# Patient Record
Sex: Male | Born: 1940 | Race: White | Hispanic: No | Marital: Married | Smoking: Current every day smoker
Health system: Southern US, Community
[De-identification: ages and names within clinical notes are randomized; demographics above are authoritative.]

## PROBLEM LIST (undated history)

## (undated) DIAGNOSIS — I739 Peripheral vascular disease, unspecified: Secondary | ICD-10-CM

## (undated) DIAGNOSIS — I779 Disorder of arteries and arterioles, unspecified: Secondary | ICD-10-CM

## (undated) DIAGNOSIS — I2510 Atherosclerotic heart disease of native coronary artery without angina pectoris: Secondary | ICD-10-CM

## (undated) DIAGNOSIS — I10 Essential (primary) hypertension: Secondary | ICD-10-CM

## (undated) DIAGNOSIS — K219 Gastro-esophageal reflux disease without esophagitis: Secondary | ICD-10-CM

## (undated) DIAGNOSIS — E785 Hyperlipidemia, unspecified: Secondary | ICD-10-CM

## (undated) DIAGNOSIS — J449 Chronic obstructive pulmonary disease, unspecified: Secondary | ICD-10-CM

## (undated) HISTORY — DX: Essential (primary) hypertension: I10

## (undated) HISTORY — DX: Peripheral vascular disease, unspecified: I73.9

## (undated) HISTORY — PX: OTHER SURGICAL HISTORY: SHX169

## (undated) HISTORY — PX: FRACTURE SURGERY: SHX138

## (undated) HISTORY — DX: Disorder of arteries and arterioles, unspecified: I77.9

## (undated) HISTORY — DX: Chronic obstructive pulmonary disease, unspecified: J44.9

## (undated) HISTORY — DX: Atherosclerotic heart disease of native coronary artery without angina pectoris: I25.10

## (undated) HISTORY — DX: Hyperlipidemia, unspecified: E78.5

---

## 1998-12-24 ENCOUNTER — Inpatient Hospital Stay (HOSPITAL_COMMUNITY): Admission: AD | Admit: 1998-12-24 | Discharge: 1998-12-26 | Payer: Self-pay | Admitting: Internal Medicine

## 1999-12-22 ENCOUNTER — Inpatient Hospital Stay (HOSPITAL_COMMUNITY): Admission: EM | Admit: 1999-12-22 | Discharge: 1999-12-24 | Payer: Self-pay | Admitting: Internal Medicine

## 2002-01-05 ENCOUNTER — Encounter: Payer: Self-pay | Admitting: Vascular Surgery

## 2002-01-08 ENCOUNTER — Encounter (INDEPENDENT_AMBULATORY_CARE_PROVIDER_SITE_OTHER): Payer: Self-pay | Admitting: Specialist

## 2002-01-08 ENCOUNTER — Inpatient Hospital Stay (HOSPITAL_COMMUNITY): Admission: RE | Admit: 2002-01-08 | Discharge: 2002-01-09 | Payer: Self-pay | Admitting: Vascular Surgery

## 2007-02-18 HISTORY — PX: CARDIAC CATHETERIZATION: SHX172

## 2007-02-23 ENCOUNTER — Ambulatory Visit: Payer: Self-pay | Admitting: Cardiology

## 2007-02-24 ENCOUNTER — Encounter: Payer: Self-pay | Admitting: Physician Assistant

## 2007-03-01 ENCOUNTER — Inpatient Hospital Stay (HOSPITAL_BASED_OUTPATIENT_CLINIC_OR_DEPARTMENT_OTHER): Admission: RE | Admit: 2007-03-01 | Discharge: 2007-03-01 | Payer: Self-pay | Admitting: Cardiology

## 2007-03-01 ENCOUNTER — Ambulatory Visit: Payer: Self-pay | Admitting: Cardiology

## 2007-03-22 ENCOUNTER — Ambulatory Visit: Payer: Self-pay | Admitting: Cardiology

## 2007-08-01 ENCOUNTER — Encounter: Payer: Self-pay | Admitting: Cardiology

## 2007-10-19 ENCOUNTER — Ambulatory Visit: Payer: Self-pay | Admitting: Cardiology

## 2008-04-19 HISTORY — PX: CAROTID ENDARTERECTOMY: SUR193

## 2008-05-08 ENCOUNTER — Ambulatory Visit: Payer: Self-pay

## 2008-06-25 ENCOUNTER — Ambulatory Visit: Payer: Self-pay | Admitting: Vascular Surgery

## 2008-06-27 ENCOUNTER — Ambulatory Visit: Payer: Self-pay | Admitting: Physician Assistant

## 2008-06-27 ENCOUNTER — Encounter: Payer: Self-pay | Admitting: Cardiology

## 2008-07-05 ENCOUNTER — Inpatient Hospital Stay (HOSPITAL_COMMUNITY): Admission: RE | Admit: 2008-07-05 | Discharge: 2008-07-06 | Payer: Self-pay | Admitting: Vascular Surgery

## 2008-07-05 ENCOUNTER — Ambulatory Visit: Payer: Self-pay | Admitting: Vascular Surgery

## 2008-07-05 ENCOUNTER — Encounter: Payer: Self-pay | Admitting: Vascular Surgery

## 2008-07-16 ENCOUNTER — Ambulatory Visit: Payer: Self-pay | Admitting: Vascular Surgery

## 2009-01-07 ENCOUNTER — Ambulatory Visit: Payer: Self-pay | Admitting: Vascular Surgery

## 2009-04-16 ENCOUNTER — Encounter: Payer: Self-pay | Admitting: Cardiology

## 2009-04-17 ENCOUNTER — Encounter: Payer: Self-pay | Admitting: Cardiology

## 2009-07-01 ENCOUNTER — Ambulatory Visit: Payer: Self-pay | Admitting: Vascular Surgery

## 2009-07-24 ENCOUNTER — Encounter: Payer: Self-pay | Admitting: Cardiology

## 2009-08-25 ENCOUNTER — Ambulatory Visit: Payer: Self-pay | Admitting: Cardiology

## 2009-08-25 DIAGNOSIS — F17200 Nicotine dependence, unspecified, uncomplicated: Secondary | ICD-10-CM

## 2009-08-25 DIAGNOSIS — Z9889 Other specified postprocedural states: Secondary | ICD-10-CM

## 2009-08-25 DIAGNOSIS — I739 Peripheral vascular disease, unspecified: Secondary | ICD-10-CM | POA: Insufficient documentation

## 2009-08-25 DIAGNOSIS — E785 Hyperlipidemia, unspecified: Secondary | ICD-10-CM | POA: Insufficient documentation

## 2009-08-25 DIAGNOSIS — I2510 Atherosclerotic heart disease of native coronary artery without angina pectoris: Secondary | ICD-10-CM

## 2010-05-19 NOTE — Assessment & Plan Note (Signed)
Summary: f/u 52yr [place]   Visit Type:  Follow-up Primary Provider:  [name]  CC:  follow-up visit.  History of Present Illness: the patient is a 70 year old male with history of coronary artery disease, peripheral vascular disease, status post bilateral carotid endarterectomies. The patient has a prolonged history of smoking and he continues to do so. He has a chronic cough in symptoms consistent with chronic emphysema/COPD. He uses his Combivent inhaler regularly. He denies however any chest pain palpitations or syncope. He reports no orthopnea PND.  Preventive Screening-Counseling & Management  Alcohol-Tobacco     Smoking Status: current     Smoking Cessation Counseling: yes     Packs/Day: 1 PPD  Current Medications (verified): 1)  Isosorbide Mononitrate Cr 30 Mg Xr24h-Tab (Isosorbide Mononitrate) .... Take 1 Tablet By Mouth Once A Day 2)  Aspir-Trin 325 Mg Tbec (Aspirin) .... Take 1 Tablet By Mouth Once A Day 3)  Simvastatin 40 Mg Tabs (Simvastatin) .... Take 1 Tablet By Mouth Once A Day 4)  Lisinopril 10 Mg Tabs (Lisinopril) .... Take 1 Tablet By Mouth Once A Day 5)  Advair Diskus 250-50 Mcg/dose Aepb (Fluticasone-Salmeterol) .... One Inhalation Two Times A Day 6)  Fish Oil 1000 Mg Caps (Omega-3 Fatty Acids) .... Take 1 Tablet By Mouth Once A Day 7)  Nitrostat 0.4 Mg Subl (Nitroglycerin) .... Use As Directed 8)  Famotidine 20 Mg Tabs (Famotidine) .... Take 1 Tablet By Mouth Once A Day As Needed 9)  Gabapentin 300 Mg Caps (Gabapentin) .... As Needed 10)  Combivent 18-103 Mcg/act Aero (Ipratropium-Albuterol) .... As Needed  Allergies (verified): 1)  ! Advil  Comments:  Nurse/Medical Assistant: The patient's medications and allergies were reviewed with the patient and were updated in the Medication and Allergy Lists. List reviewed.  Past History:  Family History: Last updated: 08/25/2009 noncontributory  Social History: Last updated: 08/25/2009 Tobacco Use - Yes.   Risk  Factors: Smoking Status: current (08/25/2009) Packs/Day: 1 PPD (08/25/2009)  Past Medical History: coronary artery disease Status post stent placement to the right coronary artery in 2000 and 2001 Obstructive coronary artery disease in November 2008 by catheterization Normal LV function Hypertension Hyperlipidemia COPD Carotid artery disease status post bilateral carotid endarterectomy. COPD  Family History: Reviewed history and no changes required. noncontributory  Social History: Tobacco Use - Yes.  Smoking Status:  current Packs/Day:  1 PPD  Review of Systems       The patient complains of shortness of breath and prolonged cough.  The patient denies fatigue, malaise, fever, weight gain/loss, vision loss, decreased hearing, hoarseness, chest pain, palpitations, wheezing, sleep apnea, coughing up blood, abdominal pain, blood in stool, nausea, vomiting, diarrhea, heartburn, incontinence, blood in urine, muscle weakness, joint pain, leg swelling, rash, skin lesions, headache, fainting, dizziness, depression, anxiety, enlarged lymph nodes, easy bruising or bleeding, and environmental allergies.    Vital Signs:  Patient profile:   70 year old male Height:      74 inches Weight:      185 pounds BMI:     23.84 Pulse rate:   71 / minute BP sitting:   120 / 69  (left arm) Cuff size:   large  Vitals Entered By: Carlye Grippe (Aug 25, 2009 9:32 AM) CC: follow-up visit   Physical Exam  Additional Exam:  General: Well-developed, well-nourished in no distress head: Normocephalic and atraumatic eyes PERRLA/EOMI intact, conjunctiva and lids normal nose: No deformity or lesions mouth normal dentition, normal posterior pharynx neck: Supple, no  JVD.  No masses, thyromegaly or abnormal cervical nodes lungs: decreased breath sounds bilaterally without wheezing.  Normal percussion heart: regular rate and rhythm with normal S1 and S2, no S3 or S4.  PMI is normal.  No pathological  murmurs abdomen: Normal bowel sounds, abdomen is soft and nontender without masses, organomegaly or hernias noted.  No hepatosplenomegaly musculoskeletal: Back normal, normal gait muscle strength and tone normal pulsus: Pulse is normal in all 4 extremities Extremities: No peripheral pitting edema neurologic: Alert and oriented x 3 skin: Intact without lesions or rashes cervical nodes: No significant adenopathy psychologic: Normal affect    EKG  Procedure date:  08/25/2009  Findings:      normal sinus rhythm. Normal EKG heart rate 72 beats per minute  Impression & Recommendations:  Problem # 1:  CAD (ICD-414.00) the patient is due for a Cardiolite imaging study next year. His last catheterization was in 2008. He reports no symptoms consistent with angina. His updated medication list for this problem includes:    Isosorbide Mononitrate Cr 30 Mg Xr24h-tab (Isosorbide mononitrate) .Marland Kitchen... Take 1 tablet by mouth once a day    Aspir-trin 325 Mg Tbec (Aspirin) .Marland Kitchen... Take 1 tablet by mouth once a day    Lisinopril 10 Mg Tabs (Lisinopril) .Marland Kitchen... Take 1 tablet by mouth once a day    Nitrostat 0.4 Mg Subl (Nitroglycerin) ..... Use as directed  Orders: EKG w/ Interpretation (93000)  Problem # 2:  PVD (ICD-443.9) the patient will be scheduled for a abdominal ultrasound to rule out aneurysm on his next clinic visit  Problem # 3:  CAROTID ENDARTERECTOMY, BILATERAL, HX OF (ICD-V15.1) the patient's followup increase for a vascular surgery  Problem # 4:  TOBACCO ABUSE (ICD-305.1) I counseled the patient about his tobacco use but he states it is not interested in quitting smoking.  Problem # 5:  DYSLIPIDEMIA (ICD-272.4) follow by Dr. [name]. His updated medication list for this problem includes:    Simvastatin 40 Mg Tabs (Simvastatin) .Marland Kitchen... Take 1 tablet by mouth once a day   Patient Instructions: 1)  Your physician wants you to follow-up in: 1 year. You will receive a reminder letter in  the mail one-two months in advance. If you don't receive a letter, please call our office to schedule the follow-up appointment. 2)  Your physician recommends that you continue on your current medications as directed. Please refer to the Current Medication list given to you today.

## 2010-05-19 NOTE — Letter (Signed)
Summary: [facility] H[facility]P/ D/C DR. PARSONS  [facility] D/C DR. PARSONS   Imported By: Zachary George 08/25/2009 09:26:13  _____________________________________________________________________  External Attachment:    Type:   Image     Comment:   External Document

## 2010-05-19 NOTE — Letter (Signed)
Summary: External Correspondence/ PROGRESS NOTE DR. TYSINGER  External Correspondence/ PROGRESS NOTE DR. TYSINGER   Imported By: Dorise Hiss 07/28/2009 13:52:04  _____________________________________________________________________  External Attachment:    Type:   Image     Comment:   External Document

## 2010-07-14 ENCOUNTER — Other Ambulatory Visit: Payer: Self-pay

## 2010-07-22 ENCOUNTER — Other Ambulatory Visit: Payer: Self-pay | Admitting: Cardiology

## 2010-07-23 ENCOUNTER — Other Ambulatory Visit (INDEPENDENT_AMBULATORY_CARE_PROVIDER_SITE_OTHER): Payer: Medicare Other

## 2010-07-23 DIAGNOSIS — I6529 Occlusion and stenosis of unspecified carotid artery: Secondary | ICD-10-CM

## 2010-07-23 DIAGNOSIS — Z48812 Encounter for surgical aftercare following surgery on the circulatory system: Secondary | ICD-10-CM

## 2010-07-28 NOTE — Procedures (Unsigned)
CAROTID DUPLEX EXAM  INDICATION:  Follow up carotid artery disease.  HISTORY: Diabetes:  No. Cardiac:  MI. Hypertension:  No. Smoking:  Yes. Previous Surgery:  Right carotid endarterectomy, 07/05/2008, left carotid endarterectomy. CV History:  Currently asymptomatic. Amaurosis Fugax No, Paresthesias No, Hemiparesis No.                                      RIGHT             LEFT Brachial systolic pressure:         110               109 Brachial Doppler waveforms:         Normal            Normal Vertebral direction of flow:        Antegrade         Antegrade DUPLEX VELOCITIES (cm/sec) CCA peak systolic                   57                78 ECA peak systolic                   71                64 ICA peak systolic                   113               92 ICA end diastolic                   40                34 PLAQUE MORPHOLOGY:                  Homogenous        Heterogenous PLAQUE AMOUNT:                      Mild              Mild PLAQUE LOCATION:                    Bifurcation       Bifurcation  IMPRESSION: 1. Patent bilateral carotid endarterectomy sites with no evidence of     restenosis of the internal carotid artery. 2. Mild intimal hyperplasia noted at the bilateral carotid     endarterectomy sites. 3. Antegrade flow in bilateral vertebral arteries.  ___________________________________________ Quita Skye [name], M.D.  EM/MEDQ  D:  07/23/2010  T:  07/23/2010  Job:  [id number]

## 2010-07-30 LAB — BLOOD GAS, ARTERIAL
Acid-Base Excess: 1.6 mmol/L (ref 0.0–2.0)
Drawn by: 206361
FIO2: 0.21 %
O2 Saturation: 96 %
Patient temperature: 98.6
pO2, Arterial: 76 mmHg — ABNORMAL LOW (ref 80.0–100.0)

## 2010-07-30 LAB — COMPREHENSIVE METABOLIC PANEL
ALT: 20 U/L (ref 0–53)
AST: 23 U/L (ref 0–37)
Calcium: 9 mg/dL (ref 8.4–10.5)
Creatinine, Ser: 0.78 mg/dL (ref 0.4–1.5)
GFR calc Af Amer: >60 mL/min (ref >60)
Sodium: 138 mEq/L (ref 135–145)
Total Protein: 6.4 g/dL (ref 6.0–8.3)

## 2010-07-30 LAB — CBC
HCT: 39.4 % (ref 39.0–52.0)
Hemoglobin: 13.7 g/dL (ref 13.0–17.0)
MCHC: 35 g/dL (ref 30.0–36.0)
MCV: 94.8 fL (ref 78.0–100.0)
MCV: 95.2 fL (ref 78.0–100.0)
Platelets: 237 10*3/uL (ref 150–400)
RBC: 4.14 MIL/uL — ABNORMAL LOW (ref 4.22–5.81)
RDW: 12.6 % (ref 11.5–15.5)
WBC: 9.1 10*3/uL (ref 4.0–10.5)

## 2010-07-30 LAB — APTT: aPTT: 26 seconds (ref 24–37)

## 2010-07-30 LAB — BASIC METABOLIC PANEL
Chloride: 103 mEq/L (ref 96–112)
GFR calc Af Amer: >60 mL/min (ref >60)
Potassium: 4 mEq/L (ref 3.5–5.1)
Sodium: 138 mEq/L (ref 135–145)

## 2010-07-30 LAB — URINALYSIS, ROUTINE W REFLEX MICROSCOPIC
Bilirubin Urine: NEGATIVE
Ketones, ur: NEGATIVE mg/dL
Nitrite: NEGATIVE
pH: 6.5 (ref 5.0–8.0)

## 2010-07-30 LAB — ABO/RH: ABO/RH(D): O POS

## 2010-07-30 LAB — CROSSMATCH

## 2010-09-01 NOTE — Assessment & Plan Note (Signed)
[facility]                          [name]   Joseph Forbes, Joseph Forbes                      MRN:          [id number]  DATE:03/22/2007                            DOB:          Jul 01, 1940    REFERRING PHYSICIAN:  Wyvonnia Lora   HISTORY OF PRESENT ILLNESS:  The patient is a pleasant 70 year old male  with a history of coronary artery disease.  Please see details regarding  his anatomy described by Rozell Searing, PA-C on February 23, 2007.  The  patient reported symptoms of worsening angina and was referred for a  cardiac catheterization.  This was performed by Dr. Antoine Poche, which  demonstrated residual nonobstructive coronary artery disease with patent  stents and preserved ejection fraction.  The patient states, currently,  that he does not have any recurrent substernal chest pain.  The patient  does have known peripheral vascular disease, including carotid disease  and is status post carotid endarterectomy.  He does not particularly  describe claudication.  However, unfortunately continues to smoke.  He  reports no groin complications related to his catheterization.   MEDICATIONS:  1. Aspirin 325 daily.  2. Simvastatin 40 mg nightly.  3. Neurontin.  4. Lisinopril.  5. Albuterol inhaler.   REVIEW OF SYSTEMS:  The patient does report left leg pain, but only in  the sitting position with no evidence of claudication, consistent with  pseudoclaudication.   PHYSICAL EXAMINATION:  VITAL SIGNS:  Blood pressure 114/66, heart rate  77.  NECK:  Normal carotid upstroke with soft right carotid bruit and soft  left carotid bruit status post left carotid endarterectomy.  HEART:  Regular rate and rhythm.  Normal S1 and S2.  LUNGS:  Clear breath sounds bilaterally with scattered wheezes at the  base.  ABDOMEN:  Soft.  EXTREMITIES:  No cyanosis, clubbing, or edema.   PROBLEM LIST:  1. Coronary artery disease, see details per prior note.  2.  Status post recent catheterization with nonobstructive disease.  3. Preserved left ventricular systolic function.  4. Chronic obstructive pulmonary disease with ongoing tobacco use.  5. Peripheral vascular disease status post carotid endarterectomy.  6. History of transient ischemic attack.  7. History of hypertension.  8. Dyslipidemia.   PLAN:  1. I do not think the patient describes definite claudication.  He      also has palpable pulses.  I do not think he needs a further      evaluation for lower extremity vascular disease.  2. The patient did not have carotid Dopplers done in many years and is      due for followup, and this has been arranged.  3. From a cardiac perspective, the patient is stable and reports no      recurrent substernal chest pain.  4. P.r.n. nitroglycerin available, which she has not had to take.  The      patient can be continued on medical treatment and risk factor      modification.  5. The patient will follow up with [place] in 6 months.     Learta Codding, MD,FACC  Electronically Signed    GED/MedQ  DD: 03/22/2007  DT: 03/22/2007  Job #: [id number]   cc:   Wyvonnia Lora

## 2010-09-01 NOTE — Cardiovascular Report (Signed)
NAMEFERNIE, GRIMM NO.:  [id number]   MEDICAL RECORD NO.:  [id number]          PATIENT TYPE:  OIB   LOCATION:  NA                           FACILITY:  [facility]   PHYSICIAN:  Rollene Rotunda, MD, FACCDATE OF BIRTH:  20-Jun-1940   DATE OF PROCEDURE:  03/01/2007  DATE OF DISCHARGE:                            CARDIAC CATHETERIZATION   PRIMARY CARE PHYSICIAN:  Dr. Wyvonnia Lora.  Cardiologist, Dr. Learta Codding.   PROCEDURE:  Left heart catheterization/coronary arteriography.   INDICATIONS:  This is a patient with coronary disease and previous  stenting to his right coronary artery.  He has had chest pain consistent  with unstable angina.   PROCEDURE NOTE:  Left heart catheterization was performed of the right  femoral artery. The artery was cannulated using anterior wall puncture.  A #4-French arterial sheath was inserted via the modified Seldinger  technique.  Preformed Judkins and pigtail catheter were utilized.  The  patient tolerated the procedure well and left the lab in stable  condition.   RESULTS AND HEMODYNAMICS:  LV 26/10, AO 115/55.  Coronaries, left main  was normal.  The LAD was calcified from the ostium down to the mid  segment.  There was an ostial 40-50% lesion.  There was long proximal 30-  40% stenosis.  First and second diagonals were moderate sized and  relatively normal without high-grade lesions.  The circ in the AV groove  had ostial 30-40% stenosis.  There was mid obtuse marginal which was  moderate sized and normal.  Second mid obtuse marginal was moderate  sized and normal.  The right coronary artery is a very large dominant  vessel.  There were proximal and mid stents.  There was mild luminal  irregularities.  The PDA was moderate sized and normal.  Left  ventriculogram exam was obtained in the RAO projection.  The EF was 55%  with normal wall motion.   CONCLUSION:  Residual nonobstructive coronary artery disease.  Patent  stents.   Well-preserved ejection fraction.   PLAN:  The patient will continue to have aggressive secondary risk  reduction.  I will set up  a follow-up appointment with Dr. Andee Lineman.  I  did give these results to Dr. [name].      Rollene Rotunda, MD, [facility]  Electronically Signed     JH/MEDQ  D:  03/01/2007  T:  03/02/2007  Job:  [id number]   cc:   Ellyn Hack, MD,FACC

## 2010-09-01 NOTE — Procedures (Signed)
CAROTID DUPLEX EXAM   INDICATION:  Follow up known right internal carotid artery disease.   HISTORY:  Diabetes:  No.  Cardiac:  Yes.  Hypertension:  No.  Smoking:  Yes.  Previous Surgery:  Left carotid endarterectomy.  CV History:  Amaurosis Fugax No, Paresthesias No, Hemiparesis No.                                       RIGHT             LEFT  Brachial systolic pressure:         115  Brachial Doppler waveforms:  Vertebral direction of flow:        Antegrade  DUPLEX VELOCITIES (cm/sec)  CCA peak systolic                   74  ECA peak systolic                   261  ICA peak systolic                   149  ICA end diastolic                   55  PLAQUE MORPHOLOGY:                  Calcified  PLAQUE AMOUNT:                      Moderate  PLAQUE LOCATION:                    ICA, ECA   IMPRESSION:  1. Limited study.  2. 40-59% stenosis with questionable ulcerated plaque noted in the      right internal carotid artery.  3. Antegrade right vertebral artery.   ___________________________________________  Quita Skye [name], M.D.   MG/MEDQ  D:  06/25/2008  T:  06/25/2008  Job:  [id number]

## 2010-09-01 NOTE — Discharge Summary (Signed)
Joseph Forbes, Joseph Forbes               ACCOUNT NO.:  [id number]   MEDICAL RECORD NO.:  [id number]          PATIENT TYPE:  INP   LOCATION:  3313                         FACILITY:  [facility]   PHYSICIAN:  Quita Skye. [name], M.D.  DATE OF BIRTH:  [birth year]   DATE OF ADMISSION:  07/05/2008  DATE OF DISCHARGE:  07/06/2008                               DISCHARGE SUMMARY   FINAL DISCHARGE DIAGNOSES:  1. Symptomatic right internal carotid artery stenosis.  2. Chronic obstructive pulmonary disease.  3. Hypertension.  4. Dyslipidemia.   PROCEDURES PERFORMED:  Right carotid endarterectomy with Dacron patch  angioplasty closure on July 05, 2008, by Dr. [name].   COMPLICATIONS:  None.   DISCHARGE CONDITION:  Stable, improving.   DISCHARGE MEDICATIONS:  He is instructed to resume all previous  medications consisting of:  1. Aspirin 37.5 mg p.o. q.a.m.  2. Advair 2 puffs a.m. and p.m.  3. Isosorbide 30 mg p.o. q.a.m.  4. Pepcid 20 mg p.o. q.a.m.  5. Simvastatin 40 mg p.o. q.p.m.  6. Lisinopril 10 mg p.o. q.a.m.  7. Neurontin 300 mg p.r.n.  8. Fish oil 1000 mg p.o. q.a.m.  9. Percocet 5/325 one p.o. q.4 h. p.r.n. pain, total #20 were given.   DISPOSITION:  He is being discharged home in stable condition with his  wounds healing well.  He did receive careful instructions regarding the  care of his wounds and activity level.  He was given an appointment to  see Dr. [name] in 2 weeks for continuing followup.  The office will  arrange a visit.   Brief identifying statement of complete details, please refer the typed  history and physical.  Briefly, this very pleasant 70 year old man was  referred to Dr. [name] for carotid occlusive disease.  Dr. [name]  recommended right carotid endarterectomy for stroke prevention.  He was  informed of the risks and benefits of the procedure and after careful  consideration he elected to proceed with surgery.   HOSPITAL COURSE:  Preoperative workup was completed  as an outpatient.  He was brought in through Same-Day Surgery and underwent the  aforementioned right carotid endarterectomy.  For complete details,  please refer the typed operative report.  The procedure was without  complications.  He was returned to the Postanesthesia Care Unit  extubated.  Following stabilization, he was transferred to a bed on a  surgical step-down unit.  He was observed overnight and the following  morning was found to be in neurologically stable condition.  He was  desirous of discharge and was discharged home in stable condition.      Wilmon Arms, PA      Quita Skye [name], M.D.  Electronically Signed    KEL/MEDQ  D:  07/06/2008  T:  07/06/2008  Job:  [id number]

## 2010-09-01 NOTE — Procedures (Signed)
CAROTID DUPLEX EXAM   INDICATION:  Bilateral carotid endarterectomies.   HISTORY:  Diabetes:  No.  Cardiac:  Yes.  Hypertension:  No.  Smoking:  Yes.  Previous Surgery:  Right carotid endarterectomy on 07/05/2008, history  of left carotid endarterectomy.  CV History:  Currently asymptomatic.  Amaurosis Fugax No, Paresthesias No, Hemiparesis No                                       RIGHT             LEFT  Brachial systolic pressure:         122               122  Brachial Doppler waveforms:         Normal            Normal  Vertebral direction of flow:        Antegrade         Antegrade  DUPLEX VELOCITIES (cm/sec)  CCA peak systolic                   79                121  ECA peak systolic                   104               70  ICA peak systolic                   114               82  ICA end diastolic                   40                22  PLAQUE MORPHOLOGY:                                    Heterogeneous  PLAQUE AMOUNT:                      None              Mild  PLAQUE LOCATION:                                      CCA   IMPRESSION:  1. Patent bilateral carotid endarterectomies with no evidence of      bilateral internal carotid artery stenoses.  2. Mild intimal hyperplasia is noted at the bilateral carotid      endarterectomy sites.   ___________________________________________  Quita Skye [name], M.D.   CH/MEDQ  D:  01/07/2009  T:  01/07/2009  Job:  [id number]

## 2010-09-01 NOTE — Op Note (Signed)
NAMEREINHART, SAULTERS               ACCOUNT NO.:  [id number]   MEDICAL RECORD NO.:  [id number]          PATIENT TYPE:  INP   LOCATION:  3313                         FACILITY:  [facility]   PHYSICIAN:  Quita Skye. [name], M.D.  DATE OF BIRTH:  July 16, 1940   DATE OF PROCEDURE:  07/05/2008  DATE OF DISCHARGE:                               OPERATIVE REPORT   PREOPERATIVE DIAGNOSES:  Severe right internal carotid stenosis with  recent right brain transient ischemic attacks.   POSTOPERATIVE DIAGNOSES:  Severe right internal carotid stenosis with  recent right brain transient ischemic attacks.   OPERATION:  Right carotid endarterectomy with Dacron patch angioplasty.   SURGEON:  Quita Skye. [name], MD   FIRST ASSISTANT:  Wilmon Arms, PA   ANESTHESIA:  General endotracheal.   BRIEF HISTORY:  This patient has previously undergone left carotid  endarterectomy and has recently had some episodes of blurred vision in  the right eye as well as some transient weakness on the left side.  He  was found to have a moderately severe right internal carotid stenosis  and was scheduled for right carotid endarterectomy for this symptomatic  lesion.   PROCEDURE IN DETAIL:  The patient was taken to the operating room,  placed in the supine position at which time satisfactory general  endotracheal anesthesia was administered.  The right neck was prepped  with Betadine scrub and solution, draped in routine sterile manner.  Incision was made along the anterior border of the sternocleidomastoid  muscle and carried down through subcutaneous tissue and platysma using  the Bovie.  The common facial vein and external jugular vein were  ligated with 3-0 silk ties, divided exposing the common internal and  external carotid arteries.  Care was taken not to injure the vagus or  hypoglossal nerves, both of which were exposed.  There was calcified  atherosclerotic plaque at the carotid bifurcation extending up the  internal  carotid artery distally to the hypoglossal nerve.  This was all  exposed #10 shunt was prepared and the patient was heparinized.  Carotid  vessels were occluded with vascular clamps longitudinal opening made in  the common carotid with 15 blade extended up to the internal carotid  with Potts scissors to a point distal to the disease.  The plaque was  about 80% stenotic in severity and was ulcerated posteriorly.  #10 shunt  was inserted without difficulty reestablishing flow in about 2 minutes.  Standard endarterectomy was then performed using elevator and Potts  scissors with eversion endarterectomy of the external carotid.  The  plaque feathered off the distal internal carotid artery nicely not  requiring any tacking sutures.  Lumen was thoroughly irrigated with  heparin saline.  All loose debris carefully removed and arteriotomy was  closed with a patch using continuous 6-0 Prolene.  Prior to completion  of the closure, shunt was removed after about 30 minutes shunt time.  Following antegrade and retrograde flushing, closure was completed  reestablishing the flow initially up the external and up the internal  branch.  Carotid was occluded for less than 2 minutes for  removal of the  shunt.  Protamine was then given to reverse the heparin.  Following  adequate hemostasis, wound was irrigated with saline, closed in layers  with Vicryl in subcuticular fashion.  Sterile dressing applied.  The  patient taken to recovery in satisfactory condition.      Quita Skye [name], M.D.  Electronically Signed     JDL/MEDQ  D:  07/05/2008  T:  07/05/2008  Job:  [id number]

## 2010-09-01 NOTE — H&P (Signed)
HISTORY AND PHYSICAL EXAMINATION   June 25, 2008   Re:  Joseph Forbes, Joseph Forbes               DOB:  1940/06/09   History and physical/vascular surgery consultation.   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   HISTORY OF PRESENT ILLNESS:  The 70 year old male patient has a history  of previous left carotid endarterectomy performed by Dr. [name] several  years ago.  He has had moderate right carotid occlusive disease followed  which has been asymptomatic.  Recently he reports a few episodes of  spontaneous onset of blurred or absent vision in the right eye lasting  only a few minutes as well as a few episodes of numbness in the left  upper extremity.  He has had no episodes of paralysis.  His wife states  he has had some spells where his speech is slightly slurred.  He also  reports dizziness with no syncope.   Carotid duplex exam performed at [facility] revealed moderate  right carotid occlusive disease with an irregular plaque.  This was  repeated on the right side at [facility] on March 9 which revealed an ulcerated  right carotid plaque at the bifurcation with a moderate stenosis and  widely patent left carotid endarterectomy site.   PAST MEDICAL HISTORY:  1. Coronary artery disease with previous myocardial infarction 10      years ago, previous PTCA and stenting on two occasions over the      past 10 years followed by Dr. Andee Lineman.  2. Hyperlipidemia.  3. COPD.  4. Negative for diabetes, hypertension or previous stroke.   PAST SURGICAL HISTORY:  Left carotid endarterectomy.   FAMILY HISTORY:  Positive for coronary artery disease in his mother and  father who had coronary artery bypass grafting on two occasions,  negative for diabetes and stroke.   SOCIAL HISTORY:  He is married, is retired and has smoked a pack a day  for 50+ years of cigarettes, does not use alcohol.   REVIEW OF  SYSTEMS:  Denies any chest pain but does have dyspnea on  exertion and chronic wheezing on occasion.  Denies any claudication  symptoms, able to ambulate long distances, does have occasional  dizziness, has diffuse arthritis and decreased hearing.   ALLERGIES:  None known.   MEDICATIONS:  1. Aspirin 325 mg one daily.  2. Advair HFA 115/21 puffs by mouth every 12 hours.  3. Isosorbide 30 mg one daily.  4. Famotidine 20 mg p.r.n. indigestion.  5. Simvastatin 40 mg q.h.s.  6. Lisinopril 10 mg in the morning.  7. Gabapentin 300 mg as needed.  8. Fish oil 1000 mg daily.   PHYSICAL EXAMINATION:  Blood pressure 115/66, heart rate 82,  respirations 14.  Generally he is a male patient who is in no apparent  distress, alert and oriented x3.  Neck is supple, 3+ carotid pulses  palpable.  There is a soft bruit on the right.  No bruit on the left.  Neurologic exam is normal.  No palpable adenopathy in the neck.  Chest  clear to auscultation.  Cardiovascular exam reveals regular rhythm.  No  murmurs.  Abdomen soft, nontender with no masses.  Extremity exam  reveals 3+ femoral, popliteal, dorsalis pedis and posterior tibial  pulses bilaterally.   IMPRESSION:  1. Moderate right carotid stenosis with ulcerative plaque and possible      right brain  transient ischemic attacks and amaurosis fugax.  2. Coronary artery disease currently stable to be evaluated and      cleared by Dr. Andee Lineman preoperatively, status post PTCA and stenting      many years ago.  3. Hyperlipidemia.   PLAN:  Is to admit the patient on March 19 for an elective right carotid  endarterectomy.  Risks and benefits have been thoroughly discussed with  the patient and he would like to proceed and will be seen preoperatively  by Dr. Andee Lineman.   Quita Skye [name], M.D.  Electronically Signed   JDL/MEDQ  D:  06/25/2008  T:  06/26/2008  Job:  [id number]   cc:   Learta Codding, MD,FACC  Wyvonnia Lora

## 2010-09-01 NOTE — Assessment & Plan Note (Signed)
[facility]                          [name]   HALE, CHALFIN                      MRN:          [id number]  DATE:06/27/2008                            DOB:          1940-10-29    PRIMARY CARDIOLOGIST:  Learta Codding, MD, [facility].   PRIMARY CARE PHYSICIAN:  Dr. Wyvonnia Lora.   REASON FOR VISIT:  Preoperative evaluation.   HISTORY OF PRESENT ILLNESS:  Mr. Durnin is a pleasant 70 year old  gentleman followed by Dr. Andee Lineman with a history of multivessel coronary  artery disease status post previous percutaneous coronary intervention  with stent placement to the right coronary artery in 2000 and 2001,  noted to be widely patent with otherwise nonobstructive atherosclerosis  by cardiac catheterization in November 2008.  He has normal left  ventricular systolic function, hypertension, hyperlipidemia, chronic  obstructive pulmonary disease with ongoing tobacco abuse, and carotid  artery disease status post previous left carotid endarterectomy in 2003.  He is now referred for a preoperative assessment prior to elective right  carotid endarterectomy.  He has had some episodes of blurry vision in  the right eye lasting for a few minutes as well as some numbness in his  left upper extremity and some speech slurring.  He had a followup  carotid duplex done in January 2010 demonstrating now 60-79% of right  internal carotid artery stenosis.  From a cardiac perspective, he denies  any significant anginal chest pain or nitroglycerin use.  He has had no  heart failure symptoms.  His electrocardiogram today is normal showing  sinus rhythm at 64 beats per minute.   ALLERGIES:  ALEVE.   MEDICATIONS:  1. Aspirin 325 mg p.o. daily.  2. Simvastatin 40 mg p.o. at bedtime.  3. Lisinopril 10 mg p.o. daily.  4. Advair 1 puff b.i.d.  5. Imdur 30 mg p.o. daily.  6. Omega-3 supplements 1000 mg p.o. daily.  7. Sublingual nitroglycerin 0.4 mg  p.r.n.  8. Famotidine p.r.n.  9. Gabapentin p.r.n.   REVIEW OF SYSTEMS:  As outlined above.  He had no recent exacerbation of  chronic lung disease.  No recent hospitalizations.  No palpitations or  syncope.  Otherwise negative.   PHYSICAL EXAMINATION:  VITAL SIGNS:  Blood pressure is 127/68, heart  rate is 64, weight is 183 pounds, which is up from 177 in July 2009.  GENERAL:  He is comfortable in no acute distress.  HEENT:  Conjunctiva is normal.  Oropharynx is clear.  NECK:  Supple.  No elevated jugular venous pressure is noted.  There is  a left carotid endarterectomy scar noted.  Soft right carotid bruit.  No  thyromegaly.  LUNGS:  Clear with diminished breath sounds, slight end-expiratory  squeak and wheeze.  Respiratory effort is nonlabored at rest.  CARDIAC:  Regular rate and rhythm.  No S3 gallop or pericardial rub.  ABDOMEN:  Soft, nontender with good bowel sounds.  EXTREMITIES:  No frank pitting edemas.  Pulses are 1+.  SKIN:  Warm and dry.  MUSCULOSKELETAL:  No kyphosis noted.  NEUROPSYCHIATRIC:  The patient is alert and  oriented x3.  Affect is  appropriate.   Cardiac catheterization report from November 2008 was reviewed.  Ejection fraction was 55% with normal wall motion at that time.  Right  coronary stent sites were patent with mild luminal irregularities.  There were 40-50% stenoses of the left anterior descending and 30-40%  stenoses within the circumflex.  These were managed medically.   IMPRESSION AND RECOMMENDATIONS:  Preoperative evaluation in a 68-year-  old gentleman with coronary artery disease status post previous stent  placement in the right coronary artery and otherwise residual  nonobstructive disease of the left anterior descending and circumflex.  He has normal ejection fraction and documentation of patent stents  without progressive atherosclerosis by angiography within the last 2  years.  He is symptomatically stable without angina or heart  failure  symptoms and his resting electrocardiogram is normal.  Medical regimen  was reviewed and is reasonable.  Blood pressure and heart rate are well  controlled.  He is being considered for an elective right carotid  endarterectomy and I would anticipate that he should be able to proceed  with a acceptable perioperative cardiac risk.  No clear need for further  ischemic evaluation at this time based on above.  We can see Mr. Ballin  if needed during his evaluation at [facility].  Otherwise, he  will return to see Dr. Andee Lineman over the next 6 months for additional risk  factor modification and followup.  We did discuss smoking cessation  today.     Jonelle Sidle, MD  Electronically Signed    SGM/MedQ  DD: 06/27/2008  DT: 06/28/2008  Job #: [id number]   cc:   Learta Codding, MD,FACC  Lajuana Matte [name], M.D.

## 2010-09-01 NOTE — Procedures (Signed)
CAROTID DUPLEX EXAM   INDICATION:  Follow up carotid artery disease.   HISTORY:  Diabetes:  No.  Cardiac:  MI.  Hypertension:  No.  Smoking:  Yes.  Previous Surgery:  Right carotid endarterectomy, 07/05/08, left carotid  endarterectomy.  CV History:  No.  Amaurosis Fugax No, Paresthesias No, Hemiparesis No.                                       RIGHT             LEFT  Brachial systolic pressure:         106               110  Brachial Doppler waveforms:         WNL               WNL  Vertebral direction of flow:        Antegrade         Antegrade  DUPLEX VELOCITIES (cm/sec)  CCA peak systolic                   88                97  ECA peak systolic                   178               141  ICA peak systolic                   81                79  ICA end diastolic                   30                28  PLAQUE MORPHOLOGY:                  Heterogenous      Heterogenous  PLAQUE AMOUNT:                      Mild              Mild  PLAQUE LOCATION:                    ECA               ECA   IMPRESSION:  1. Patent bilateral carotid endarterectomy sites with no evidence of      any stenosis.  2. Mild intimal hyperplasia noted at the bilateral carotid      endarterectomy sites.  3. Antegrade flow in bilateral vertebrals.   ___________________________________________  Quita Skye [name], M.D.   CB/MEDQ  D:  07/01/2009  T:  07/01/2009  Job:  [id number]

## 2010-09-01 NOTE — Assessment & Plan Note (Signed)
[facility]                          [name]   THAYER, EMBLETON                      MRN:          [id number]  DATE:10/19/2007                            DOB:          1940-08-23    PRIMARY CARDIOLOGIST:  Learta Codding, MD,FACC   REASON FOR VISIT:  Scheduled clinic followup.   Mr. Arizpe continues to do well from a cardiovascular standpoint, with  no interim development of signs/symptoms suggestive of unstable angina  pectoris.  His most recent cardiac catheterization, in November 2008,  revealed nonobstructive CAD with widely patent proximal and mid RCA  stents.  LVEF was preserved.   The patient also does not suggest any symptoms of intermittent  claudication.  Of note, he does continue to smoke, however, but states  that he is working hard to stop this on his own.  He apparently has  tried multiple modalities in the past, including Chantix and nicotine  patches.   Mr. Plemmons does report some positional dizziness.  This occurs either  when standing up abruptly or when tilting his head up from a standing  position.  He does not describe vertigo-like symptoms.  There was no  frank syncope.   Mr. Dunagan does have CVD and is status post left carotid endarterectomy  in 2003.  Most recent surveillance carotid Dopplers, from this past  December, suggested 50-69% right ICA and less than 50% left ICA  stenosis.   CURRENT MEDICATIONS:  1. Full-dose aspirin.  2. Simvastatin 40 nightly.  3. Lisinopril 10 daily.  4. Albuterol inhaler p.r.n.  5. Famotidine 20 daily.  6. Neurontin 1200 nightly.  7. Meloxicam 15 daily.   PHYSICAL EXAMINATION:  Blood pressure 120/64, pulse 68 and regular, and  weight 177.  GENERAL:  A 70 year old male, sitting upright, in no distress.  HEENT:  Normocephalic, atraumatic.  NECK:  Palpable carotid pulses without bruits; no JVD; left CEA scar.  LUNGS:  Diminished breath sounds at bases, with faint  expiratory wheezes  in RLL.  HEART:  RRR (S1S2).  No significant murmurs.  ABDOMEN:  Soft, nontender, and intact bowel sounds.  No pulsatile  epigastric mass.  EXTREMITIES:  Palpable peripheral pulses without edema.  NEURO:  Flat affect, but no focal deficit.   IMPRESSION:  1. Multivessel CAD.      a.     Nonobstructive CAD with widely patent RCA stents by cardiac       catheterization, November 2008.      b.     Status post new, 100% mid RCA stenosis in 2001.      c.     Status post MI/stenting of RCA in 2000.  2. CVD.      a.     Left carotid endarterectomy in 2003.  3. History of transient ischemic attack.  4. Hypertension.  5. Dyslipidemia.  6. COPD/longstanding tobacco.  7. Easy bruisability.  8. Positional dizziness.   PLAN:  1. Down titrate aspirin initially to 162 daily, and 81 mg      indefinitely.  The patient does suggest easy bruisability on full-  dose aspirin.  2. Schedule annual surveillance carotid Dopplers, this December, in      our [place] office.  The patient has expressed a preference to      have these studies done in our main office in [place].  3. Request most recent fasting lipid profile from Dr. Jackolyn Confer office.      Continued aggressive lipid management is recommended with target      LDL of 70, or less.  4. The patient once again advised to stop smoking tobacco.  He appears      to be motivated to do so on his own.  5. Schedule return clinic follow-up with myself and Dr. Andee Lineman in 6      months, or sooner as needed.      Gene Serpe, PA-C  Electronically Signed      Learta Codding, MD,FACC  Electronically Signed   GS/MedQ  DD: 10/19/2007  DT: 10/20/2007  Job #: [id number]

## 2010-09-01 NOTE — Assessment & Plan Note (Signed)
OFFICE VISIT   Joseph Forbes, Joseph Forbes  DOB:  03-Dec-1940                                       01/07/2009  ZOXWR#:[id number]   This patient underwent a right carotid endarterectomy by me in March of  this year for severe right internal carotid stenosis and recent right  brain TIAs.  He has had no recurrent episodes of amaurosis fugax or  transient weakness in the left arm.  He has had a few episodes of  dizziness, particularly when he looks up.  He has had no chest pain but  does have chronic dyspnea on exertion.  Lower extremities get weak after  walking about a half mile but his breathing limits him as much as his  hips.  Takes one aspirin per day.   PHYSICAL EXAMINATION:  Blood pressure of 111/66, heart rate 62,  respirations 14.  Carotid pulses are 3+, no audible bruits.  Neurologic:  Exam is normal.  Chest:  Clear to auscultation.  Cardiovascular:  Regular rhythm, no murmurs.  Abdomen:  Soft, nontender with no masses.  Has 2+ posterior tibial pulses bilaterally.   Carotid duplex reveals widely patent internal carotids bilaterally with  no evidence of restenosis or intimal hyperplasia.   I have reassured him regarding these findings.  We will continue to  follow him on the carotid protocol unless he has any symptoms in the  interim.  Return in 6 months.   Quita Skye [name], M.D.  Electronically Signed   JDL/MEDQ  D:  01/07/2009  T:  01/08/2009  Job:  [id number]

## 2010-09-01 NOTE — Assessment & Plan Note (Signed)
OFFICE VISIT   Joseph Forbes, Joseph Forbes  DOB:  01-24-1941                                       07/16/2008  ZOXWR#:[id number]   This is a postoperative visit.  The patient returns today for initial  follow up regarding his right carotid endarterectomy which I performed  on July 05, 2008, for severe right internal carotid stenosis and recent  right brain TIAs.  He had some transient weakness on the left side as  well as some blurred vision in the right eye preoperatively.  He has had  no recurrence of this since his surgery.  He had an unremarkable  postoperative course but has had some nausea since he was discharged  with 2 episodes of vomiting in the past few weeks.  He has not vomited  in the last few days and has had no abdominal pain.  He has not taken  pain medications since initial discharge from the hospital.  He has had  no change in bowel habits or other specific complaints.  He denies any  neurologic symptoms.   PHYSICAL EXAMINATION:  Blood pressure 114/48, heart rate 65,  respirations 14.  Right neck incision is healing nicely.  Carotid pulses  are 3+ with no bruits audible.  Neurologic exam is normal.  Chest is  clear to auscultation.  His abdomen is soft and nontender.  No rebound  or other abnormalities.   ASSESSMENT:  Reassured him regarding his findings and have given him a  prescription for Phenergan 25 mg q.8 hours to take for the nausea.  I  have given him the okay to drive an automobile as long as he is not  taking the Phenergan.  If he continues to have nausea over long term, he  will be in touch with his medical doctor.  I plan to see him in 6 months  for a follow up carotid Duplex exam unless he has any neurologic  symptoms in the interim.   Quita Skye [name], M.D.  Electronically Signed   JDL/MEDQ  D:  07/16/2008  T:  07/17/2008  Job:  [id number]   cc:   Learta Codding, MD,FACC  Wyvonnia Lora
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
## 2010-09-04 NOTE — Discharge Summary (Signed)
[place]  Patient:    Joseph Forbes, Joseph Forbes                      MRN: [id number] Adm. Date:  [id number] Disc. Date: 12/24/99 Attending:  Pricilla Riffle Dictator:   Tereso Newcomer, P.A.                           Discharge Summary  DATE OF BIRTH:  [birth year]  ADDENDUM:  The patient is agreeable to starting Wellbutrin therapy at discharge for smoking cessation.  He has been prescribed Wellbutrin SR 150 mg p.o. q.d. x 7 days and one p.o. b.i.d. #60 one refill.  He has been counseled again on smoking cessation prior to discharge.  His appointments for follow-up have been changed.  His appointment with Dr. [name] remains on January 05, 2000, at 1:30 p.m.  Dr. Jens Som will see him in the [facility] on September 17, at 2:45 p.m. DD:  12/24/99 TD:  12/24/99 Job: [id number]
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
## 2010-09-04 NOTE — Op Note (Signed)
NAMEUSBALDO, Joseph Forbes                         ACCOUNT NO.:  [id number]   MEDICAL RECORD NO.:  [id number]                   PATIENT TYPE:  INP   LOCATION:  3302                                 FACILITY:  [facility]   PHYSICIAN:  Quita Skye. [name], M.D.               DATE OF BIRTH:  1941-02-09   DATE OF PROCEDURE:  01/08/2002  DATE OF DISCHARGE:  01/09/2002                                 OPERATIVE REPORT   PREOPERATIVE DIAGNOSIS:  Moderately severe stenosis of left internal carotid  artery with ulcerative plaque and recent left hemispheric transient ischemic  attacks.   POSTOPERATIVE DIAGNOSIS:  Moderately severe stenosis of left internal  carotid artery with ulcerative plaque and recent left hemispheric transient  ischemic attacks.   OPERATION:  Left carotid endarterectomy with Dacron patch angioplasty.   SURGEON:  Dr. [name].   FIRST ASSISTANT:  Dr. Arbie Cookey.   SECOND ASSISTANT:  Loura Pardon.   ANESTHESIA:  General endotracheal.   BRIEF HISTORY:  This patient has been followed with moderate left carotid  occlusive disease and last week suffered a left brain TIA consisting of  slurred speech for 24 hours. He then had a second TIA with numbness in his  right upper extremity which resolved. He is now found to have a very  irregular and ulcerated plaque in his left carotid bifurcation with a  moderate stenosis, and he is scheduled for left carotid endarterectomy.   PROCEDURE:  The patient was taken to the operating room and placed in a  supine position at which time satisfactory general endotracheal anesthesia  was administered. The left neck was prepped with Betadine scrub solution and  draped in a routine sterile manner. Incision was made along the anterior  border of the sternocleidomastoid muscle and down through subcutaneous  tissue ____________ using a Bovie. The common facial vein and external  jugular vein were ligated with 3-0 silk ties and divided, exposing the  common  internal and external carotid arteries. Care was taken not to injure  the vagus or hypoglossal nerves, both of which were exposed. There was a  calcified atherosclerotic plaque and the carotid bifurcation extending up  the internal carotid artery about 3 cm. Distal vessel appeared normal. A #10  shunt was prepared, and the patient was heparinized. Carotid vessels were  occluded, and vascular clamps launched in loading mode and the common  carotid with a 15 blade extended up the internal carotid with the Potts  scissors to a point distal to the disease. The plaque was severely ulcerated  posteriorly and moderate stenotic, approximately 60% to 70% in severity. A  #10 shunt was inserted without difficulty, reestablishing flow in about 2  minutes. A standard endarterectomy was then performed using the elevator and  the Potts scissors with an inversion endarterectomy at the external carotid.  The plaque feathered off the distal internal carotid artery nicely, not  requiring any tacking sutures.  Lumen was thoroughly irrigated with heparin  saline, and all loose debris carefully removed. The arterectomy was closed  with a patch using continuous 6-0 Prolene. Prior to completion of the  closure, the shunt was removed after about 30 minutes of shunt time,  following antegrade and retrograde flushing, the closure was completed 3-0  _____________ at the external and at the internal branch.  Carotid was occluded for less than 2 minutes for removal of the shunt.  Protamine was then given to reverse the heparin following adequate  hemostasis. Wound was irrigated with saline, closed in layers with Vicryl in  subcuticular fashion, sterile dressing applied, and patient to recovery room  in satisfactory condition.                                                Quita Skye [name], M.D.    JDL/MEDQ  D:  01/08/2002  T:  01/10/2002  Job:  [id number]

## 2010-09-04 NOTE — Cardiovascular Report (Signed)
[place]. [facility]  Patient:    Joseph Forbes, Joseph Forbes                      MRN: [id number] Proc. Date: 12/22/99 Adm. Date:  [id number] Attending:  Dietrich Pates V CC:         CV Laboratory  Wyvonnia Lora, M.D.  Madolyn Frieze Jens Som, M.D. [facility]   Cardiac Catheterization  INDICATIONS:  Joseph Forbes is a delightful 70 year old gentleman who has had a prior history of stunting of the right coronary artery.  He initially quit smoking, but has resumed smoking and now presents with recurrent chest pain and positive enzymes.  He is brought to the cath lab for further evaluation.  PROCEDURE: 1. Left heart catheterization. 2. Selective coronary arteriography. 3. Selective left ventriculography. 4. Subclavian angiography. 5. PTCA and stenting of the mid right coronary artery utilizing a 3 mm 18 mm    length AVE S7 stent.  DESCRIPTION OF PROCEDURE:  The procedure was performed initially from the right femoral artery using a 6 French sheath.  Views of the left and right coronary arteries were obtained in multiple angiographic projections.  A subclavian image was obtained as well as ventriculography in the RAO projection.  Following this, I called Dr. Loraine Leriche Pulsipher to the room and Dr. Gerri Spore and I carefully reviewed the films.  There was some suggestion of progression of disease in the proximal LAD, but the stent sites were widely patent and there was a new occlusion distally.  Because there was no flow-limiting disease in the left system, it was felt that a repeat percutaneous approach would be appropriate as there was a new lesion in the distal portion of the mid right coronary.  I discussed this with the patient and subsequently with his family.  We proceeded on with percutaneous intervention.  The 6 French sheath in the right femoral artery was replaced with a 7 [place] sheath.  The right femoral vein was entered and a 6 French sheath was placed there.  A 5 French  temporary pacing wire was then passed to the RV apex and found to be appropriate.  We then used a JR4 guiding catheter with appropriate heparin and integrilin on board.  We attempted to cross using a [place] 14 Hi-Torque floppy wire.  We were unable to cross with the floppy wire, but were able to cross with a 0.014 Hi-Torque intermediate wire.  We then used a 2 mm Maverick balloon to reestablish a channel.  Following intracoronary nitroglycerin, we then placed an 18 mm length 3 mm AVE S7 stent. This was taken up to about 13 atmospheres.  There was marked improvement in the appearance of the artery.  There was a slight stepup and stepdown with a negative stenosis at the site of total occlusion.  There was excellent Timi 3 flow at the completion of the procedure without evidence of edge dissection.  All catheters were subsequently removed and the femoral sheath sewn into place.  He was taken to the holding area in satisfactory condition.  HEMODYNAMIC DATA:  The initial central aortic pressure was 126/66.  LV pressure was 118/21.  There was no gradient on pullback across the aortic valve.  ANGIOGRAPHIC DATA:  Ventriculography in the RAO projection reveals inferobasal hypokinesis.  There was an ejection fraction of 46%.  No significant mitral reguritation was noted.  The subclavian and internal mammary appeared to be widely patent on the left.  The left main coronary artery  had mild luminal irregularities.  The proximal left anterior descending artery had about a 50 to 60% eccentric stenosis noted best in the LAO caudal view.  This did not appear to be flow limiting, but did appear to be slightly progressed from the previous study. There was about 50% narrowing of the proximal first diagonal.  There was mild luminal irregularity of the LAD distally.  The circumflex proximal portion was difficult to lay out, but had about 30 to at most 40% narrowing.  The remainder of the circumflex  which consisted of two marginal branches was free of disease.  The right coronary artery was a dominant vessel.  In the stent sites there was 30 to 40% narrowing segmentally throughout the proximal and midvessel at the previous site of stenting.  The vessel was then totally occluded.  Beyond the right ventricular branch.  Following balloon dilatation and stenting, this was reduced to 0%.  The distal vessel was a large caliber vessel providing a posterior descending and posterolateral system all of which were free of critical disease.  There was a fair amount of spasm which opened up nicely with nitroglycerin.  CONCLUSION: 1. Mild reduction in global left ventricular function with an inferobasal wall    motion abnormality in the distribution of the RCA occlusion. 2. Successful percutaneous stenting of a new occlusion of the right coronary    artery beyond the site of previous stenting. 3. Continued patency of the stent sites from the previous percutaneous    intervention. 4. Mild progression of disease in the proximal left anterior descending    artery.  DISPOSITION:  The patient will be treated medically.  He will follow up with Dr. [name] and Dr. Jens Som.  At the present time, we would recommend continued medical therapy.  He must stop smoking, however. DD:  12/23/99 TD:  12/23/99 Job: [id number]

## 2011-03-18 ENCOUNTER — Telehealth: Payer: Self-pay | Admitting: *Deleted

## 2011-03-18 NOTE — Telephone Encounter (Signed)
Received call from wife stating husband is having chest pain.  After further questioning, she states that he is not having any active chest pain now.  Did take Nitroglycerin x 1 yesterday which relieved his pain.  Has had several episodes over the last month.  States he does have SOB, but does have COPD.  Scheduled OV for 12/3 with GD.  Advised to go to ED for any worsening symptoms.  She verbalized understanding.

## 2011-03-19 ENCOUNTER — Encounter: Payer: Self-pay | Admitting: *Deleted

## 2011-03-19 NOTE — Telephone Encounter (Signed)
Called Joseph Forbes asking him to come to the office on 11-30. He stated that he would perfer to come on Monday And see the doctor.

## 2011-03-22 ENCOUNTER — Other Ambulatory Visit: Payer: Self-pay | Admitting: Cardiology

## 2011-03-22 ENCOUNTER — Ambulatory Visit (INDEPENDENT_AMBULATORY_CARE_PROVIDER_SITE_OTHER): Payer: Medicare Other | Admitting: Cardiology

## 2011-03-22 ENCOUNTER — Telehealth: Payer: Self-pay | Admitting: *Deleted

## 2011-03-22 ENCOUNTER — Encounter: Payer: Self-pay | Admitting: *Deleted

## 2011-03-22 ENCOUNTER — Encounter: Payer: Self-pay | Admitting: Cardiology

## 2011-03-22 VITALS — BP 146/76 | HR 65 | Ht 74.0 in | Wt 175.0 lb

## 2011-03-22 DIAGNOSIS — I2510 Atherosclerotic heart disease of native coronary artery without angina pectoris: Secondary | ICD-10-CM

## 2011-03-22 DIAGNOSIS — R072 Precordial pain: Secondary | ICD-10-CM

## 2011-03-22 DIAGNOSIS — E785 Hyperlipidemia, unspecified: Secondary | ICD-10-CM

## 2011-03-22 DIAGNOSIS — F17200 Nicotine dependence, unspecified, uncomplicated: Secondary | ICD-10-CM

## 2011-03-22 MED ORDER — ISOSORBIDE MONONITRATE ER 60 MG PO TB24: 60.0000 mg | ORAL_TABLET | Freq: Every day | ORAL | Status: DC

## 2011-03-22 NOTE — Telephone Encounter (Signed)
No precert required 

## 2011-03-22 NOTE — Assessment & Plan Note (Signed)
Patient reports no claudication and has no ulcers in the lower extremities although he does have diminished distal pulses. Is followed by vascular surgery [place]. He had carotid Dopplers 1 year ago which apparently were stable. We will continue with therapeutic lifestyle changes.

## 2011-03-22 NOTE — Telephone Encounter (Signed)
Dobutamine Echo scheduled for 03-24-2011 @ [facility] Checking percert for [employer]

## 2011-03-22 NOTE — Progress Notes (Signed)
Joseph Bottoms, MD, [facility] ABIM Board Certified in Adult Cardiovascular Medicine,Internal Medicine and Critical Care Medicine    CC: Followup patient with coronary artery disease  HPI: The patient is a 70 year old male with a history of severe peripheral vascular disease, status post bilateral carotid endarterectomies as well as coronary artery disease requiring stent placement in 2000 and 2001. His last catheterization was in 2008. The patient reports several episodes of substernal chest pain since his last visit. As matter fact had a brief episode of left-sided chest pain which was sharp in nature when he was trying to help his wife to the bathroom. This lasted only a few minutes. A month ago however he had more worrisome pain which was a dull ache across his entire chest which occurred while at church. Symptoms lasted approximately 10 minutes and the patient took one sublingual nitroglycerin with relief resolution of his pain. His wife also describes in her mother episode several weeks ago where he had a tightness in the left shoulder radiating down the left arm while sitting at home. However he does not have consistent exertional chest pressure. He does get easily short of breath when walking up a hill but this has been stable and the patient does have significant COPD. He reports no palpitations, presyncope or syncope.     PMH: reviewed and listed in Problem List in Electronic Records (and see below) Past Medical History  Diagnosis Date  . CAD (coronary artery disease)     Normal LV function .Status post stent placement to the right coronary artery in 2000 and 2001Obstructive coronary artery disease in November 2008 by catheterization   . Hypertension   . Hyperlipidemia   . COPD (chronic obstructive pulmonary disease)   . Carotid artery disease status post bottle carotid endarterectomy followed in [facility] by vascular surgery       Allergies/SH/FHX : available in Electronic Records  for review  Medications: Current Outpatient Prescriptions  Medication Sig Dispense Refill  . albuterol-ipratropium (COMBIVENT) 18-103 MCG/ACT inhaler Inhale 2 puffs into the lungs every 6 (six) hours as needed.        Marland Kitchen aspirin 325 MG tablet Take 325 mg by mouth daily.        . famotidine (PEPCID) 20 MG tablet Take 20 mg by mouth daily as needed.        . Fluticasone-Salmeterol (ADVAIR DISKUS) 250-50 MCG/DOSE AEPB Inhale 1 puff into the lungs every 12 (twelve) hours.        . gabapentin (NEURONTIN) 300 MG capsule Take 300 mg by mouth as needed.        . isosorbide mononitrate (IMDUR) 30 MG 24 hr tablet TAKE ONE TABLET BY MOUTH EVERY DAY  30 tablet  1  . lisinopril (PRINIVIL,ZESTRIL) 10 MG tablet Take 10 mg by mouth daily.        . nitroGLYCERIN (NITROSTAT) 0.4 MG SL tablet Place 0.4 mg under the tongue every 5 (five) minutes as needed.        . simvastatin (ZOCOR) 40 MG tablet Take 40 mg by mouth at bedtime.          ROS: No nausea or vomiting. No fever or chills.No melena or hematochezia.No bleeding.No claudication.  Physical Exam: BP 146/76  Pulse 65  Ht 6\' 2"  (1.88 m)  Wt 175 lb (79.379 kg)  BMI 22.47 kg/m2 General: Well-nourished white male in no apparent distress. Neck: Normal carotid upstroke no carotid bruits. Status post bilateral carotid endarterectomy scars which are well-healed. No  thyromegaly nonnodular thyroid. Lungs: Diminished breath sounds bilaterally but without wheezing Cardiac: Regular rate and rhythm with normal S1-S2 no pathological murmurs Vascular: No edema. Posterior tibial pulses 1+ bilateral Skin: Warm and dry Psychiatric: Normal affect  12lead ECG: Normal sinus rhythm otherwise normal tracing Limited bedside ECHO:N/A   Assessment and Plan

## 2011-03-22 NOTE — Assessment & Plan Note (Signed)
The patient was counseled regarding this. Unfortunately he continues to smoke a pack a day

## 2011-03-22 NOTE — Assessment & Plan Note (Signed)
The patient is on statin drug therapy and is followed by his primary care physician.

## 2011-03-22 NOTE — Assessment & Plan Note (Signed)
Patient status post prior coronary intervention approximately 10 years ago. Last catheterization was in 2008 with nonobstructive coronary artery disease. However he has symptoms that are both typical and atypical for angina. We will increase his isosorbide mononitrate to 60 mg by mouth daily. I will also schedule the patient for a dobutamine stress echocardiogram for further risk stratification.

## 2011-03-22 NOTE — Patient Instructions (Addendum)
   Dobutamine Echo If the results of your test are normal or stable, you will receive a letter.  If they are abnormal, the nurse will contact you by phone. Increase Imdur to 60mg  daily Your physician wants you to follow up in: 6 months.  You will receive a reminder letter in the mail one-two months in advance.  If you don't receive a letter, please call our office to schedule the follow up appointment

## 2011-03-24 DIAGNOSIS — R079 Chest pain, unspecified: Secondary | ICD-10-CM

## 2011-07-29 ENCOUNTER — Encounter: Payer: Self-pay | Admitting: Neurosurgery

## 2011-07-30 ENCOUNTER — Encounter: Payer: Self-pay | Admitting: Neurosurgery

## 2011-07-30 ENCOUNTER — Ambulatory Visit (INDEPENDENT_AMBULATORY_CARE_PROVIDER_SITE_OTHER): Payer: Medicare Other | Admitting: Neurosurgery

## 2011-07-30 ENCOUNTER — Ambulatory Visit (INDEPENDENT_AMBULATORY_CARE_PROVIDER_SITE_OTHER): Payer: Medicare Other | Admitting: *Deleted

## 2011-07-30 VITALS — BP 98/56 | HR 76 | Resp 16 | Ht 75.0 in | Wt 177.7 lb

## 2011-07-30 DIAGNOSIS — Z48812 Encounter for surgical aftercare following surgery on the circulatory system: Secondary | ICD-10-CM

## 2011-07-30 DIAGNOSIS — I6529 Occlusion and stenosis of unspecified carotid artery: Secondary | ICD-10-CM | POA: Insufficient documentation

## 2011-07-30 NOTE — Progress Notes (Signed)
[facility] [place] HISTORY AND PHYSICAL   CC: Annual carotid duplex exam Referring Physician: [name]  History of Present Illness: Is a 71 year old patient of Dr. Candie Chroman seen status post bilateral endarterectomies in 2010. He has no signs of amaurosis fugax, CVA, TIA, diplopia, dysphasia, or finding difficulties. He had no new medical problems and has not had any further surgery.  Past Medical History  Diagnosis Date  . CAD (coronary artery disease)     Normal LV function .Status post stent placement to the right coronary artery in 2000 and 2001Obstructive coronary artery disease in November 2008 by catheterization   . Hypertension   . Hyperlipidemia   . COPD (chronic obstructive pulmonary disease)   . Carotid artery disease     ROS: [x]  Positive   [ ]  Denies    General: [ ]  Weight loss, [ ]  Fever, [ ]  chills Neurologic: [ ]  Dizziness, [ ]  Blackouts, [ ]  Seizure [ ]  Stroke, [ ]  "Mini stroke", [ ]  Slurred speech, [ ]  Temporary blindness; [ ]  weakness in arms or legs, [ ]  Hoarseness Cardiac: [ ]  Chest pain/pressure, [ ]  Shortness of breath at rest [ ]  Shortness of breath with exertion, [ ]  Atrial fibrillation or irregular heartbeat Vascular: [ ]  Pain in legs with walking, [ ]  Pain in legs at rest, [ ]  Pain in legs at night,  [ ]  Non-healing ulcer, [ ]  Blood clot in vein/DVT,   Pulmonary: [ ]  Home oxygen, [ ]  Productive cough, [ ]  Coughing up blood, [ ]  Asthma,  [ ]  Wheezing Musculoskeletal:  [ ]  Arthritis, [ ]  Low back pain, [ ]  Joint pain Hematologic: [ ]  Easy Bruising, [ ]  Anemia; [ ]  Hepatitis Gastrointestinal: [ ]  Blood in stool, [ ]  Gastroesophageal Reflux/heartburn, [ ]  Trouble swallowing Urinary: [ ]  chronic Kidney disease, [ ]  on HD - [ ]  MWF or [ ]  TTHS, [ ]  Burning with urination, [ ]  Difficulty urinating Skin: [ ]  Rashes, [ ]  Wounds Psychological: [ ]  Anxiety, [ ]  Depression   Social History History  Substance Use Topics  . 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 Smokeless tobacco: Never Used  . Alcohol Use: No    Family History History reviewed. No pertinent family history.  Allergies  Allergen Reactions  . Other Rash    Some medication OTC he took for headache caused a rash    Current Outpatient Prescriptions  Medication Sig Dispense Refill  . albuterol-ipratropium (COMBIVENT) 18-103 MCG/ACT inhaler Inhale 2 puffs into the lungs every 6 (six) hours as needed.        Marland Kitchen aspirin 325 MG tablet Take 325 mg by mouth daily.        . famotidine (PEPCID) 20 MG tablet Take 20 mg by mouth daily as needed.        . Fluticasone-Salmeterol (ADVAIR DISKUS) 250-50 MCG/DOSE AEPB Inhale 1 puff into the lungs every 12 (twelve) hours.        . gabapentin (NEURONTIN) 300 MG capsule Take 300 mg by mouth as needed.        . isosorbide mononitrate (IMDUR) 60 MG 24 hr tablet Take 1 tablet (60 mg total) by mouth daily.  30 tablet  6  . lisinopril (PRINIVIL,ZESTRIL) 10 MG tablet Take 10 mg by mouth daily.        . nitroGLYCERIN (NITROSTAT) 0.4 MG SL tablet Place 0.4 mg under the tongue every  5 (five) minutes as needed.        . simvastatin (ZOCOR) 40 MG tablet Take 40 mg by mouth at bedtime.          Physical Examination  Filed Vitals:   07/30/11 1456  BP: 98/56  Pulse: 76  Resp: 16    Body mass index is 22.21 kg/(m^2).  General:  WDWN in NAD Gait: Normal HEENT: WNL Eyes: Pupils equal Pulmonary: normal non-labored breathing , without Rales, rhonchi,  wheezing Cardiac: RRR, without  Murmurs, rubs or gallops; Abdomen: soft, NT, no masses Skin: no rashes, ulcers noted  Vascular Exam Pulses: Patient has 2+ radial pulses bilaterally Carotid bruits are absent bilaterally, he has good carotid pulses to auscultation Extremities without ischemic changes, no Gangrene , no cellulitis; no open wounds;  Musculoskeletal: no muscle wasting or atrophy   Neurologic: A&O X 3; Appropriate  Affect ; SENSATION: normal; MOTOR FUNCTION:  moving all extremities equally. Speech is fluent/normal  Non-Invasive Vascular Imaging CAROTID DUPLEX 07/30/2011  Right ICA 20 - 39 % stenosis Left ICA 20 - 39 % stenosis   ASSESSMENT/PLAN: Patient status post bilateral carotid endarterectomies, doing well, signs and symptoms of stroke were discussed he stated understanding. He will followup here in one year with repeat carotid duplex exam and be seen in my clinic his questions were encouraged and answered.  Lauree Chandler  ANP   Clinic MD: Imogene Burn

## 2011-08-10 NOTE — Procedures (Unsigned)
CAROTID DUPLEX EXAM  INDICATION:  Followup bilateral CEA  HISTORY: Diabetes:  No Cardiac:  Yes Hypertension:  No Smoking:  Yes Previous Surgery:  Right CEA 07/05/2008, left CEA 01/08/2002 CV History: Amaurosis Fugax No, Paresthesias No, Hemiparesis No                                      RIGHT             LEFT Brachial systolic pressure:         120               110 Brachial Doppler waveforms:         WNL               WNL Vertebral direction of flow:        Antegrade         Antegrade DUPLEX VELOCITIES (cm/sec) CCA peak systolic                   60                110 ECA peak systolic                   151               119 ICA peak systolic                   96                88 ICA end diastolic                   13                25 PLAQUE MORPHOLOGY:                  Soft              Soft PLAQUE AMOUNT:                      Mild              Mild PLAQUE LOCATION:                    CEA               CEA  IMPRESSION: 1. 1%-39% restenosis of the bilateral carotid endarterectomy. 2. Bilateral vertebral arteries are within normal limits.   ___________________________________________ Quita Skye [name], M.D.  LT/MEDQ  D:  07/30/2011  T:  07/30/2011  Job:  [id number]

## 2012-07-27 ENCOUNTER — Other Ambulatory Visit: Payer: Self-pay | Admitting: *Deleted

## 2012-07-27 DIAGNOSIS — Z48812 Encounter for surgical aftercare following surgery on the circulatory system: Secondary | ICD-10-CM

## 2012-07-31 ENCOUNTER — Other Ambulatory Visit (INDEPENDENT_AMBULATORY_CARE_PROVIDER_SITE_OTHER): Payer: Medicare Other | Admitting: *Deleted

## 2012-07-31 DIAGNOSIS — Z48812 Encounter for surgical aftercare following surgery on the circulatory system: Secondary | ICD-10-CM

## 2012-07-31 DIAGNOSIS — I6529 Occlusion and stenosis of unspecified carotid artery: Secondary | ICD-10-CM

## 2012-08-01 ENCOUNTER — Other Ambulatory Visit: Payer: Self-pay | Admitting: *Deleted

## 2012-08-01 ENCOUNTER — Ambulatory Visit: Payer: Medicare Other | Admitting: Neurosurgery

## 2012-08-01 ENCOUNTER — Other Ambulatory Visit: Payer: Medicare Other

## 2012-08-01 DIAGNOSIS — Z48812 Encounter for surgical aftercare following surgery on the circulatory system: Secondary | ICD-10-CM

## 2012-08-02 ENCOUNTER — Encounter: Payer: Self-pay | Admitting: Vascular Surgery

## 2013-01-02 ENCOUNTER — Ambulatory Visit (INDEPENDENT_AMBULATORY_CARE_PROVIDER_SITE_OTHER): Payer: Medicare Other | Admitting: Cardiology

## 2013-01-02 ENCOUNTER — Encounter: Payer: Self-pay | Admitting: Cardiology

## 2013-01-02 VITALS — BP 112/73 | HR 74 | Ht 75.0 in | Wt 184.4 lb

## 2013-01-02 DIAGNOSIS — I2510 Atherosclerotic heart disease of native coronary artery without angina pectoris: Secondary | ICD-10-CM

## 2013-01-02 DIAGNOSIS — I10 Essential (primary) hypertension: Secondary | ICD-10-CM

## 2013-01-02 DIAGNOSIS — E785 Hyperlipidemia, unspecified: Secondary | ICD-10-CM

## 2013-01-02 MED ORDER — ASPIRIN EC 81 MG PO TBEC: 81.0000 mg | DELAYED_RELEASE_TABLET | Freq: Every day | ORAL | Status: DC

## 2013-01-02 NOTE — Progress Notes (Addendum)
   Clinical Summary Joseph Forbes is a 72 y.o.male  1. CAD: prior stents in  2000 and 2001. Last cath 2008 w/ nonobstructive disease.  - denies any recent chest pain. Stable exercise tolerance, states he can walk 3-4 blocks. No orthopnea, no PND, no LE edema. - compliant w/ meds: ASA 325 mg, simvastatin 40mg   2. Carotid stenosis - prior bilateral carotid endarterectomies, recent US shows good patency. Followed by [facility] - denies any slurred speech, vision changes, focal neuro changes.  3. HL - no recent panel in our system, followed by PCP.   4. Tobacco:0.5 ppd x 50 +. Currently using nicotine patches.  Past Medical History  Diagnosis Date  . CAD (coronary artery disease)     Normal LV function .Status post stent placement to the right coronary artery in 2000 and 2001Obstructive coronary artery disease in November 2008 by catheterization   . Hypertension   . Hyperlipidemia   . COPD (chronic obstructive pulmonary disease)   . Carotid artery disease      Allergies  Allergen Reactions  . Other Rash    Some medication OTC he took for headache caused a rash    Medications reviewed in EPIC  Past Surgical History  Procedure Laterality Date  . Cardiac catheterization  02/2007     Allergies  Allergen Reactions  . Other Rash    Some medication OTC he took for headache caused a rash      No family history on file.   Social History Joseph Forbes reports that he has been smoking Cigarettes.  He has a 55 pack-year smoking history. He has never used smokeless tobacco. Joseph Forbes reports that he does not drink alcohol.   Review of Systems 12 point ROS negative other than reported in HPI  Physical Examination p 74 bp 112/73 Gen: resting comfortably, NAD HEENT: no scleral icterus, pupils equal round and reactive, no palptable cervical adenopathy CV: RRR, no m/r/g, no JVD, no carotid bruits Pulm: mild expiratory wheeze Abd: soft, NT, ND NABS, no  hepatosplenomegaly Ext: warm, no edema.  Skin: warm, no rash Neuro: A&Ox3, no focal deficits    Diagnostic Studies 12/12 DSE: Baseline LVEF 55-60%.  Negative for ischemia.  07/2012 Carotids: patent bilaterally w/ minimal plaque/hyperplasia, vertebals normal bilateral  01/02/13 EKG: SR, normal axis, no ischemic changes  Assessment and Plan  1. CAD: no current symptoms. Continue current medications, except change ASA to 81 mg daily.  2. Carotid stenosis: last US showed no significant stenosis, continue follow up w/ [facility] vascular  3. HL: no recent panel in our system, followed by PCP. Recommend given his history of CV disease consider high dose statin (atorva 80 or crestor 20mg ) based on most recent lipid guideliens.  4. Tobacco: patient on NRT, encouraged to continue progress. Will follow up next visit.   F/u 6 months   Antoine Poche, M.D., F.A.C.C.

## 2013-01-02 NOTE — Patient Instructions (Addendum)
Your physician recommends that you schedule a follow-up appointment in: 6 months with Dr. Wyline Mood. You should receive a letter in the mail in 4 months. If you do not receive this letter by January 2015 call our office to schedule this appointment.  Your physician has recommended you make the following change in your medication:  Decrease Aspirin to 81 MG once daily.   Continue all other medications the same.

## 2013-01-05 ENCOUNTER — Encounter: Payer: Self-pay | Admitting: Cardiology

## 2013-03-29 ENCOUNTER — Other Ambulatory Visit: Payer: Self-pay | Admitting: Urology

## 2013-04-18 ENCOUNTER — Encounter (HOSPITAL_BASED_OUTPATIENT_CLINIC_OR_DEPARTMENT_OTHER): Payer: Self-pay | Admitting: *Deleted

## 2013-04-18 NOTE — Progress Notes (Signed)
To [facility] at 0800  Hg on arrival-Ekg in epic-last office visit in chart-reviewed with Dr Rica Mast.Instructed Npo after Mn-refrain from smoking also.Will take pepcid and use his inhaler that am.

## 2013-04-24 ENCOUNTER — Encounter (HOSPITAL_BASED_OUTPATIENT_CLINIC_OR_DEPARTMENT_OTHER): Payer: Self-pay | Admitting: *Deleted

## 2013-04-24 ENCOUNTER — Encounter (HOSPITAL_BASED_OUTPATIENT_CLINIC_OR_DEPARTMENT_OTHER): Payer: Medicare Other | Admitting: Anesthesiology

## 2013-04-24 ENCOUNTER — Ambulatory Visit (HOSPITAL_BASED_OUTPATIENT_CLINIC_OR_DEPARTMENT_OTHER)
Admission: RE | Admit: 2013-04-24 | Discharge: 2013-04-24 | Disposition: A | Discharge status: Home / Self Care | Payer: Medicare Other | Source: Ambulatory Visit | Attending: Urology | Admitting: Urology

## 2013-04-24 ENCOUNTER — Ambulatory Visit (HOSPITAL_BASED_OUTPATIENT_CLINIC_OR_DEPARTMENT_OTHER): Payer: Medicare Other | Admitting: Anesthesiology

## 2013-04-24 ENCOUNTER — Encounter (HOSPITAL_BASED_OUTPATIENT_CLINIC_OR_DEPARTMENT_OTHER)
Admission: RE | Disposition: A | Discharge status: Home / Self Care | Payer: Self-pay | Source: Ambulatory Visit | Attending: Urology

## 2013-04-24 DIAGNOSIS — I2510 Atherosclerotic heart disease of native coronary artery without angina pectoris: Secondary | ICD-10-CM | POA: Insufficient documentation

## 2013-04-24 DIAGNOSIS — J449 Chronic obstructive pulmonary disease, unspecified: Secondary | ICD-10-CM | POA: Insufficient documentation

## 2013-04-24 DIAGNOSIS — J4489 Other specified chronic obstructive pulmonary disease: Secondary | ICD-10-CM | POA: Insufficient documentation

## 2013-04-24 DIAGNOSIS — N433 Hydrocele, unspecified: Secondary | ICD-10-CM | POA: Insufficient documentation

## 2013-04-24 DIAGNOSIS — I739 Peripheral vascular disease, unspecified: Secondary | ICD-10-CM | POA: Insufficient documentation

## 2013-04-24 DIAGNOSIS — I10 Essential (primary) hypertension: Secondary | ICD-10-CM | POA: Insufficient documentation

## 2013-04-24 DIAGNOSIS — E785 Hyperlipidemia, unspecified: Secondary | ICD-10-CM | POA: Insufficient documentation

## 2013-04-24 HISTORY — PX: HYDROCELE EXCISION: SHX482

## 2013-04-24 SURGERY — HYDROCELECTOMY
Anesthesia: General | Site: Scrotum | Laterality: Right

## 2013-04-24 MED ORDER — CEFAZOLIN SODIUM 1 G IJ SOLR
INTRAMUSCULAR | Status: AC
  Filled 2013-04-24: qty 10

## 2013-04-24 MED ORDER — LACTATED RINGERS IV SOLN
INTRAVENOUS | Status: DC
  Filled 2013-04-24: qty 1000

## 2013-04-24 MED ORDER — PROPOFOL 10 MG/ML IV BOLUS
INTRAVENOUS | Status: DC | PRN
  Administered 2013-04-24: 150 mg via INTRAVENOUS

## 2013-04-24 MED ORDER — MIDAZOLAM HCL 2 MG/2ML IJ SOLN
INTRAMUSCULAR | Status: AC
  Filled 2013-04-24: qty 2

## 2013-04-24 MED ORDER — HYDROCODONE-ACETAMINOPHEN 5-325 MG PO TABS: 1.0000 | ORAL_TABLET | Freq: Four times a day (QID) | ORAL | Status: DC | PRN

## 2013-04-24 MED ORDER — HYDROCODONE-ACETAMINOPHEN 5-325 MG PO TABS
ORAL_TABLET | ORAL | Status: AC
Start: 2013-04-24 — End: 2013-04-24
  Filled 2013-04-24: qty 1

## 2013-04-24 MED ORDER — FENTANYL CITRATE 0.05 MG/ML IJ SOLN
INTRAMUSCULAR | Status: AC
  Filled 2013-04-24: qty 2

## 2013-04-24 MED ORDER — DEXAMETHASONE SODIUM PHOSPHATE 4 MG/ML IJ SOLN
INTRAMUSCULAR | Status: DC | PRN
  Administered 2013-04-24: 4 mg via INTRAVENOUS

## 2013-04-24 MED ORDER — FENTANYL CITRATE 0.05 MG/ML IJ SOLN
25.0000 ug | INTRAMUSCULAR | Status: DC | PRN
  Administered 2013-04-24 (×2): 25 ug via INTRAVENOUS
  Filled 2013-04-24: qty 1

## 2013-04-24 MED ORDER — BUPIVACAINE HCL (PF) 0.25 % IJ SOLN
INTRAMUSCULAR | Status: DC | PRN
  Administered 2013-04-24: 10 mL

## 2013-04-24 MED ORDER — KETOROLAC TROMETHAMINE 30 MG/ML IJ SOLN
INTRAMUSCULAR | Status: DC | PRN
  Administered 2013-04-24: 30 mg via INTRAVENOUS

## 2013-04-24 MED ORDER — CEFAZOLIN SODIUM-DEXTROSE 2-3 GM-% IV SOLR
INTRAVENOUS | Status: DC | PRN
  Administered 2013-04-24: 2 g via INTRAVENOUS

## 2013-04-24 MED ORDER — FENTANYL CITRATE 0.05 MG/ML IJ SOLN
INTRAMUSCULAR | Status: AC
  Filled 2013-04-24: qty 4

## 2013-04-24 MED ORDER — COLLODION LIQD
Status: DC | PRN
  Administered 2013-04-24: 1 via TOPICAL

## 2013-04-24 MED ORDER — HYDROCODONE-ACETAMINOPHEN 5-325 MG PO TABS
1.0000 | ORAL_TABLET | Freq: Four times a day (QID) | ORAL | Status: DC | PRN
  Filled 2013-04-24: qty 1

## 2013-04-24 MED ORDER — CEFAZOLIN SODIUM 1-5 GM-% IV SOLN
1.0000 g | INTRAVENOUS | Status: DC
  Filled 2013-04-24: qty 50

## 2013-04-24 MED ORDER — LIDOCAINE HCL (CARDIAC) 20 MG/ML IV SOLN
INTRAVENOUS | Status: DC | PRN
  Administered 2013-04-24: 40 mg via INTRAVENOUS

## 2013-04-24 MED ORDER — ONDANSETRON HCL 4 MG/2ML IJ SOLN
INTRAMUSCULAR | Status: DC | PRN
  Administered 2013-04-24: 4 mg via INTRAVENOUS

## 2013-04-24 MED ORDER — LACTATED RINGERS IV SOLN
INTRAVENOUS | Status: DC | PRN
  Administered 2013-04-24 (×2): via INTRAVENOUS

## 2013-04-24 MED ORDER — FENTANYL CITRATE 0.05 MG/ML IJ SOLN
INTRAMUSCULAR | Status: DC | PRN
  Administered 2013-04-24 (×7): 12.5 ug via INTRAVENOUS
  Administered 2013-04-24: 25 ug via INTRAVENOUS
  Administered 2013-04-24: 12.5 ug via INTRAVENOUS
  Administered 2013-04-24: 25 ug via INTRAVENOUS
  Administered 2013-04-24 (×4): 12.5 ug via INTRAVENOUS

## 2013-04-24 MED ORDER — STERILE WATER FOR IRRIGATION IR SOLN
Status: DC | PRN
  Administered 2013-04-24: 500 mL

## 2013-04-24 SURGICAL SUPPLY — 44 items
ADH SKN CLS APL DERMABOND .7 (GAUZE/BANDAGES/DRESSINGS) ×1
APL SKNCLS STERI-STRIP NONHPOA (GAUZE/BANDAGES/DRESSINGS)
APPLICATOR COTTON TIP 6IN STRL (MISCELLANEOUS) ×3 IMPLANT
BENZOIN TINCTURE PRP APPL 2/3 (GAUZE/BANDAGES/DRESSINGS) IMPLANT
BLADE SURG 15 STRL LF DISP TIS (BLADE) ×1 IMPLANT
BLADE SURG 15 STRL SS (BLADE) ×3
BLADE SURG ROTATE 9660 (MISCELLANEOUS) ×3 IMPLANT
BNDG GAUZE ELAST 4 BULKY (GAUZE/BANDAGES/DRESSINGS) ×3 IMPLANT
CANISTER SUCTION 1200CC (MISCELLANEOUS) IMPLANT
CANISTER SUCTION 2500CC (MISCELLANEOUS) ×2 IMPLANT
CLOSURE WOUND 1/2 X4 (GAUZE/BANDAGES/DRESSINGS)
CLOTH BEACON ORANGE TIMEOUT ST (SAFETY) ×3 IMPLANT
COVER MAYO STAND STRL (DRAPES) ×3 IMPLANT
COVER TABLE BACK 60X90 (DRAPES) ×3 IMPLANT
DERMABOND ADVANCED (GAUZE/BANDAGES/DRESSINGS) ×2
DERMABOND ADVANCED .7 DNX12 (GAUZE/BANDAGES/DRESSINGS) IMPLANT
DRAIN PENROSE 18X1/4 LTX STRL (WOUND CARE) ×2 IMPLANT
DRAPE PED LAPAROTOMY (DRAPES) ×3 IMPLANT
ELECT NEEDLE TIP 2.8 STRL (NEEDLE) ×3 IMPLANT
ELECT REM PT RETURN 9FT ADLT (ELECTROSURGICAL) ×3
ELECTRODE REM PT RTRN 9FT ADLT (ELECTROSURGICAL) ×1 IMPLANT
GLOVE BIO SURGEON STRL SZ7 (GLOVE) ×6 IMPLANT
GLOVE INDICATOR 7.5 STRL GRN (GLOVE) ×2 IMPLANT
GOWN W/2 COTTON TOWELS 2 STD (GOWNS) ×1 IMPLANT
MARKER SKIN DUAL TIP RULER LAB (MISCELLANEOUS) ×3 IMPLANT
NDL HYPO 25X1 1.5 SAFETY (NEEDLE) IMPLANT
NEEDLE HYPO 25X1 1.5 SAFETY (NEEDLE) IMPLANT
NS IRRIG 500ML POUR BTL (IV SOLUTION) ×5 IMPLANT
PACK BASIN DAY SURGERY FS (CUSTOM PROCEDURE TRAY) ×3 IMPLANT
PAD PREP 24X48 CUFFED NSTRL (MISCELLANEOUS) ×3 IMPLANT
PENCIL BUTTON HOLSTER BLD 10FT (ELECTRODE) ×3 IMPLANT
STRIP CLOSURE SKIN 1/2X4 (GAUZE/BANDAGES/DRESSINGS) IMPLANT
SUPPORT SCROTAL LG STRP (MISCELLANEOUS) ×2 IMPLANT
SUPPORTER ATHLETIC LG (MISCELLANEOUS) ×1
SUT CHROMIC 3 0 PS 2 (SUTURE) ×3 IMPLANT
SUT CHROMIC 3 0 SH 27 (SUTURE) ×6 IMPLANT
SUT MON AB 4-0 PC3 18 (SUTURE) IMPLANT
SUT SILK 2 0 SH (SUTURE) IMPLANT
SUT VIC AB 3-0 SH 27 (SUTURE)
SUT VIC AB 3-0 SH 27X BRD (SUTURE) IMPLANT
SUT VICRYL 0 TIES 12 18 (SUTURE) IMPLANT
SYR CONTROL 10ML LL (SYRINGE) ×2 IMPLANT
WATER STERILE IRR 500ML POUR (IV SOLUTION) ×3 IMPLANT
YANKAUER SUCT BULB TIP NO VENT (SUCTIONS) ×3 IMPLANT

## 2013-04-24 NOTE — Discharge Instructions (Addendum)
°  Post Anesthesia Home Care Instructions ° °Activity: °Get plenty of rest for the remainder of the day. A responsible adult should stay with you for 24 hours following the procedure.  °For the next 24 hours, DO NOT: °-Drive a car °-Operate machinery °-Drink alcoholic beverages °-Take any medication unless instructed by your physician °-Make any legal decisions or sign important papers. ° °Meals: °Start with liquid foods such as gelatin or soup. Progress to regular foods as tolerated. Avoid greasy, spicy, heavy foods. If nausea and/or vomiting occur, drink only clear liquids until the nausea and/or vomiting subsides. Call your physician if vomiting continues. ° °Special Instructions/Symptoms: °Your throat may feel dry or sore from the anesthesia or the breathing tube placed in your throat during surgery. If this causes discomfort, gargle with warm salt water. The discomfort should disappear within 24 hours. ° ° ° HOME CARE INSTRUCTIONS FOR SCROTAL PROCEDURES ° °Wound Care & Hygiene: °You may apply an ice bag to the scrotum for the first 24 hours.  This may help decrease swelling and soreness.  You may have a dressing held in place by an athletic supporter.  You may remove the dressing in 24 hours and shower in 48 hours.  Continue to use the athletic supporter or tight briefs for at least a week. °Activity: °Rest today - not necessarily flat bed rest.  Just take it easy.  You should not do strenuous activities until your follow-up visit with your doctor.  You may resume light activity in 48 hours. ° °Return to Work: ° °Your doctor will advise you of this depending on the type of work you do ° °Diet: °Drink liquids or eat a light diet this evening.  You may resume a regular diet tomorrow. ° °General Expectations: °You may have a small amount of bleeding.  The scrotum may be swollen or bruised for about a week. ° °Call your Doctor if these occur: ° -persistent or heavy bleeding ° -temperature of 101 degrees or  more ° -severe pain, not relieved by your pain medication ° °Return to Doctor's Office:  °Call to set up and appointment. ° °Patient Signature:  __________________________________________________ ° °Nurse's Signature:  __________________________________________________ ° °

## 2013-04-24 NOTE — Transfer of Care (Signed)
Immediate Anesthesia Transfer of Care Note  Patient: Joseph Forbes  Procedure(s) Performed: Procedure(s) (LRB): HYDROCELECTOMY ADULT (Right)  Patient Location: PACU  Anesthesia Type: General  Level of Consciousness: awake, sedated, patient cooperative and responds to stimulation  Airway & Oxygen Therapy: Patient Spontanous Breathing and Patient connected to face mask oxygen  Post-op Assessment: Report given to PACU RN, Post -op Vital signs reviewed and stable and Patient moving all extremities  Post vital signs: Reviewed and stable  Complications: No apparent anesthesia complications

## 2013-04-24 NOTE — Anesthesia Postprocedure Evaluation (Signed)
  Anesthesia Post-op Note  Patient: Joseph Forbes  Procedure(s) Performed: Procedure(s) (LRB): HYDROCELECTOMY ADULT (Right)  Patient Location: PACU  Anesthesia Type: General  Level of Consciousness: awake and alert   Airway and Oxygen Therapy: Patient Spontanous Breathing  Post-op Pain: mild  Post-op Assessment: Post-op Vital signs reviewed, Patient's Cardiovascular Status Stable, Respiratory Function Stable, Patent Airway and No signs of Nausea or vomiting  Last Vitals:  Filed Vitals:   04/24/13 1245  BP: 163/75  Pulse: 74  Temp: 36 C  Resp: 16    Post-op Vital Signs: stable   Complications: No apparent anesthesia complications

## 2013-04-24 NOTE — Op Note (Signed)
Joseph Forbes is a 73 y.o.   04/24/2013  General  Preop diagnosis: Right hydrocele of the cord  Postop diagnosis: Same  Procedure done: Right hydrocelectomy  Surgeon: Charlene Brooke. Nicodemus Denk  Anesthesia: General  Indication: Patient is a 73 years old male who had the inguinal excision of a lipoma of the right cord and aspiration of right hydrocele in [place] on 01/05/2013. Patient reports that 2 weeks later the swelling had recurred. He has been having increasing scrotal discomfort. Scrotal ultrasound revealed a hydrocele of the cord on the right side and bilateral small hydroceles.Marland Kitchen He is scheduled for excision of the right hydrocele of the cord  Procedure: Patient was identified by his wrist band and proper timeout was taken.  Under general anesthesia patient was prepped and draped and placed in the supine position. The scrotal skin was infiltrated with 0.25% Marcaine. A longitudinal incision was then made on the right scrotum. The incision was carried down to the tunica vaginalis which was then incised. Hemostasis was secured with electrocautery. A small amount of fluid was drained out of the hydrocele sac. There is a 6 cm x 5 cm hydrocele of the right cord. The hydrocele was completely dissected from the rest of the cord and the testicle. It was excised in toto. Hemostasis was secured with electrocautery. The edges of the excision of the hydrocele were then approximated with #3-0 chromic. The wound was irrigated with normal saline. There was no evidence of bleeding at the end of the procedure. A small Penrose drain was placed in the scrotum and brought out through a separate stab wound. The scrotum was then closed in 2 layers with #3-0 chromic.  EBL: Minimal  Needles, sponges count: Correct.  The patient tolerated the procedure well and left the OR in satisfactory condition to postanesthesia care unit.  CC: Dr. Matthias Hughs

## 2013-04-24 NOTE — Anesthesia Procedure Notes (Signed)
Procedure Name: LMA Insertion Date/Time: 04/24/2013 9:18 AM Performed by: Justice Rocher Pre-anesthesia Checklist: Patient identified, Emergency Drugs available, Suction available and Patient being monitored Patient Re-evaluated:Patient Re-evaluated prior to inductionOxygen Delivery Method: Circle System Utilized Preoxygenation: Pre-oxygenation with 100% oxygen Intubation Type: IV induction Ventilation: Mask ventilation without difficulty LMA: LMA inserted LMA Size: 4.0 Number of attempts: 1 Airway Equipment and Method: bite block Placement Confirmation: positive ETCO2 Tube secured with: Tape Dental Injury: Teeth and Oropharynx as per pre-operative assessment

## 2013-04-24 NOTE — H&P (Signed)
History of Present Illness Joseph Forbes is referred by Dr Matthias Hughs. He had inguinal excision of lipoma of the right cord and aspiration of right hydrocele in [facility] Forbes on 9/19. The patient reports that 2 weeks later the swelling had recurred. He has been having increasing scrotal discomfort. He has nocturia x 2 and does not have any other voiding symptoms. PSA was 0.6 in January 2014.   Past Medical History Problems  1. History of cardiac disorder (V12.50) 2. History of heartburn (V12.79) 3. History of hyperlipidemia (V12.29)  Surgical History Problems  1. History of Carotid Artery Exploration 2. History of Cath Stent Placement  Current Meds 1. Aspirin 325 MG Oral Tablet;  Therapy: (Recorded:09Dec2014) to Recorded 2. Famotidine 20 MG Oral Tablet;  Therapy: (Recorded:09Dec2014) to Recorded 3. Simvastatin 40 MG Oral Tablet;  Therapy: (Recorded:09Dec2014) to Recorded  Allergies Medication  1. No Known Drug Allergies  Family History Problems  1. 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  Genitourinary: nocturia and scrotal swelling.    Vitals Vital Signs [Data Includes: Last 1 Day]  Recorded: [id number] 01:45PM  Height: 6 ft 3 in Weight: 184 lb  BMI Calculated: 23 BSA Calculated: 2.12 Blood Pressure: 158 / 80 Temperature: 98.3 F Heart Rate: 76 Respiration: 18  Physical Exam Constitutional: Well nourished and well developed . No acute distress.  ENT:. The ears and nose are  normal in appearance.  Neck: The appearance of the neck is normal and no neck mass is present.  Pulmonary: No respiratory distress and normal respiratory rhythm and effort.  Cardiovascular: Heart rate and rhythm are normal . No peripheral edema.  Abdomen: The abdomen is soft and nontender. No masses are palpated. No CVA tenderness. No hernias are palpable. No hepatosplenomegaly noted.  Rectal: Rectal exam demonstrates normal sphincter tone, no tenderness and no masses. Estimated prostate size is 3+. The prostate has no nodularity and is not tender. The left seminal vesicle is nonpalpable. The right seminal vesicle is nonpalpable. The perineum is normal on inspection.  Genitourinary: Examination of the penis demonstrates no discharge, no masses, no lesions and a normal meatus. The scrotum is without lesions. Examination of the right scrotum demonstrates a hydrocele. The hydrocele is above the testis. The right epididymis is palpably normal and non-tender. The left epididymis is palpably normal and non-tender. The right testis is non-tender and without masses. The left testis is non-tender and without masses.  Lymphatics: The femoral and inguinal nodes are not enlarged or tender.  Skin: Normal skin turgor, no visible rash and no visible skin lesions.  Neuro/Psych:. Mood and affect are appropriate.    Results/Data Urine [Data Includes: Last 1 Day]   [id number]  COLOR YELLOW   APPEARANCE CLEAR   SPECIFIC GRAVITY 1.020   pH 6.0   GLUCOSE NEG mg/dL  BILIRUBIN NEG   KETONE NEG mg/dL  BLOOD TRACE   PROTEIN NEG mg/dL  UROBILINOGEN 0.2 mg/dL  NITRITE NEG   LEUKOCYTE ESTERASE NEG   SQUAMOUS EPITHELIAL/HPF RARE   WBC 0-2 WBC/hpf  RBC 0-2 RBC/hpf  BACTERIA NONE SEEN  CRYSTALS NONE SEEN   CASTS NONE SEEN    SCROTAL ULTRASOUND  INDICATION: Swelling right scrotum   The right testis measures 4.6 x 1.9 x 3.2 cm. A hypoechoic lesion is noted above the right testis and measures 4.4 x 5.4 x 4.3 cm.    The left testis measures 4.5 x 2.1 x 2.5 cm.   There are small bilateral hydroceles. There is good flow to both testis.  IMPRESSION: Right hydrocele of the cord. Small bilateral hydroceles.   Assessment Assessed  1. 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 UA With REFLEX; Status:Resulted - Requires Verification;   Done: [id number] 01:00PM Hydrocele, right  2. SCROTAL U/S; Status:Resulted - Requires Verification;   Done: [id number] 12:00AM  Right hydrocelectomy. The procedure, risks, benefits were explained to the patient and his son. The risks include but are not limited to hemorrhage, infection, hematoma, recurrence. They understand and are agreeable.

## 2013-04-24 NOTE — Anesthesia Preprocedure Evaluation (Addendum)
Anesthesia Evaluation  Patient identified by MRN, date of birth, ID band Patient awake    Reviewed: Allergy & Precautions, H&P , NPO status , Patient's Chart, lab work & pertinent test results  Airway Mallampati: II TM Distance: >3 FB Neck ROM: Full    Dental no notable dental hx.    Pulmonary COPD COPD inhaler, Current Smoker,  breath sounds clear to auscultation  + wheezing      Cardiovascular Exercise Tolerance: Good hypertension, + CAD and + Peripheral Vascular Disease Rhythm:Regular Rate:Normal  ECG normal and Stress ECHO OK in 2012   Neuro/Psych PSYCHIATRIC DISORDERS negative neurological ROS     GI/Hepatic negative GI ROS, Neg liver ROS,   Endo/Other  negative endocrine ROS  Renal/GU negative Renal ROS  negative genitourinary   Musculoskeletal negative musculoskeletal ROS (+)   Abdominal   Peds negative pediatric ROS (+)  Hematology negative hematology ROS (+)   Anesthesia Other Findings   Reproductive/Obstetrics negative OB ROS                         Anesthesia Physical Anesthesia Plan  ASA: III  Anesthesia Plan: General   Post-op Pain Management:    Induction: Intravenous  Airway Management Planned: LMA  Additional Equipment:   Intra-op Plan:   Post-operative Plan: Extubation in OR  Informed Consent: I have reviewed the patients History and Physical, chart, labs and discussed the procedure including the risks, benefits and alternatives for the proposed anesthesia with the patient or authorized representative who has indicated his/her understanding and acceptance.   Dental advisory given  Plan Discussed with: CRNA  Anesthesia Plan Comments: (He states he feels in his usual health. Albuterol inhaler will be given preoperatively.)       Anesthesia Quick Evaluation

## 2013-04-25 ENCOUNTER — Encounter (HOSPITAL_BASED_OUTPATIENT_CLINIC_OR_DEPARTMENT_OTHER): Payer: Self-pay | Admitting: Urology

## 2013-04-26 LAB — POCT HEMOGLOBIN-HEMACUE: Hemoglobin: 16.7 g/dL (ref 13.0–17.0)

## 2013-07-08 NOTE — Progress Notes (Signed)
Clinical Summary Mr. Edmondson is a 73 y.o.male seen today for follow up of the following medical problems.  1. CAD: prior stents in 2000 and 2001. Last cath 2008 w/ nonobstructive disease. Normal LVEF 65% by echo 2012. - denies any recent chest pain. Stable exercise tolerance, states he can walk 3-4 blocks. No orthopnea, no PND, no LE edema. No change since last visit - compliant w/ meds  2. Carotid stenosis  - prior bilateral carotid endarterectomies, recent US shows good patency. Followed by [facility]  - denies any slurred speech, vision changes, focal neuro changes.   3. HL  - no recent panel in our system, followed by PCP.  - physical scheduled for Dr Scotty Court next month with labs  4. Tobacco:0.5 ppd x 50 +. Currently using nicotine patches. - nicotine patches gave rash. E-cigs give heartburn.   Past Medical History  Diagnosis Date  . CAD (coronary artery disease)     Normal LV function .Status post stent placement to the right coronary artery in 2000 and 2001Obstructive coronary artery disease in November 2008 by catheterization   . Hyperlipidemia   . COPD (chronic obstructive pulmonary disease)   . Carotid artery disease   . Hypertension     pt denies, no meds     Allergies  Allergen Reactions  . Other Rash    Some medication OTC he took for headache caused a rash     Current Outpatient Prescriptions  Medication Sig Dispense Refill  . albuterol-ipratropium (COMBIVENT) 18-103 MCG/ACT inhaler Inhale 2 puffs into the lungs every 6 (six) hours as needed.        Marland Kitchen aspirin EC 81 MG tablet Take 1 tablet (81 mg total) by mouth daily.  30 tablet  3  . famotidine (PEPCID) 20 MG tablet Take 20 mg by mouth daily.       Marland Kitchen HYDROcodone-acetaminophen (NORCO) 5-325 MG per tablet Take 1 tablet by mouth every 6 (six) hours as needed for moderate pain.  30 tablet  0  . nitroGLYCERIN (NITROSTAT) 0.4 MG SL tablet Place 0.4 mg under the tongue every 5 (five) minutes as needed.         . simvastatin (ZOCOR) 40 MG tablet Take 40 mg by mouth at bedtime.         No current facility-administered medications for this visit.     Past Surgical History  Procedure Laterality Date  . Cardiac catheterization  02/2007  . Hydrocele excision Right 04/24/2013    Procedure: HYDROCELECTOMY ADULT;  Surgeon: Hanley Ben, MD;  Location: [facility];  Service: Urology;  Laterality: Right;     Allergies  Allergen Reactions  . Other Rash    Some medication OTC he took for headache caused a rash      No family history on file.   Social History Mr. Hinojosa reports that he has been smoking Cigarettes.  He has a 55 pack-year smoking history. He has never used smokeless tobacco. Mr. Basey reports that he does not drink alcohol.   Review of Systems CONSTITUTIONAL: No weight loss, fever, chills, weakness or fatigue.  HEENT: Eyes: No visual loss, blurred vision, double vision or yellow sclerae.No hearing loss, sneezing, congestion, runny nose or sore throat.  SKIN: No rash or itching.  CARDIOVASCULAR: per HPI RESPIRATORY: No shortness of breath, cough or sputum.  GASTROINTESTINAL: No anorexia, nausea, vomiting or diarrhea. No abdominal pain or blood.  GENITOURINARY: No burning on urination, no polyuria NEUROLOGICAL: No headache, dizziness,  syncope, paralysis, ataxia, numbness or tingling in the extremities. No change in bowel or bladder control.  MUSCULOSKELETAL: No muscle, back pain, joint pain or stiffness.  LYMPHATICS: No enlarged nodes. No history of splenectomy.  PSYCHIATRIC: No history of depression or anxiety.  ENDOCRINOLOGIC: No reports of sweating, cold or heat intolerance. No polyuria or polydipsia.  Marland Kitchen   Physical Examination p 72 bp 142/73 Wt 186 lbs BMI 23 Gen: resting comfortably, no acute distress HEENT: no scleral icterus, pupils equal round and reactive, no palptable cervical adenopathy,  CV: RRR, no m/r/g, no JVD, no carotid bruits Resp:  Clear to auscultation bilaterally GI: abdomen is soft, non-tender, non-distended, normal bowel sounds, no hepatosplenomegaly MSK: extremities are warm, no edema.  Skin: warm, no rash Neuro:  no focal deficits Psych: appropriate affect   Diagnostic Studies 12/12 DSE: Baseline LVEF 55-60%. Negative for ischemia.   07/2012 Carotids: patent bilaterally w/ minimal plaque/hyperplasia, vertebals normal bilateral   01/02/13 EKG: SR, normal axis, no ischemic changes     Assessment and Plan   1. CAD: no current symptoms. Continue current medications.  2. Carotid stenosis: last US showed no significant stenosis, continue follow up w/ [facility]   3. HL: no recent panel in our system, followed by PCP. Recommend given his history of CV disease consider high dose statin (atorva 80 or crestor 20mg ) based on most recent lipid guidelines - reports labs coming up next month with PCP, will follow up lipid panel  4. Tobacco - counseled on cessation and health benefits. Has had some side effects on NRT as described above, he is not interested in other therapies at this time.          Arnoldo Lenis, M.D., F.A.C.C.

## 2013-07-09 ENCOUNTER — Ambulatory Visit (INDEPENDENT_AMBULATORY_CARE_PROVIDER_SITE_OTHER): Payer: Medicare Other | Admitting: Cardiology

## 2013-07-09 ENCOUNTER — Encounter: Payer: Self-pay | Admitting: Cardiology

## 2013-07-09 VITALS — BP 142/73 | HR 72 | Ht 75.0 in | Wt 186.0 lb

## 2013-07-09 DIAGNOSIS — I2510 Atherosclerotic heart disease of native coronary artery without angina pectoris: Secondary | ICD-10-CM

## 2013-07-09 DIAGNOSIS — I6529 Occlusion and stenosis of unspecified carotid artery: Secondary | ICD-10-CM

## 2013-07-09 DIAGNOSIS — E785 Hyperlipidemia, unspecified: Secondary | ICD-10-CM

## 2013-07-09 NOTE — Patient Instructions (Signed)
Your physician recommends that you schedule a follow-up appointment in: 1 year with Dr. Branch. You should receive a letter in the mail in 10 months. If you do not receive this letter by January 2016 call our office to schedule this appointment.   Your physician recommends that you continue on your current medications as directed. Please refer to the Current Medication list given to you today.   

## 2013-07-30 ENCOUNTER — Other Ambulatory Visit: Payer: Self-pay | Admitting: Vascular Surgery

## 2013-07-30 DIAGNOSIS — I6529 Occlusion and stenosis of unspecified carotid artery: Secondary | ICD-10-CM

## 2013-07-30 DIAGNOSIS — Z48812 Encounter for surgical aftercare following surgery on the circulatory system: Secondary | ICD-10-CM

## 2013-07-31 ENCOUNTER — Other Ambulatory Visit (HOSPITAL_COMMUNITY): Payer: Medicare Other

## 2013-07-31 ENCOUNTER — Ambulatory Visit: Payer: Medicare Other | Admitting: Vascular Surgery

## 2013-08-14 ENCOUNTER — Ambulatory Visit: Payer: Medicare Other | Admitting: Vascular Surgery

## 2013-08-14 ENCOUNTER — Other Ambulatory Visit (HOSPITAL_COMMUNITY): Payer: Medicare Other

## 2013-08-15 ENCOUNTER — Encounter: Payer: Self-pay | Admitting: Family

## 2013-08-16 ENCOUNTER — Ambulatory Visit (INDEPENDENT_AMBULATORY_CARE_PROVIDER_SITE_OTHER): Payer: Medicare Other | Admitting: Family

## 2013-08-16 ENCOUNTER — Encounter: Payer: Self-pay | Admitting: Family

## 2013-08-16 ENCOUNTER — Ambulatory Visit (HOSPITAL_COMMUNITY)
Admission: RE | Admit: 2013-08-16 | Discharge: 2013-08-16 | Disposition: A | Discharge status: Home / Self Care | Payer: Medicare Other | Source: Ambulatory Visit | Attending: Family | Admitting: Family

## 2013-08-16 VITALS — BP 129/69 | HR 72 | Resp 16 | Ht 75.0 in | Wt 181.0 lb

## 2013-08-16 DIAGNOSIS — I6529 Occlusion and stenosis of unspecified carotid artery: Secondary | ICD-10-CM

## 2013-08-16 DIAGNOSIS — Z48812 Encounter for surgical aftercare following surgery on the circulatory system: Secondary | ICD-10-CM

## 2013-08-16 NOTE — Patient Instructions (Signed)
Stroke Prevention Some medical conditions and behaviors are associated with an increased chance of having a stroke. You may prevent a stroke by making healthy choices and managing medical conditions. HOW CAN I REDUCE MY RISK OF HAVING A STROKE?   Stay physically active. Get at least 30 minutes of activity on most or all days.  Do not smoke. It may also be helpful to avoid exposure to secondhand smoke.  Limit alcohol use. Moderate alcohol use is considered to be:  No more than 2 drinks per day for men.  No more than 1 drink per day for nonpregnant women.  Eat healthy foods. This involves  Eating 5 or more servings of fruits and vegetables a day.  Following a diet that addresses high blood pressure (hypertension), high cholesterol, diabetes, or obesity.  Manage your cholesterol levels.  A diet low in saturated fat, trans fat, and cholesterol and high in fiber may control cholesterol levels.  Take any prescribed medicines to control cholesterol as directed by your health care provider.  Manage your diabetes.  A controlled-carbohydrate, controlled-sugar diet is recommended to manage diabetes.  Take any prescribed medicines to control diabetes as directed by your health care provider.  Control your hypertension.  A low-salt (sodium), low-saturated fat, low-trans fat, and low-cholesterol diet is recommended to manage hypertension.  Take any prescribed medicines to control hypertension as directed by your health care provider.  Maintain a healthy weight.  A reduced-calorie, low-sodium, low-saturated fat, low-trans fat, low-cholesterol diet is recommended to manage weight.  Stop drug abuse.  Avoid taking birth control pills.  Talk to your health care provider about the risks of taking birth control pills if you are over 35 years old, smoke, get migraines, or have ever had a blood clot.  Get evaluated for sleep disorders (sleep apnea).  Talk to your health care provider about  getting a sleep evaluation if you snore a lot or have excessive sleepiness.  Take medicines as directed by your health care provider.  For some people, aspirin or blood thinners (anticoagulants) are helpful in reducing the risk of forming abnormal blood clots that can lead to stroke. If you have the irregular heart rhythm of atrial fibrillation, you should be on a blood thinner unless there is a good reason you cannot take them.  Understand all your medicine instructions.  Make sure that other other conditions (such as anemia or atherosclerosis) are addressed. SEEK IMMEDIATE MEDICAL CARE IF:   You have sudden weakness or numbness of the face, arm, or leg, especially on one side of the body.  Your face or eyelid droops to one side.  You have sudden confusion.  You have trouble speaking (aphasia) or understanding.  You have sudden trouble seeing in one or both eyes.  You have sudden trouble walking.  You have dizziness.  You have a loss of balance or coordination.  You have a sudden, severe headache with no known cause.  You have new chest pain or an irregular heartbeat. Any of these symptoms may represent a serious problem that is an emergency. Do not wait to see if the symptoms will go away. Get medical help at once. Call your local emergency services  (911 in [place] Do not drive yourself to the hospital. Document Released: 05/13/2004 Document Revised: 01/24/2013 Document Reviewed: 10/06/2012 Exit[employer] Patient Information 2014 [employer].   Smoking Cessation Quitting smoking is important to your health and has many advantages. However, it is not always easy to quit since nicotine is a   very addictive drug. Often times, people try 3 times or more before being able to quit. This document explains the best ways for you to prepare to quit smoking. Quitting takes hard work and a lot of effort, but you can do it. ADVANTAGES OF QUITTING SMOKING  You will live longer, feel better,  and live better.  Your body will feel the impact of quitting smoking almost immediately.  Within 20 minutes, blood pressure decreases. Your pulse returns to its normal level.  After 8 hours, carbon monoxide levels in the blood return to normal. Your oxygen level increases.  After 24 hours, the chance of having a heart attack starts to decrease. Your breath, hair, and body stop smelling like smoke.  After 48 hours, damaged nerve endings begin to recover. Your sense of taste and smell improve.  After 72 hours, the body is virtually free of nicotine. Your bronchial tubes relax and breathing becomes easier.  After 2 to 12 weeks, lungs can hold more air. Exercise becomes easier and circulation improves.  The risk of having a heart attack, stroke, cancer, or lung disease is greatly reduced.  After 1 year, the risk of coronary heart disease is cut in half.  After 5 years, the risk of stroke falls to the same as a nonsmoker.  After 10 years, the risk of lung cancer is cut in half and the risk of other cancers decreases significantly.  After 15 years, the risk of coronary heart disease drops, usually to the level of a nonsmoker.  If you are pregnant, quitting smoking will improve your chances of having a healthy baby.  The people you live with, especially any children, will be healthier.  You will have extra money to spend on things other than cigarettes. QUESTIONS TO THINK ABOUT BEFORE ATTEMPTING TO QUIT You may want to talk about your answers with your caregiver.  Why do you want to quit?  If you tried to quit in the past, what helped and what did not?  What will be the most difficult situations for you after you quit? How will you plan to handle them?  Who can help you through the tough times? Your family? Friends? A caregiver?  What pleasures do you get from smoking? What ways can you still get pleasure if you quit? Here are some questions to ask your caregiver:  How can you  help me to be successful at quitting?  What medicine do you think would be best for me and how should I take it?  What should I do if I need more help?  What is smoking withdrawal like? How can I get information on withdrawal? GET READY  Set a quit date.  Change your environment by getting rid of all cigarettes, ashtrays, matches, and lighters in your home, car, or work. Do not let people smoke in your home.  Review your past attempts to quit. Think about what worked and what did not. GET SUPPORT AND ENCOURAGEMENT You have a better chance of being successful if you have help. You can get support in many ways.  Tell your family, friends, and co-workers that you are going to quit and need their support. Ask them not to smoke around you.  Get individual, group, or telephone counseling and support. Programs are available at local hospitals and health centers. Call your local health department for information about programs in your area.  Spiritual beliefs and practices may help some smokers quit.  Download a "quit meter" on your computer   to keep track of quit statistics, such as how long you have gone without smoking, cigarettes not smoked, and money saved.  Get a self-help book about quitting smoking and staying off of tobacco. LEARN NEW SKILLS AND BEHAVIORS  Distract yourself from urges to smoke. Talk to someone, go for a walk, or occupy your time with a task.  Change your normal routine. Take a different route to work. Drink tea instead of coffee. Eat breakfast in a different place.  Reduce your stress. Take a hot bath, exercise, or read a book.  Plan something enjoyable to do every day. Reward yourself for not smoking.  Explore interactive web-based programs that specialize in helping you quit. GET MEDICINE AND USE IT CORRECTLY Medicines can help you stop smoking and decrease the urge to smoke. Combining medicine with the above behavioral methods and support can greatly increase  your chances of successfully quitting smoking.  Nicotine replacement therapy helps deliver nicotine to your body without the negative effects and risks of smoking. Nicotine replacement therapy includes nicotine gum, lozenges, inhalers, nasal sprays, and skin patches. Some may be available over-the-counter and others require a prescription.  Antidepressant medicine helps people abstain from smoking, but how this works is unknown. This medicine is available by prescription.  Nicotinic receptor partial agonist medicine simulates the effect of nicotine in your brain. This medicine is available by prescription. Ask your caregiver for advice about which medicines to use and how to use them based on your health history. Your caregiver will tell you what side effects to look out for if you choose to be on a medicine or therapy. Carefully read the information on the package. Do not use any other product containing nicotine while using a nicotine replacement product.  RELAPSE OR DIFFICULT SITUATIONS Most relapses occur within the first 3 months after quitting. Do not be discouraged if you start smoking again. Remember, most people try several times before finally quitting. You may have symptoms of withdrawal because your body is used to nicotine. You may crave cigarettes, be irritable, feel very hungry, cough often, get headaches, or have difficulty concentrating. The withdrawal symptoms are only temporary. They are strongest when you first quit, but they will go away within 10 14 days. To reduce the chances of relapse, try to:  Avoid drinking alcohol. Drinking lowers your chances of successfully quitting.  Reduce the amount of caffeine you consume. Once you quit smoking, the amount of caffeine in your body increases and can give you symptoms, such as a rapid heartbeat, sweating, and anxiety.  Avoid smokers because they can make you want to smoke.  Do not let weight gain distract you. Many smokers will gain  weight when they quit, usually less than 10 pounds. Eat a healthy diet and stay active. You can always lose the weight gained after you quit.  Find ways to improve your mood other than smoking. FOR MORE INFORMATION  www.smokefree.gov  Document Released: 03/30/2001 Document Revised: 10/05/2011 Document Reviewed: 07/15/2011 [employer] Patient Information 2014 [employer].  

## 2013-08-16 NOTE — Addendum Note (Signed)
Addended by: Mena Goes on: 08/16/2013 02:01 PM   Modules accepted: Orders

## 2013-08-16 NOTE — Progress Notes (Signed)
Established Carotid Patient   History of Present Illness  Joseph Forbes is a 73 y.o. male patient of Dr. Evelena Leyden seen status post bilateral endarterectomies in 2010. He returns today for routine surveillance.  Patient has Negative history of TIA or stroke symptom.  The patient denies amaurosis fugax or monocular blindness.  The patient  denies facial drooping.  Pt. denies hemiplegia.  The patient denies receptive or expressive aphasia.   Pt denies claudication symptoms in legs with walking. 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 Pt states his breathing is no worse lately than usual.   Pt enies New Medical or Surgical History.  Pt Diabetic: Yes, states in good control Pt smoker: smoker  (1 ppd , started at age 67 yrs)  Pt meds include: Statin : Yes ASA: Yes Other anticoagulants/antiplatelets: no   Past Medical History  Diagnosis Date  . CAD (coronary artery disease)     Normal LV function .Status post stent placement to the right coronary artery in 2000 and 2001Obstructive coronary artery disease in November 2008 by catheterization   . Hyperlipidemia   . COPD (chronic obstructive pulmonary disease)   . Carotid artery disease   . Hypertension     pt denies, no meds    Social History History  Substance Use Topics  . Smoking status: Current Every Day Smoker -- 1.00 packs/day for 55 years    Types: Cigarettes  . Smokeless tobacco: Never Used  . Alcohol Use: No    Family History Family History  Problem Relation Age of Onset  . Heart disease Mother     Before age 8  . Heart disease Father   . Heart attack Father     Surgical History Past Surgical History  Procedure Laterality Date  . Cardiac catheterization  02/2007  . Hydrocele excision Right 04/24/2013    Procedure: HYDROCELECTOMY ADULT;  Surgeon: Hanley Ben, MD;  Location: [facility];  Service: Urology;  Laterality: Right;    Allergies  Allergen Reactions  . Other  Itching and Rash    Some medication OTC he took for headache caused a rash    Current Outpatient Prescriptions  Medication Sig Dispense Refill  . albuterol-ipratropium (COMBIVENT) 18-103 MCG/ACT inhaler Inhale 2 puffs into the lungs every 6 (six) hours as needed.        Marland Kitchen aspirin EC 81 MG tablet Take 1 tablet (81 mg total) by mouth daily.  30 tablet  3  . famotidine (PEPCID) 20 MG tablet Take 20 mg by mouth daily.       Marland Kitchen HYDROcodone-acetaminophen (NORCO) 5-325 MG per tablet Take 1 tablet by mouth every 6 (six) hours as needed for moderate pain.  30 tablet  0  . nitroGLYCERIN (NITROSTAT) 0.4 MG SL tablet Place 0.4 mg under the tongue every 5 (five) minutes as needed.        Marland Kitchen PROAIR HFA 108 (90 BASE) MCG/ACT inhaler continuous as needed.      . simvastatin (ZOCOR) 40 MG tablet Take 40 mg by mouth at bedtime.         No current facility-administered medications for this visit.    Review of Systems : See HPI for pertinent positives and negatives.  Physical Examination  Filed Vitals:   08/16/13 1141  BP: 129/69  Pulse: 72  Resp: 16   Filed Weights   08/16/13 1141  Weight: 181 lb (82.101 kg)   Body mass index is 22.62 kg/(m^2).   General:  WDWN male in NAD GAIT: normal Eyes: PERRLA Pulmonary:  Non-labored, Positive  Rales, Positive rhonchi, & Positive wheezing, chronic moist cough.  Cardiac: regular Rhythm ,  Negative detected murmur.  VASCULAR EXAM Carotid Bruits Left Right   Negative Negative    Aorta is not palpable. Radial pulses are 2+ palpable and equal.                                                                                                                       Gastrointestinal: soft, nontender, BS WNL, no r/g,  negative masses.  Musculoskeletal: Negative muscle atrophy/wasting. M/S 5/5 throughout, Extremities without ischemic changes.  Neurologic: A&O X 3; Appropriate Affect ; SENSATION ;normal;  Speech is normal CN 2-12 intact except some hearing loss,  Pain and light touch intact in extremities, Motor exam as listed above.   Non-Invasive Vascular Imaging CAROTID DUPLEX 08/16/2013   CEREBROVASCULAR DUPLEX EVALUATION    INDICATION: Carotid stenosis    PREVIOUS INTERVENTION(S): Right carotid endarterectomy 07/05/2008; Left carotid endarterectomy 01/08/2002    DUPLEX EXAM:     RIGHT  LEFT  Peak Systolic Velocities (cm/s) End Diastolic Velocities (cm/s) Plaque LOCATION Peak Systolic Velocities (cm/s) End Diastolic Velocities (cm/s) Plaque  107 16  CCA PROXIMAL 129 28   80 14  CCA MID 100 25 HT  73 13 HT CCA DISTAL 103 21 HT  156 20  ECA 142 18 HT  52 16  ICA PROXIMAL 90 19 HT  95 32  ICA MID 83 23 HT  106 29  ICA DISTAL 109 30     carotid endarterectomy ICA / CCA Ratio (PSV) carotid endarterectomy   Antegrade Vertebral Flow Antegrade  627 Brachial Systolic Pressure (mmHg) 035  Triphasic Brachial Artery Waveforms Triphasic    Plaque Morphology:  HM = Homogeneous, HT = Heterogeneous, CP = Calcific Plaque, SP = Smooth Plaque, IP = Irregular Plaque     ADDITIONAL FINDINGS:     IMPRESSION: Right internal carotid artery is patent with history of carotid endarterectomy, mild hyperplasia present at the proximal patch without hemodynamically significant changes present. Left common carotid artery disease present. Left internal carotid artery is patent with history of carotid endarterectomy, no hyperplasia present however heterogeneous plaque is present suggestive of stenosis of less than 40%.    Compared to the previous exam:  Essentially unchanged since previous study on 07/31/2012.      Assessment: Joseph Forbes is a 73 y.o. male who status post bilateral endarterectomies in 2010.  He has never had any stroke or TIA activity. Right internal carotid artery is patent with history of carotid endarterectomy, mild hyperplasia present at the proximal patch without hemodynamically significant changes present. Left common carotid artery  disease present. Left internal carotid artery is patent with history of carotid endarterectomy, no hyperplasia present however heterogeneous plaque is present suggestive of stenosis of less than 40%. Essentially unchanged since previous study on 07/31/2012. His atherosclerotic risk factors include smoking, and DM, dyslipidemia, and CAD, all under  medical management.  Plan:  Pt was counseled re smoking cessation. Follow-up in 1 year with Carotid Duplex scan.   I discussed in depth with the patient the nature of atherosclerosis, and emphasized the importance of maximal medical management including strict control of blood pressure, blood glucose, and lipid levels, obtaining regular exercise, and cessation of smoking.  The patient is aware that without maximal medical management the underlying atherosclerotic disease process will progress, limiting the benefit of any interventions. The patient was given information about stroke prevention and what symptoms should prompt the patient to seek immediate medical care. Thank you for allowing [place] to participate in this patient's care.  Clemon Chambers, RN, MSN, FNP-C [facility] Office: [facility] Physician: Oneida Alar  08/16/2013 11:57 AM

## 2014-02-05 ENCOUNTER — Encounter (HOSPITAL_COMMUNITY): Payer: Self-pay | Admitting: Pharmacy Technician

## 2014-02-08 NOTE — H&P (Addendum)
CHIEF COMPLAINT:  Painful left hip.   HISTORY OF PRESENT ILLNESS:  Joseph Forbes is a very pleasant 73 year old white male who is seen today for evaluation of his left hip and groin pain.  He has had this dating back to before September of this year in regards of pain and discomfort.  He has had corticosteroid injection, which had been only temporarily beneficial.  Only about 2-weeks of relief was noted.  MRI scan was obtained of the left hip, which revealed left hip iliopsoas bursitis with small left hip effusion.  Mild to moderate degenerative articular cartilage narrowing in the left hip with mild degenerative spurring of both femoral heads.  There was some abnormal blunting and thickening anterior, superior, left acetabulum.  Acetabular labrum, which favored degenerative tearing.  He returned after the MRI scan and was felt that the majority of his pain has been coming from his arthritis, however he does have 2 other problems; 1 the hypodensity in the prostate, as well as what appears to be some claudication lower extremities.  He states that he has seen someone for the prostate and they felt it was not carcinoma.  They also sent him off to be seen and evaluated for vascular studies and the arterial Doppler's were basically normal ABIs at rest with no significant occlusive vascular disease despite the mottling in his legs.  He is a cigarette smoker though.  He returns today for re-evaluation.     PAST MEDICAL/SURGICAL HISTORY:  His past medical history reveals 5 stents for his cardiac disease.  He did have a myocardial infarction around 12-years ago and this has been stented.  He has had 5 total stents at separate visits at [facility].  He had a hydrocelectomy in 2014.  ORIF right forearm in 1963.     CURRENT MEDICATIONS:   1.  Aspirin 225 mg daily. 2.  Famotidine 20 mg, 2 tablets daily. 3.  Mirtazapine 15 mg p.r.n. 4.  And an unknown inhaler.   ALLERGIES:  NO KNOWN DRUG ALLERGIES.    REVIEW  OF SYSTEMS:  A 14-point is negative except for upper dentures, history of pneumonia in 2014, and shortness of breath, probably on the basis of cigarette smoking.  He has been diagnosed with COPD probably on the basis of cigarettes.  He did have myocardial infarction 10-12 years ago and was stented and has had several stents since that time.  All other symptoms were denied.     FAMILY HISTORY:  Positive for a mother who had heart disease and an unknown cancer and was deceased at age 18 of unknown cause.  Father who died at age 100 from Alzheimer's.  He has 1 brother, 10, alive, and 1 sister, 83, who has breast cancer.    SOCIAL HISTORY:  He is a 73 year old white married male retired from [employer].  He smokes 1 pack of cigarettes per day for about 60-years.  Over the last 2-weeks he cut it down to a pack every 3 days.  He does continue to smoke.  He does not use alcohol.     PHYSICAL EXAMINATION:   Height is 6 feet 2.5 inches, weight is 183 pounds, BMI 23.2.  Vital signs:  Temperature 97.2, pulse 100, respirations 12, blood pressure 142/86.  Head is normal cephalic.  Eyes, pupils, equal, round, and reactive to light and accommodation with extraocular movement intact.  Ears, nose, and throat were benign.  Chest had good expansion.  Lungs had wheezes bilaterally, more  on the right than on the left.  Cardiac had regular rhythm and rate, normal S1, S2, no murmur.  Pulses were trace to 1+ in the lower extremities bilaterally and symmetric.  No carotid bruits were noted.  Abdomen:  Scaphoid soft, nontender, no mass palpable, normal bowel sounds present.  CNS:  He is oriented x3 and cranial nerves II-XII grossly intact.  Breast, rectal, and genital exam not indicated for a orthopedic evaluation.  Musculoskeletal:  He has certainly limited range of motion with internal/external rotation secondary to pain.  I could only get about 20 degrees from neutral.  I can flex him to about 90 degrees.  He walks with a  mild antalgic limp.     CLINICAL IMPRESSION:   1.    Primary endstage osteoarthritis left hip. 2.  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   3.    Normal vascular studies.   4.    COPD/Cigarette smoker   RECOMMENDATIONS:  At this time, I have reviewed consultations from [place], which they felt that just to hold his aspirin for surgery and he was, from their standpoint, felt to be cardiac stable enough for surgery.  Also Dr. Scotty Court cleared him from both cardiac and medical, but considered whether hospitalist or pulmonologist for followup postop and while he was in the hospital would be beneficial.   Therefore, our plan is to proceed with total hip replacement on the left.  Procedure, risks, and benefits were fully explained to him and he is understanding.  I used appropriate devices to show him what the total hip would be.  I have gone over his hospital course in detail with him.  Plan is for him to go home.    Joseph Forbes, [place] [phone]  02/19/2014 6:59 AM

## 2014-02-11 ENCOUNTER — Encounter (HOSPITAL_COMMUNITY)
Admission: RE | Admit: 2014-02-11 | Discharge: 2014-02-11 | Disposition: A | Discharge status: Home / Self Care | Payer: Medicare Other | Source: Ambulatory Visit | Attending: Orthopedic Surgery | Admitting: Orthopedic Surgery

## 2014-02-11 ENCOUNTER — Encounter (HOSPITAL_COMMUNITY)
Admission: RE | Admit: 2014-02-11 | Discharge: 2014-02-11 | Disposition: A | Discharge status: Home / Self Care | Payer: Medicare Other | Source: Ambulatory Visit | Attending: Orthopaedic Surgery | Admitting: Orthopaedic Surgery

## 2014-02-11 ENCOUNTER — Encounter (HOSPITAL_COMMUNITY): Payer: Self-pay

## 2014-02-11 DIAGNOSIS — Z720 Tobacco use: Secondary | ICD-10-CM | POA: Diagnosis not present

## 2014-02-11 DIAGNOSIS — M1612 Unilateral primary osteoarthritis, left hip: Secondary | ICD-10-CM | POA: Diagnosis not present

## 2014-02-11 DIAGNOSIS — Z955 Presence of coronary angioplasty implant and graft: Secondary | ICD-10-CM | POA: Insufficient documentation

## 2014-02-11 DIAGNOSIS — J449 Chronic obstructive pulmonary disease, unspecified: Secondary | ICD-10-CM | POA: Diagnosis not present

## 2014-02-11 DIAGNOSIS — Z01818 Encounter for other preprocedural examination: Secondary | ICD-10-CM | POA: Insufficient documentation

## 2014-02-11 DIAGNOSIS — I2510 Atherosclerotic heart disease of native coronary artery without angina pectoris: Secondary | ICD-10-CM | POA: Insufficient documentation

## 2014-02-11 DIAGNOSIS — Z87891 Personal history of nicotine dependence: Secondary | ICD-10-CM | POA: Diagnosis not present

## 2014-02-11 DIAGNOSIS — E785 Hyperlipidemia, unspecified: Secondary | ICD-10-CM | POA: Insufficient documentation

## 2014-02-11 DIAGNOSIS — K219 Gastro-esophageal reflux disease without esophagitis: Secondary | ICD-10-CM | POA: Insufficient documentation

## 2014-02-11 DIAGNOSIS — I10 Essential (primary) hypertension: Secondary | ICD-10-CM | POA: Diagnosis not present

## 2014-02-11 DIAGNOSIS — J984 Other disorders of lung: Secondary | ICD-10-CM | POA: Diagnosis not present

## 2014-02-11 DIAGNOSIS — Z951 Presence of aortocoronary bypass graft: Secondary | ICD-10-CM | POA: Diagnosis not present

## 2014-02-11 HISTORY — DX: Gastro-esophageal reflux disease without esophagitis: K21.9

## 2014-02-11 LAB — TYPE AND SCREEN
ABO/RH(D): O POS
Antibody Screen: NEGATIVE

## 2014-02-11 LAB — CBC WITH DIFFERENTIAL/PLATELET
BASOS ABS: 0.1 10*3/uL (ref 0.0–0.1)
BASOS PCT: 1 % (ref 0–1)
Eosinophils Absolute: 0.1 10*3/uL (ref 0.0–0.7)
Eosinophils Relative: 2 % (ref 0–5)
HEMATOCRIT: 47 % (ref 39.0–52.0)
Hemoglobin: 16.4 g/dL (ref 13.0–17.0)
LYMPHS PCT: 33 % (ref 12–46)
Lymphs Abs: 2 10*3/uL (ref 0.7–4.0)
MCH: 32.6 pg (ref 26.0–34.0)
MCHC: 34.9 g/dL (ref 30.0–36.0)
MCV: 93.4 fL (ref 78.0–100.0)
Monocytes Absolute: 0.6 10*3/uL (ref 0.1–1.0)
Monocytes Relative: 9 % (ref 3–12)
NEUTROS ABS: 3.4 10*3/uL (ref 1.7–7.7)
NEUTROS PCT: 55 % (ref 43–77)
PLATELETS: 233 10*3/uL (ref 150–400)
RBC: 5.03 MIL/uL (ref 4.22–5.81)
RDW: 11.7 % (ref 11.5–15.5)
WBC: 6 10*3/uL (ref 4.0–10.5)

## 2014-02-11 LAB — APTT: APTT: 27 s (ref 24–37)

## 2014-02-11 LAB — COMPREHENSIVE METABOLIC PANEL
ALT: 12 U/L (ref 0–53)
AST: 22 U/L (ref 0–37)
Albumin: 3.9 g/dL (ref 3.5–5.2)
Alkaline Phosphatase: 98 U/L (ref 39–117)
Anion gap: 14 (ref 5–15)
BILIRUBIN TOTAL: 0.3 mg/dL (ref 0.3–1.2)
BUN: 11 mg/dL (ref 6–23)
CO2: 25 meq/L (ref 19–32)
CREATININE: 0.99 mg/dL (ref 0.50–1.35)
Calcium: 10 mg/dL (ref 8.4–10.5)
Chloride: 100 mEq/L (ref 96–112)
GFR calc Af Amer: >90 mL/min (ref >90)
GFR, EST NON AFRICAN AMERICAN: 80 mL/min — AB (ref >90)
Glucose, Bld: 82 mg/dL (ref 70–99)
POTASSIUM: 4.6 meq/L (ref 3.7–5.3)
Sodium: 139 mEq/L (ref 137–147)
Total Protein: 7.5 g/dL (ref 6.0–8.3)

## 2014-02-11 LAB — SURGICAL PCR SCREEN
MRSA, PCR: NEGATIVE
Staphylococcus aureus: NEGATIVE

## 2014-02-11 LAB — PROTIME-INR
INR: 1.03 (ref 0.00–1.49)
Prothrombin Time: 13.6 seconds (ref 11.6–15.2)

## 2014-02-11 LAB — ABO/RH: ABO/RH(D): O POS

## 2014-02-11 NOTE — Progress Notes (Addendum)
Have requested "cardiac clearnace " note from Dr. Rudene Anda office.   DA Aaron Edelman P's note added to chart..  Urine specimen was unusable.  Trying to contact, Dr. Durward Fortes, or Aaron Edelman to see if we need another.   DA Left message @ office to see whether we need to collect UA DOS or to just forget it.   DA Office would like [place] to try and obtain a urine on the AM of surgery--order placed.   DA

## 2014-02-11 NOTE — Pre-Procedure Instructions (Signed)
Joseph Forbes  02/11/2014   Your procedure is scheduled on: Tuesday, February 19, 2014  Report to [facility] Admitting at 8:15 AM.  Call this number if you have problems the morning of surgery: [phone]   Remember:   Do not eat food or drink liquids after midnight Monday, February 19, 2014   Take these medicines the morning of surgery with A SIP OF WATER: famotidine (PEPCID), if needed: Nitrostat for chest pain, inhalers for wheezing ( bring inhalers with you on the day of procedure).  Stop taking Aspirin, vitamins, and herbal medications. Do not take any NSAIDs ie: Ibuprofen, Advil, Naproxen or any medication containing Aspirin; stop 5 days prior to procedure ( Friday, February 15, 2014).   Do not wear jewelry, make-up or nail polish.  Do not wear lotions, powders, or perfumes. You may not wear deodorant.  Do not shave 48 hours prior to surgery. Men may shave face and neck.  Do not bring valuables to the hospital.  [facility] is not responsible for any belongings or valuables.               Contacts, dentures or bridgework may not be worn into surgery.  Leave suitcase in the car. After surgery it may be brought to your room.  For patients admitted to the hospital, discharge time is determined by your treatment team.               Patients discharged the day of surgery will not be allowed to drive home.   Name and phone number of your driver:   Joseph Forbes    Son   Special Instructions:  Special Instructions:Special Instructions: [facility] - Preparing for Surgery  Before surgery, you can play an important role.  Because skin is not sterile, your skin needs to be as free of germs as possible.  You can reduce the number of germs on you skin by washing with CHG (chlorahexidine gluconate) soap before surgery.  CHG is an antiseptic cleaner which kills germs and bonds with the skin to continue killing germs even after washing.  Please DO NOT use if you have an allergy to  CHG or antibacterial soaps.  If your skin becomes reddened/irritated stop using the CHG and inform your nurse when you arrive at Short Stay.  Do not shave (including legs and underarms) for at least 48 hours prior to the first CHG shower.  You may shave your face.  Please follow these instructions carefully:   1.  Shower with CHG Soap the night before surgery and the morning of Surgery.  2.  If you choose to wash your hair, wash your hair first as usual with your normal shampoo.  3.  After you shampoo, rinse your hair and body thoroughly to remove the Shampoo.  4.  Use CHG as you would any other liquid soap.  You can apply chg directly  to the skin and wash gently with scrungie or a clean washcloth.  5.  Apply the CHG Soap to your body ONLY FROM THE NECK DOWN.  Do not use on open wounds or open sores.  Avoid contact with your eyes, ears, mouth and genitals (private parts).  Wash genitals (private parts) with your normal soap.  6.  Wash thoroughly, paying special attention to the area where your surgery will be performed.  7.  Thoroughly rinse your body with warm water from the neck down.  8.  DO NOT shower/wash with your normal  soap after using and rinsing off the CHG Soap.  9.  Pat yourself dry with a clean towel.            10.  Wear clean pajamas.            11.  Place clean sheets on your bed the night of your first shower and do not sleep with pets.  Day of Surgery  Do not apply any lotions/deodorants the morning of surgery.  Please wear clean clothes to the hospital/surgery center.   Please read over the following fact sheets that you were given: Pain Booklet, Coughing and Deep Breathing, Blood Transfusion Information, Total Joint Packet, MRSA Information and Surgical Site Infection Prevention

## 2014-02-12 NOTE — Progress Notes (Signed)
Anesthesia Chart Review:  Patient is a 73 year old male scheduled for left THA on 02/19/14 by Dr. Durward Fortes.    History includes smoking, CAD s/p RCA stents '00 and '01, HLD, HTN, COPD, carotid occlusive disease s/p bilateral CEA '10 (Dr. Tinnie Gens), GERD, hydrocelectomy '15. Cardiologist is Dr. Carlyle Dolly who cleared patient for surgery from a cardiac standpoint.  His H&P also indicates that PCP Dr. Scotty Court cleared him from a medical standpoint with consideration of hospitalist or pulmonologist post-operative follow-up.    EKG on 02/11/14 showed: NSR.  He had a normal stress echo on 03/24/11.  Last cardiac cath on showed: Left main was normal. The LAD was calcified from the ostium down to the mid segment. There was an ostial 40-50% lesion. There was long proximal 30-40% stenosis. First and second diagonals were moderate sized and relatively normal without high-grade lesions. The circ in the AV groove had ostial 30-40% stenosis. There was mid obtuse marginal which was  moderate sized and normal. Second mid obtuse marginal was moderate sized and normal. The right coronary artery is a very large dominant vessel. There were proximal and mid stents. There was mild luminal irregularities. The PDA was moderate sized and normal. Left ventriculogram exam was obtained in the RAO projection. The EF was 55% with normal wall motion. Medical therapy was recommended.  Carotid duplex on 08/16/13: Right internal carotid artery is patent with history of carotid endarterectomy, mild hyperplasia present at the proximal patch without hemodynamically significant changes present. Left common carotid artery disease present.  Left internal carotid artery is patent with history of carotid endarterectomy, no hyperplasia present however heterogeneous plaque is present suggestive of stenosis of less than 40%.   CXR on 02/11/14 showed: COPD. No acute findings.  Preoperative labs noted. Urine culture is still pending.   If  no acute changes then I would anticipate that he could proceed as planned.  George Hugh [facility] Short Stay Center/Anesthesiology Phone [phone] 02/12/2014 3:40 PM

## 2014-02-13 LAB — URINE CULTURE
CULTURE: NO GROWTH
Colony Count: NO GROWTH

## 2014-02-18 MED ORDER — CEFAZOLIN SODIUM-DEXTROSE 2-3 GM-% IV SOLR
2.0000 g | INTRAVENOUS | Status: AC
  Administered 2014-02-19: 2 g via INTRAVENOUS
  Filled 2014-02-18: qty 50

## 2014-02-18 MED ORDER — SODIUM CHLORIDE 0.9 % IV SOLN: INTRAVENOUS | Status: DC

## 2014-02-18 MED ORDER — ACETAMINOPHEN 10 MG/ML IV SOLN
1000.0000 mg | Freq: Four times a day (QID) | INTRAVENOUS | Status: DC
  Administered 2014-02-19: 1000 mg via INTRAVENOUS
  Filled 2014-02-18: qty 100

## 2014-02-18 MED ORDER — CHLORHEXIDINE GLUCONATE 4 % EX LIQD
60.0000 mL | Freq: Every day | CUTANEOUS | Status: DC
  Filled 2014-02-18: qty 60

## 2014-02-18 MED ORDER — CHLORHEXIDINE GLUCONATE 4 % EX LIQD
60.0000 mL | Freq: Once | CUTANEOUS | Status: DC
  Filled 2014-02-18: qty 60

## 2014-02-19 ENCOUNTER — Inpatient Hospital Stay (HOSPITAL_COMMUNITY): Payer: Medicare Other | Admitting: Vascular Surgery

## 2014-02-19 ENCOUNTER — Encounter (HOSPITAL_COMMUNITY)
Admission: RE | Disposition: A | Discharge status: Home Health | Payer: Self-pay | Source: Ambulatory Visit | Attending: Orthopaedic Surgery

## 2014-02-19 ENCOUNTER — Inpatient Hospital Stay (HOSPITAL_COMMUNITY)
Admission: RE | Admit: 2014-02-19 | Discharge: 2014-02-21 | DRG: 470 | Disposition: A | Discharge status: Home Health | Payer: Medicare Other | Source: Ambulatory Visit | Attending: Orthopaedic Surgery | Admitting: Orthopaedic Surgery

## 2014-02-19 ENCOUNTER — Inpatient Hospital Stay (HOSPITAL_COMMUNITY): Payer: Medicare Other | Admitting: Anesthesiology

## 2014-02-19 ENCOUNTER — Inpatient Hospital Stay (HOSPITAL_COMMUNITY): Payer: Medicare Other

## 2014-02-19 ENCOUNTER — Encounter (HOSPITAL_COMMUNITY): Payer: Self-pay | Admitting: Surgery

## 2014-02-19 DIAGNOSIS — Z96649 Presence of unspecified artificial hip joint: Secondary | ICD-10-CM

## 2014-02-19 DIAGNOSIS — M5136 Other intervertebral disc degeneration, lumbar region: Secondary | ICD-10-CM | POA: Diagnosis present

## 2014-02-19 DIAGNOSIS — Z955 Presence of coronary angioplasty implant and graft: Secondary | ICD-10-CM | POA: Diagnosis not present

## 2014-02-19 DIAGNOSIS — J4489 Other specified chronic obstructive pulmonary disease: Secondary | ICD-10-CM | POA: Diagnosis present

## 2014-02-19 DIAGNOSIS — Z9889 Other specified postprocedural states: Secondary | ICD-10-CM

## 2014-02-19 DIAGNOSIS — Z7982 Long term (current) use of aspirin: Secondary | ICD-10-CM | POA: Diagnosis not present

## 2014-02-19 DIAGNOSIS — K219 Gastro-esophageal reflux disease without esophagitis: Secondary | ICD-10-CM | POA: Diagnosis present

## 2014-02-19 DIAGNOSIS — M25452 Effusion, left hip: Secondary | ICD-10-CM | POA: Diagnosis present

## 2014-02-19 DIAGNOSIS — M25552 Pain in left hip: Secondary | ICD-10-CM | POA: Diagnosis present

## 2014-02-19 DIAGNOSIS — E785 Hyperlipidemia, unspecified: Secondary | ICD-10-CM | POA: Diagnosis present

## 2014-02-19 DIAGNOSIS — M1612 Unilateral primary osteoarthritis, left hip: Secondary | ICD-10-CM | POA: Diagnosis present

## 2014-02-19 DIAGNOSIS — D62 Acute posthemorrhagic anemia: Secondary | ICD-10-CM | POA: Diagnosis not present

## 2014-02-19 DIAGNOSIS — J449 Chronic obstructive pulmonary disease, unspecified: Secondary | ICD-10-CM | POA: Diagnosis present

## 2014-02-19 DIAGNOSIS — R062 Wheezing: Secondary | ICD-10-CM

## 2014-02-19 DIAGNOSIS — I10 Essential (primary) hypertension: Secondary | ICD-10-CM | POA: Diagnosis present

## 2014-02-19 DIAGNOSIS — M4806 Spinal stenosis, lumbar region: Secondary | ICD-10-CM | POA: Diagnosis present

## 2014-02-19 DIAGNOSIS — M2570 Osteophyte, unspecified joint: Secondary | ICD-10-CM | POA: Diagnosis present

## 2014-02-19 DIAGNOSIS — Z820 Family history of epilepsy and other diseases of the nervous system: Secondary | ICD-10-CM

## 2014-02-19 DIAGNOSIS — Z803 Family history of malignant neoplasm of breast: Secondary | ICD-10-CM

## 2014-02-19 DIAGNOSIS — F17210 Nicotine dependence, cigarettes, uncomplicated: Secondary | ICD-10-CM | POA: Diagnosis present

## 2014-02-19 DIAGNOSIS — I252 Old myocardial infarction: Secondary | ICD-10-CM

## 2014-02-19 DIAGNOSIS — I2510 Atherosclerotic heart disease of native coronary artery without angina pectoris: Secondary | ICD-10-CM | POA: Diagnosis present

## 2014-02-19 HISTORY — PX: JOINT REPLACEMENT: SHX530

## 2014-02-19 HISTORY — PX: TOTAL HIP ARTHROPLASTY: SHX124

## 2014-02-19 LAB — URINALYSIS, ROUTINE W REFLEX MICROSCOPIC
BILIRUBIN URINE: NEGATIVE
GLUCOSE, UA: NEGATIVE mg/dL
Hgb urine dipstick: NEGATIVE
Ketones, ur: NEGATIVE mg/dL
LEUKOCYTES UA: NEGATIVE
NITRITE: NEGATIVE
PH: 6.5 (ref 5.0–8.0)
Protein, ur: NEGATIVE mg/dL
Specific Gravity, Urine: 1.016 (ref 1.005–1.030)
Urobilinogen, UA: 0.2 mg/dL (ref 0.0–1.0)

## 2014-02-19 SURGERY — ARTHROPLASTY, HIP, TOTAL,POSTERIOR APPROACH
Anesthesia: General | Site: Hip | Laterality: Left

## 2014-02-19 MED ORDER — BISACODYL 10 MG RE SUPP: 10.0000 mg | Freq: Every day | RECTAL | Status: DC | PRN

## 2014-02-19 MED ORDER — BUPIVACAINE-EPINEPHRINE (PF) 0.25% -1:200000 IJ SOLN
INTRAMUSCULAR | Status: AC
  Filled 2014-02-19: qty 30

## 2014-02-19 MED ORDER — ALUM & MAG HYDROXIDE-SIMETH 200-200-20 MG/5ML PO SUSP: 30.0000 mL | ORAL | Status: DC | PRN

## 2014-02-19 MED ORDER — MENTHOL 3 MG MT LOZG: 1.0000 | LOZENGE | OROMUCOSAL | Status: DC | PRN

## 2014-02-19 MED ORDER — PROPOFOL 10 MG/ML IV BOLUS
INTRAVENOUS | Status: DC | PRN
  Administered 2014-02-19: 150 mg via INTRAVENOUS

## 2014-02-19 MED ORDER — ONDANSETRON HCL 4 MG/2ML IJ SOLN
INTRAMUSCULAR | Status: AC
  Filled 2014-02-19: qty 2

## 2014-02-19 MED ORDER — ONDANSETRON HCL 4 MG/2ML IJ SOLN: 4.0000 mg | Freq: Four times a day (QID) | INTRAMUSCULAR | Status: DC | PRN

## 2014-02-19 MED ORDER — VECURONIUM BROMIDE 10 MG IV SOLR
INTRAVENOUS | Status: AC
  Filled 2014-02-19: qty 10

## 2014-02-19 MED ORDER — NEOSTIGMINE METHYLSULFATE 10 MG/10ML IV SOLN
INTRAVENOUS | Status: DC | PRN
  Administered 2014-02-19: 3 mg via INTRAVENOUS

## 2014-02-19 MED ORDER — BUPIVACAINE HCL (PF) 0.25 % IJ SOLN
INTRAMUSCULAR | Status: AC
  Filled 2014-02-19: qty 30

## 2014-02-19 MED ORDER — OXYCODONE HCL 5 MG PO TABS
5.0000 mg | ORAL_TABLET | ORAL | Status: DC | PRN
  Administered 2014-02-21 (×2): 10 mg via ORAL
  Filled 2014-02-19 (×2): qty 2

## 2014-02-19 MED ORDER — ACETAMINOPHEN 10 MG/ML IV SOLN
1000.0000 mg | Freq: Four times a day (QID) | INTRAVENOUS | Status: AC
  Administered 2014-02-19 – 2014-02-20 (×4): 1000 mg via INTRAVENOUS
  Filled 2014-02-19 (×6): qty 100

## 2014-02-19 MED ORDER — HYDROMORPHONE HCL 1 MG/ML IJ SOLN: 0.5000 mg | INTRAMUSCULAR | Status: DC | PRN

## 2014-02-19 MED ORDER — METHOCARBAMOL 1000 MG/10ML IJ SOLN
500.0000 mg | Freq: Four times a day (QID) | INTRAMUSCULAR | Status: DC | PRN
  Filled 2014-02-19: qty 5

## 2014-02-19 MED ORDER — ONDANSETRON HCL 4 MG PO TABS: 4.0000 mg | ORAL_TABLET | Freq: Four times a day (QID) | ORAL | Status: DC | PRN

## 2014-02-19 MED ORDER — LABETALOL HCL 5 MG/ML IV SOLN
INTRAVENOUS | Status: DC | PRN
  Administered 2014-02-19: 5 mg via INTRAVENOUS

## 2014-02-19 MED ORDER — DEXAMETHASONE SODIUM PHOSPHATE 4 MG/ML IJ SOLN
INTRAMUSCULAR | Status: AC
  Filled 2014-02-19: qty 1

## 2014-02-19 MED ORDER — RIVAROXABAN 10 MG PO TABS
10.0000 mg | ORAL_TABLET | ORAL | Status: DC
  Administered 2014-02-20 – 2014-02-21 (×2): 10 mg via ORAL
  Filled 2014-02-19 (×3): qty 1

## 2014-02-19 MED ORDER — FAMOTIDINE 20 MG PO TABS
20.0000 mg | ORAL_TABLET | Freq: Two times a day (BID) | ORAL | Status: DC
  Administered 2014-02-20 – 2014-02-21 (×3): 20 mg via ORAL
  Filled 2014-02-19 (×6): qty 1

## 2014-02-19 MED ORDER — METOCLOPRAMIDE HCL 5 MG/ML IJ SOLN: 5.0000 mg | Freq: Three times a day (TID) | INTRAMUSCULAR | Status: DC | PRN

## 2014-02-19 MED ORDER — IPRATROPIUM-ALBUTEROL 18-103 MCG/ACT IN AERO: 2.0000 | INHALATION_SPRAY | Freq: Four times a day (QID) | RESPIRATORY_TRACT | Status: DC | PRN

## 2014-02-19 MED ORDER — LIDOCAINE HCL (CARDIAC) 20 MG/ML IV SOLN
INTRAVENOUS | Status: AC
  Filled 2014-02-19: qty 5

## 2014-02-19 MED ORDER — GLYCOPYRROLATE 0.2 MG/ML IJ SOLN
INTRAMUSCULAR | Status: DC | PRN
  Administered 2014-02-19: 0.4 mg via INTRAVENOUS

## 2014-02-19 MED ORDER — LIDOCAINE HCL (CARDIAC) 20 MG/ML IV SOLN
INTRAVENOUS | Status: DC | PRN
  Administered 2014-02-19 (×3): 50 mg via INTRAVENOUS
  Administered 2014-02-19: 100 mg via INTRAVENOUS
  Administered 2014-02-19 (×2): 50 mg via INTRAVENOUS

## 2014-02-19 MED ORDER — METHOCARBAMOL 500 MG PO TABS
500.0000 mg | ORAL_TABLET | Freq: Four times a day (QID) | ORAL | Status: DC | PRN
  Filled 2014-02-19: qty 1

## 2014-02-19 MED ORDER — PHENOL 1.4 % MT LIQD: 1.0000 | OROMUCOSAL | Status: DC | PRN

## 2014-02-19 MED ORDER — SODIUM CHLORIDE 0.9 % IV SOLN
75.0000 mL/h | INTRAVENOUS | Status: DC
  Administered 2014-02-19: 75 mL/h via INTRAVENOUS

## 2014-02-19 MED ORDER — NITROGLYCERIN 0.4 MG SL SUBL: 0.4000 mg | SUBLINGUAL_TABLET | SUBLINGUAL | Status: DC | PRN

## 2014-02-19 MED ORDER — DEXAMETHASONE SODIUM PHOSPHATE 4 MG/ML IJ SOLN
INTRAMUSCULAR | Status: DC | PRN
  Administered 2014-02-19: 4 mg via INTRAVENOUS

## 2014-02-19 MED ORDER — PROPOFOL 10 MG/ML IV BOLUS
INTRAVENOUS | Status: AC
  Filled 2014-02-19: qty 20

## 2014-02-19 MED ORDER — FLEET ENEMA 7-19 GM/118ML RE ENEM: 1.0000 | ENEMA | Freq: Once | RECTAL | Status: AC | PRN

## 2014-02-19 MED ORDER — SODIUM CHLORIDE 0.9 % IR SOLN
Status: DC | PRN
  Administered 2014-02-19: 1

## 2014-02-19 MED ORDER — ALBUTEROL SULFATE HFA 108 (90 BASE) MCG/ACT IN AERS: 1.0000 | INHALATION_SPRAY | RESPIRATORY_TRACT | Status: DC | PRN

## 2014-02-19 MED ORDER — GLYCOPYRROLATE 0.2 MG/ML IJ SOLN
INTRAMUSCULAR | Status: AC
  Filled 2014-02-19: qty 2

## 2014-02-19 MED ORDER — KETOROLAC TROMETHAMINE 15 MG/ML IJ SOLN
7.5000 mg | Freq: Four times a day (QID) | INTRAMUSCULAR | Status: AC
  Administered 2014-02-19 – 2014-02-20 (×4): 7.5 mg via INTRAVENOUS
  Filled 2014-02-19 (×4): qty 1

## 2014-02-19 MED ORDER — CEFAZOLIN SODIUM-DEXTROSE 2-3 GM-% IV SOLR
2.0000 g | Freq: Four times a day (QID) | INTRAVENOUS | Status: AC
  Administered 2014-02-19 – 2014-02-20 (×2): 2 g via INTRAVENOUS
  Filled 2014-02-19 (×2): qty 50

## 2014-02-19 MED ORDER — DOCUSATE SODIUM 100 MG PO CAPS
100.0000 mg | ORAL_CAPSULE | Freq: Two times a day (BID) | ORAL | Status: DC
  Administered 2014-02-19 – 2014-02-21 (×4): 100 mg via ORAL
  Filled 2014-02-19 (×6): qty 1

## 2014-02-19 MED ORDER — LACTATED RINGERS IV SOLN
INTRAVENOUS | Status: DC | PRN
  Administered 2014-02-19 (×2): via INTRAVENOUS

## 2014-02-19 MED ORDER — ACETAMINOPHEN 325 MG PO TABS
650.0000 mg | ORAL_TABLET | Freq: Four times a day (QID) | ORAL | Status: DC | PRN
  Administered 2014-02-21: 650 mg via ORAL
  Filled 2014-02-19 (×3): qty 2

## 2014-02-19 MED ORDER — FENTANYL CITRATE 0.05 MG/ML IJ SOLN
INTRAMUSCULAR | Status: AC
  Filled 2014-02-19: qty 5

## 2014-02-19 MED ORDER — PHENYLEPHRINE 40 MCG/ML (10ML) SYRINGE FOR IV PUSH (FOR BLOOD PRESSURE SUPPORT)
PREFILLED_SYRINGE | INTRAVENOUS | Status: AC
  Filled 2014-02-19: qty 10

## 2014-02-19 MED ORDER — HYDROMORPHONE HCL 1 MG/ML IJ SOLN
0.2500 mg | INTRAMUSCULAR | Status: DC | PRN
  Administered 2014-02-19 (×2): 0.25 mg via INTRAVENOUS

## 2014-02-19 MED ORDER — STERILE WATER FOR INJECTION IJ SOLN
INTRAMUSCULAR | Status: AC
  Filled 2014-02-19: qty 10

## 2014-02-19 MED ORDER — ROCURONIUM BROMIDE 100 MG/10ML IV SOLN
INTRAVENOUS | Status: DC | PRN
  Administered 2014-02-19: 50 mg via INTRAVENOUS

## 2014-02-19 MED ORDER — ROCURONIUM BROMIDE 50 MG/5ML IV SOLN
INTRAVENOUS | Status: AC
  Filled 2014-02-19: qty 1

## 2014-02-19 MED ORDER — ACETAMINOPHEN 650 MG RE SUPP: 650.0000 mg | Freq: Four times a day (QID) | RECTAL | Status: DC | PRN

## 2014-02-19 MED ORDER — LACTATED RINGERS IV SOLN
INTRAVENOUS | Status: DC
  Administered 2014-02-19: 09:00:00 via INTRAVENOUS

## 2014-02-19 MED ORDER — PHENYLEPHRINE HCL 10 MG/ML IJ SOLN
INTRAMUSCULAR | Status: DC | PRN
  Administered 2014-02-19: 40 ug via INTRAVENOUS
  Administered 2014-02-19: 80 ug via INTRAVENOUS

## 2014-02-19 MED ORDER — METOCLOPRAMIDE HCL 10 MG PO TABS: 5.0000 mg | ORAL_TABLET | Freq: Three times a day (TID) | ORAL | Status: DC | PRN

## 2014-02-19 MED ORDER — ALBUTEROL SULFATE (2.5 MG/3ML) 0.083% IN NEBU
2.5000 mg | INHALATION_SOLUTION | RESPIRATORY_TRACT | Status: DC | PRN
  Administered 2014-02-21: 2.5 mg via RESPIRATORY_TRACT
  Filled 2014-02-19: qty 3

## 2014-02-19 MED ORDER — NEOSTIGMINE METHYLSULFATE 10 MG/10ML IV SOLN
INTRAVENOUS | Status: AC
  Filled 2014-02-19: qty 1

## 2014-02-19 MED ORDER — ONDANSETRON HCL 4 MG/2ML IJ SOLN: 4.0000 mg | Freq: Once | INTRAMUSCULAR | Status: DC | PRN

## 2014-02-19 MED ORDER — POLYETHYLENE GLYCOL 3350 17 G PO PACK: 17.0000 g | PACK | Freq: Every day | ORAL | Status: DC | PRN

## 2014-02-19 MED ORDER — ONDANSETRON HCL 4 MG/2ML IJ SOLN
INTRAMUSCULAR | Status: DC | PRN
  Administered 2014-02-19: 4 mg via INTRAVENOUS

## 2014-02-19 MED ORDER — IPRATROPIUM-ALBUTEROL 0.5-2.5 (3) MG/3ML IN SOLN: 3.0000 mL | Freq: Four times a day (QID) | RESPIRATORY_TRACT | Status: DC | PRN

## 2014-02-19 MED ORDER — HYDROMORPHONE HCL 1 MG/ML IJ SOLN
INTRAMUSCULAR | Status: AC
  Filled 2014-02-19: qty 1

## 2014-02-19 MED ORDER — MIDAZOLAM HCL 2 MG/2ML IJ SOLN
INTRAMUSCULAR | Status: AC
  Filled 2014-02-19: qty 2

## 2014-02-19 MED ORDER — FENTANYL CITRATE 0.05 MG/ML IJ SOLN
INTRAMUSCULAR | Status: DC | PRN
  Administered 2014-02-19: 50 ug via INTRAVENOUS
  Administered 2014-02-19: 100 ug via INTRAVENOUS

## 2014-02-19 SURGICAL SUPPLY — 62 items
BLADE SAW SAG 73X25 THK (BLADE) ×2
BLADE SAW SGTL 73X25 THK (BLADE) ×1 IMPLANT
BRUSH FEMORAL CANAL (MISCELLANEOUS) IMPLANT
CAPT HIP PF MOP ×3 IMPLANT
COVER BACK TABLE 24X17X13 BIG (DRAPES) IMPLANT
COVER SURGICAL LIGHT HANDLE (MISCELLANEOUS) ×3 IMPLANT
DRAPE IMP U-DRAPE 54X76 (DRAPES) ×3 IMPLANT
DRAPE INCISE IOBAN 66X45 STRL (DRAPES) IMPLANT
DRAPE ORTHO SPLIT 77X108 STRL (DRAPES) ×6
DRAPE SURG ORHT 6 SPLT 77X108 (DRAPES) ×2 IMPLANT
DRSG MEPILEX BORDER 4X12 (GAUZE/BANDAGES/DRESSINGS) ×3 IMPLANT
DRSG MEPILEX BORDER 4X8 (GAUZE/BANDAGES/DRESSINGS) ×2 IMPLANT
DURAPREP 26ML APPLICATOR (WOUND CARE) ×6 IMPLANT
ELECT BLADE 6.5 EXT (BLADE) IMPLANT
ELECT REM PT RETURN 9FT ADLT (ELECTROSURGICAL) ×3
ELECTRODE REM PT RTRN 9FT ADLT (ELECTROSURGICAL) ×1 IMPLANT
EVACUATOR 1/8 PVC DRAIN (DRAIN) IMPLANT
FACESHIELD WRAPAROUND (MASK) ×9 IMPLANT
FACESHIELD WRAPAROUND OR TEAM (MASK) ×3 IMPLANT
GLOVE BIOGEL PI IND STRL 8 (GLOVE) ×2 IMPLANT
GLOVE BIOGEL PI IND STRL 8.5 (GLOVE) ×1 IMPLANT
GLOVE BIOGEL PI INDICATOR 8 (GLOVE) ×4
GLOVE BIOGEL PI INDICATOR 8.5 (GLOVE) ×2
GLOVE ECLIPSE 8.0 STRL XLNG CF (GLOVE) ×3 IMPLANT
GLOVE SURG ORTHO 8.5 STRL (GLOVE) ×6 IMPLANT
GOWN STRL REUS W/ TWL LRG LVL3 (GOWN DISPOSABLE) ×2 IMPLANT
GOWN STRL REUS W/TWL 2XL LVL3 (GOWN DISPOSABLE) ×6 IMPLANT
GOWN STRL REUS W/TWL LRG LVL3 (GOWN DISPOSABLE) ×6
HANDPIECE INTERPULSE COAX TIP (DISPOSABLE)
IMMOBILIZER KNEE 20 (SOFTGOODS) IMPLANT
IMMOBILIZER KNEE 22 UNIV (SOFTGOODS) ×2 IMPLANT
IMMOBILIZER KNEE 24 THIGH 36 (MISCELLANEOUS) IMPLANT
IMMOBILIZER KNEE 24 UNIV (MISCELLANEOUS)
KIT BASIN OR (CUSTOM PROCEDURE TRAY) ×3 IMPLANT
KIT ROOM TURNOVER OR (KITS) ×3 IMPLANT
MANIFOLD NEPTUNE II (INSTRUMENTS) ×3 IMPLANT
NEEDLE 22X1 1/2 (OR ONLY) (NEEDLE) ×3 IMPLANT
NS IRRIG 1000ML POUR BTL (IV SOLUTION) ×3 IMPLANT
PACK TOTAL JOINT (CUSTOM PROCEDURE TRAY) ×3 IMPLANT
PACK UNIVERSAL I (CUSTOM PROCEDURE TRAY) ×3 IMPLANT
PAD ARMBOARD 7.5X6 YLW CONV (MISCELLANEOUS) ×6 IMPLANT
PRESSURIZER FEMORAL UNIV (MISCELLANEOUS) IMPLANT
SET HNDPC FAN SPRY TIP SCT (DISPOSABLE) IMPLANT
STAPLER VISISTAT 35W (STAPLE) ×3 IMPLANT
SUCTION FRAZIER TIP 10 FR DISP (SUCTIONS) ×3 IMPLANT
SUT BONE WAX W31G (SUTURE) IMPLANT
SUT ETHIBOND NAB CT1 #1 30IN (SUTURE) ×7 IMPLANT
SUT MNCRL AB 3-0 PS2 18 (SUTURE) ×3 IMPLANT
SUT VIC AB 0 CT1 27 (SUTURE)
SUT VIC AB 0 CT1 27XBRD ANBCTR (SUTURE) IMPLANT
SUT VIC AB 1 CT1 27 (SUTURE) ×6
SUT VIC AB 1 CT1 27XBRD ANBCTR (SUTURE) ×2 IMPLANT
SUT VIC AB 2-0 CT1 27 (SUTURE) ×3
SUT VIC AB 2-0 CT1 TAPERPNT 27 (SUTURE) ×1 IMPLANT
SYR CONTROL 10ML LL (SYRINGE) IMPLANT
TOWEL OR 17X24 6PK STRL BLUE (TOWEL DISPOSABLE) ×3 IMPLANT
TOWEL OR 17X26 10 PK STRL BLUE (TOWEL DISPOSABLE) ×3 IMPLANT
TOWER CARTRIDGE SMART MIX (DISPOSABLE) IMPLANT
TRAY FOLEY CATH 16FRSI W/METER (SET/KITS/TRAYS/PACK) ×3 IMPLANT
WATER STERILE IRR 1000ML POUR (IV SOLUTION) ×12 IMPLANT
WRAP KNEE MAXI GEL POST OP (GAUZE/BANDAGES/DRESSINGS) ×3 IMPLANT
YANKAUER SUCT BULB TIP NO VENT (SUCTIONS) ×2 IMPLANT

## 2014-02-19 NOTE — Transfer of Care (Signed)
Immediate Anesthesia Transfer of Care Note  Patient: Joseph Forbes  Procedure(s) Performed: Procedure(s): TOTAL HIP ARTHROPLASTY (Left)  Patient Location: PACU  Anesthesia Type:General  Level of Consciousness: awake, alert , oriented and patient cooperative  Airway & Oxygen Therapy: Patient Spontanous Breathing and Patient connected to nasal cannula oxygen  Post-op Assessment: Report given to PACU RN and Post -op Vital signs reviewed and stable  Post vital signs: Reviewed  Complications: No apparent anesthesia complications

## 2014-02-19 NOTE — Op Note (Signed)
PATIENT ID:      Joseph Forbes  MRN:     [id number] DOB/AGE:    01/04/1941 / 73 y.o.       OPERATIVE REPORT    DATE OF PROCEDURE:  02/19/2014       PREOPERATIVE DIAGNOSIS:   LEFT HIP OSTEOARTHRITIS-END STAGE                                                       Estimated body mass index is 23.11 kg/(m^2) as calculated from the following:   Height as of this encounter: 6' 2.5" (1.892 m).   Weight as of this encounter: 82.725 kg (182 lb 6 oz).     POSTOPERATIVE DIAGNOSIS:   LEFT HIP OSTEOARTHRITIS-END STAGE                                                                     Estimated body mass index is 23.11 kg/(m^2) as calculated from the following:   Height as of this encounter: 6' 2.5" (1.892 m).   Weight as of this encounter: 82.725 kg (182 lb 6 oz).     PROCEDURE:  Procedure(s): TOTAL HIP ARTHROPLASTY left     SURGEON:  [name], MD    ASSISTANT:   Biagio Borg, PA-C   (Present and scrubbed throughout the case, critical for assistance with exposure, retraction, instrumentation, and closure.)          ANESTHESIA: general     DRAINS: none :      TOURNIQUET TIME: * No tourniquets in log *    COMPLICATIONS:  None   CONDITION:  stable  PROCEDURE IN DETAIL: 224825   [name], [name] 02/19/2014, 1:38 PM

## 2014-02-19 NOTE — Progress Notes (Signed)
Utilization review completed.  

## 2014-02-19 NOTE — Anesthesia Preprocedure Evaluation (Addendum)
Anesthesia Evaluation  Patient identified by MRN, date of birth, ID band Patient awake    Reviewed: Allergy & Precautions, H&P , NPO status , Patient's Chart, lab work & pertinent test results  Airway        Dental   Pulmonary COPDCurrent Smoker,          Cardiovascular hypertension, + CAD, + Cardiac Stents and + Peripheral Vascular Disease     Neuro/Psych    GI/Hepatic GERD-  ,  Endo/Other    Renal/GU      Musculoskeletal   Abdominal   Peds  Hematology   Anesthesia Other Findings   Reproductive/Obstetrics                            Anesthesia Physical Anesthesia Plan  ASA: III  Anesthesia Plan: General   Post-op Pain Management:    Induction: Intravenous  Airway Management Planned: Oral ETT  Additional Equipment:   Intra-op Plan:   Post-operative Plan: Extubation in OR  Informed Consent: I have reviewed the patients History and Physical, chart, labs and discussed the procedure including the risks, benefits and alternatives for the proposed anesthesia with the patient or authorized representative who has indicated his/her understanding and acceptance.     Plan Discussed with: CRNA, Anesthesiologist and Surgeon  Anesthesia Plan Comments:         Anesthesia Quick Evaluation

## 2014-02-19 NOTE — Anesthesia Procedure Notes (Signed)
Procedure Name: Intubation Date/Time: 02/19/2014 11:53 AM Performed by: Jenne Campus Pre-anesthesia Checklist: Patient identified, Emergency Drugs available, Suction available, Patient being monitored and Timeout performed Patient Re-evaluated:Patient Re-evaluated prior to inductionOxygen Delivery Method: Circle system utilized Preoxygenation: Pre-oxygenation with 100% oxygen Intubation Type: IV induction Ventilation: Mask ventilation without difficulty and Oral airway inserted - appropriate to patient size Laryngoscope Size: Miller and 3 Grade View: Grade I Tube type: Oral Tube size: 7.5 mm Number of attempts: 1 Airway Equipment and Method: Stylet Placement Confirmation: ETT inserted through vocal cords under direct vision,  positive ETCO2,  CO2 detector and breath sounds checked- equal and bilateral Secured at: 22 cm Tube secured with: Tape Dental Injury: Teeth and Oropharynx as per pre-operative assessment

## 2014-02-19 NOTE — Anesthesia Postprocedure Evaluation (Signed)
  Anesthesia Post-op Note  Patient: Joseph Forbes  Procedure(s) Performed: Procedure(s): TOTAL HIP ARTHROPLASTY (Left)  Patient Location: PACU  Anesthesia Type:General  Level of Consciousness: awake, alert , oriented and patient cooperative  Airway and Oxygen Therapy: Patient Spontanous Breathing  Post-op Pain: mild  Post-op Assessment: Post-op Vital signs reviewed, Patient's Cardiovascular Status Stable, Respiratory Function Stable, Patent Airway, No signs of Nausea or vomiting and Pain level controlled  Post-op Vital Signs: stable  Last Vitals:  Filed Vitals:   02/19/14 1629  BP: 151/70  Pulse: 76  Temp: 36.7 C  Resp: 16    Complications: No apparent anesthesia complications

## 2014-02-19 NOTE — H&P (Signed)
  The recent History & Physical has been reviewed. I have personally examined the patient today. There is no interval change to the documented History & Physical. The patient would like to proceed with the procedure.  [name] W 02/19/2014,  11:27 AM

## 2014-02-20 LAB — CBC
HEMATOCRIT: 33.9 % — AB (ref 39.0–52.0)
HEMOGLOBIN: 11.7 g/dL — AB (ref 13.0–17.0)
MCH: 31.2 pg (ref 26.0–34.0)
MCHC: 34.5 g/dL (ref 30.0–36.0)
MCV: 90.4 fL (ref 78.0–100.0)
Platelets: 196 10*3/uL (ref 150–400)
RBC: 3.75 MIL/uL — AB (ref 4.22–5.81)
RDW: 11.7 % (ref 11.5–15.5)
WBC: 12 10*3/uL — ABNORMAL HIGH (ref 4.0–10.5)

## 2014-02-20 LAB — BASIC METABOLIC PANEL
Anion gap: 11 (ref 5–15)
BUN: 15 mg/dL (ref 6–23)
CO2: 25 meq/L (ref 19–32)
Calcium: 8.6 mg/dL (ref 8.4–10.5)
Chloride: 102 mEq/L (ref 96–112)
Creatinine, Ser: 0.97 mg/dL (ref 0.50–1.35)
GFR calc Af Amer: >90 mL/min (ref >90)
GFR, EST NON AFRICAN AMERICAN: 81 mL/min — AB (ref >90)
GLUCOSE: 127 mg/dL — AB (ref 70–99)
Potassium: 4.8 mEq/L (ref 3.7–5.3)
SODIUM: 138 meq/L (ref 137–147)

## 2014-02-20 NOTE — Plan of Care (Signed)
Problem: Phase I Progression Outcomes Goal: Dangle or out of bed evening of surgery Outcome: Completed/Met Date Met:  02/20/14 Goal: Other Phase I Outcomes/Goals Outcome: Not Applicable Date Met:  15/50/27

## 2014-02-20 NOTE — Care Management Note (Signed)
CARE MANAGEMENT NOTE 02/20/2014  Patient:  Joseph Forbes, Joseph Forbes   Account Number:  [id number]  Date Initiated:  02/20/2014  Documentation initiated by:  Ricki Miller  Subjective/Objective Assessment:   73 yr old male admitted with left hip osteoarthritis.Patient had a left total hip arthroplasty.     Action/Plan:   Case manager spoke with patient and wife concerning home health and DME needs. Patient Preoperatively setup with [facility], no changes. Patient has 3in1. Has family support at discharge.   Anticipated DC Date:  02/21/2014   Anticipated DC Plan:  [facility] [facility]  CM consult      [place]   Choice offered to / List presented to:  C-1 Patient   DME arranged  Vassie Moselle      DME agency  [place] arranged  [place].   Status of service:  Completed, signed off Medicare Important Message given?   (If response is "NO", the following Medicare IM given date fields will be blank) Date Medicare IM given:   Medicare IM given by:   Date Additional Medicare IM given:   Additional Medicare IM given by:    Discharge Disposition:  [place]  Per UR Regulation:  Reviewed for med. necessity/level of care/duration of stay

## 2014-02-20 NOTE — Evaluation (Signed)
Physical Therapy Evaluation Patient Details Name: Joseph Forbes MRN: [id number] DOB: [birth year] Today's Date: 02/20/2014   History of Present Illness  73 y.o. male admitted to [facility] on 02/19/14 for elective left posterior THA.  He has significant PMHx of CAD, COPD (does not use O2 at home), HTN, R arm fx surgery, bil carotid enarterectomies, and cardiac stents.  Clinical Impression  Pt is POD #1 and is moving well.  He is min assist overall for gait into the hallway with RW.  He will need to be at a supervision level to d/c home safely as his wife walks with an assistive device.  The good news is that he does not have any stairs for home entry or inside his home.  I anticipate that he will progress well enough to go home with her assist and HHPT at discharge.   PT to follow acutely for deficits listed below.       Follow Up Recommendations Home health PT;Supervision for mobility/OOB    Equipment Recommendations  Rolling walker with 5" wheels    Recommendations for Other Services   NA    Precautions / Restrictions Precautions Precautions: Posterior Hip Precaution Booklet Issued: Yes (comment) Precaution Comments: precaution handout, and hip exercises given and reviewed with the pt and his wife.  Required Braces or Orthoses: Knee Immobilizer - Left Knee Immobilizer - Left: Other (comment) (for in bed positioning, does not need to be on when up ) Restrictions Weight Bearing Restrictions: Yes LLE Weight Bearing: Weight bearing as tolerated      Mobility  Bed Mobility Overal bed mobility: Needs Assistance Bed Mobility: Supine to Sit     Supine to sit: Min assist     General bed mobility comments: Min assist to support leg to get to EOB.  Verbal cues for sequencing  Transfers Overall transfer level: Needs assistance Equipment used: Rolling walker (2 wheeled) Transfers: Sit to/from Stand Sit to Stand: Min guard;From elevated surface         General transfer comment: Min  guard assist to support trunk to get to standing.  Pt is 6'3", so he may have difficulty from lower sitting surfaces like the recliner chair.   Ambulation/Gait Ambulation/Gait assistance: Min assist Ambulation Distance (Feet): 80 Feet Assistive device: Rolling walker (2 wheeled) Gait Pattern/deviations: Step-through pattern;Antalgic Gait velocity: decreased Gait velocity interpretation: Below normal speed for age/gender General Gait Details: Min assist to support trunk while WB on left leg.          Balance Overall balance assessment: Needs assistance Sitting-balance support: Feet supported;No upper extremity supported Sitting balance-Leahy Scale: Good     Standing balance support: Bilateral upper extremity supported;No upper extremity supported;Single extremity supported Standing balance-Leahy Scale: Fair                               Pertinent Vitals/Pain Pain Assessment: No/denies pain    Home Living Family/patient expects to be discharged to:: Private residence Living Arrangements: Spouse/significant other Available Help at Discharge: Family;Available 24 hours/day Type of Home: House Home Access: Ramped entrance     Home Layout: One level Home Equipment: Cane - quad;Cane - single point;Shower seat - built in;Toilet riser;Wheelchair - Education officer, community - power (walk in shower, Transport planner)      Prior Function Level of Independence: Independent         Comments: still  drives, retired,         Retail banker  Upper Extremity Assessment: Defer to OT evaluation           Lower Extremity Assessment: LLE deficits/detail   LLE Deficits / Details: left leg with normal post op pain and weakness.  Ankle 4/5, knee 3/5, hip 2+/5  Cervical / Trunk Assessment: Normal  Communication   Communication: HOH  Cognition Arousal/Alertness: Awake/alert Behavior During Therapy: WFL for tasks assessed/performed Overall Cognitive Status:  Within Functional Limits for tasks assessed                      General Comments General comments (skin integrity, edema, etc.): Pt desated on RA during gait with DOE 3-4/4.  O2 sats dropped to 80%, 2 L O2 [place] re applied as well as continuous pulse ox and O2 sats back to 96%.  IS use encouraged and pt able to preform x 5 reps in a row and >2000 mL each time.     Exercises Total Joint Exercises Ankle Circles/Pumps: AROM;Both;20 reps;Seated Quad Sets: AROM;Left;10 reps;Seated Short Arc Quad: AROM;Left;10 reps;Seated Heel Slides: AROM;Left;10 reps;Seated Hip ABduction/ADduction: AROM;Left;10 reps;Seated Long Arc Quad: AROM;Left;10 reps;Seated      Assessment/Plan    PT Assessment Patient needs continued PT services  PT Diagnosis Difficulty walking;Abnormality of gait;Generalized weakness;Acute pain   PT Problem List Decreased strength;Decreased activity tolerance;Decreased range of motion;Decreased balance;Decreased mobility;Decreased knowledge of use of DME;Cardiopulmonary status limiting activity;Decreased knowledge of precautions;Pain  PT Treatment Interventions DME instruction;Gait training;Functional mobility training;Therapeutic activities;Therapeutic exercise;Balance training;Neuromuscular re-education;Patient/family education;Manual techniques;Modalities   PT Goals (Current goals can be found in the Care Plan section) Acute Rehab PT Goals Patient Stated Goal: to be as independent as possible PT Goal Formulation: With patient/family Time For Goal Achievement: 02/27/14 Potential to Achieve Goals: Good    Frequency 7X/week           End of Session Equipment Utilized During Treatment: Gait belt Activity Tolerance: Other (comment) (limited by DOE) Patient left: in chair;with call bell/phone within reach;with family/visitor present Nurse Communication: Mobility status;Other (comment) (O2 sat drop)         Time: 3568-6168 PT Time Calculation (min): 35  min   Charges:   PT Evaluation $Initial PT Evaluation Tier I: 1 Procedure PT Treatments $Gait Training: 8-22 mins        Fable Huisman B. Cylinda Santoli, PT, DPT [phone]   02/20/2014, 12:41 PM

## 2014-02-20 NOTE — Plan of Care (Signed)
Problem: Phase I Progression Outcomes Goal: Pain controlled with appropriate interventions Outcome: Completed/Met Date Met:  02/20/14 Goal: Initial discharge plan identified Outcome: Completed/Met Date Met:  02/20/14 Goal: Hemodynamically stable Outcome: Completed/Met Date Met:  02/20/14  Problem: Phase II Progression Outcomes Goal: Ambulates Outcome: Completed/Met Date Met:  02/20/14 Goal: Tolerating diet Outcome: Completed/Met Date Met:  02/20/14 Goal: Discharge plan established Outcome: Completed/Met Date Met:  02/20/14

## 2014-02-20 NOTE — Discharge Instructions (Signed)
Information on my medicine - XARELTO (Rivaroxaban)  This medication education was reviewed with me or my healthcare representative as part of my discharge preparation.  The pharmacist that spoke with me during my hospital stay was:  Arty Baumgartner, [facility]  Why was Xarelto prescribed for you? Xarelto was prescribed for you to reduce the risk of blood clots forming after orthopedic surgery. The medical term for these abnormal blood clots is venous thromboembolism (VTE).  What do you need to know about xarelto ? Take your Xarelto  10 mg ONCE DAILY at the same time every day. You may take it either with or without food.  If you have difficulty swallowing the tablet whole, you may crush it and mix in applesauce just prior to taking your dose.  Take Xarelto exactly as prescribed by your doctor and DO NOT stop taking Xarelto without talking to the doctor who prescribed the medication.  Stopping without other VTE prevention medication to take the place of Xarelto may increase your risk of developing a clot.  After discharge, you should have regular check-up appointments with your healthcare provider that is prescribing your Xarelto.    What do you do if you miss a dose? If you miss a dose, take it as soon as you remember on the same day then continue your regularly scheduled once daily regimen the next day. Do not take two doses of Xarelto on the same day.   Important Safety Information A possible side effect of Xarelto is bleeding. You should call your healthcare provider right away if you experience any of the following: ? Bleeding from an injury or your nose that does not stop. ? Unusual colored urine (red or dark brown) or unusual colored stools (red or black). ? Unusual bruising for unknown reasons. ? A serious fall or if you hit your head (even if there is no bleeding).  Some medicines may interact with Xarelto and might increase your risk of bleeding while on Xarelto. To  help avoid this, consult your healthcare provider or pharmacist prior to using any new prescription or non-prescription medications, including herbals, vitamins, non-steroidal anti-inflammatory drugs (NSAIDs) and supplements.  This website has more information on Xarelto: [email or web address]

## 2014-02-20 NOTE — Progress Notes (Signed)
Physical Therapy Treatment Patient Details Name: Joseph Forbes MRN: [id number] DOB: Nov 25, 1940 Today's Date: 02/20/2014    History of Present Illness 73 y.o. male admitted to [facility] on 02/19/14 for elective left posterior THA.  He has significant PMHx of CAD, COPD (does not use O2 at home), HTN, R arm fx surgery, bil carotid enarterectomies, and cardiac stents.    PT Comments    This is pt's second session on POD #1 and he is progressing nicely.  Supervision for gait with RW.  Min assist only to help him get to standing and to lift his leg back into the bed.  He is progressing nicely with his exercises and we may try the standing exercises tomorrow.  PT will continue to follow acutely and continues to recommend HHPT at discharge (likely tomorrow).   Follow Up Recommendations  Home health PT;Supervision for mobility/OOB     Equipment Recommendations  Rolling walker with 5" wheels    Recommendations for Other Services   NA     Precautions / Restrictions Precautions Precautions: Posterior Hip Precaution Booklet Issued: Yes (comment) Precaution Comments: reviewed precautions pt able to report 2/3 without cues Required Braces or Orthoses: Knee Immobilizer - Left Knee Immobilizer - Left: Other (comment) (on for positioning in bed) Restrictions LLE Weight Bearing: Weight bearing as tolerated    Mobility  Bed Mobility Overal bed mobility: Needs Assistance Bed Mobility: Supine to Sit;Sit to Supine     Supine to sit: Modified independent (Device/Increase time);Min assist Sit to supine: Min assist   General bed mobility comments: Pt needed extra time to get to EOB due to difficulty moving his left leg, and needed min assist to help lift his left leg back into bed when going sit to supine.   Transfers Overall transfer level: Needs assistance Equipment used: Rolling walker (2 wheeled) Transfers: Sit to/from Stand Sit to Stand: Min guard         General transfer comment: Min guard  assist for safety due to lower bed and verbal cues for safe hand placement.   Ambulation/Gait Ambulation/Gait assistance: Supervision Ambulation Distance (Feet): 120 Feet Assistive device: Rolling walker (2 wheeled) Gait Pattern/deviations: Step-through pattern;Antalgic Gait velocity: decreased Gait velocity interpretation: Below normal speed for age/gender General Gait Details: Pt with mildly antalgic gait pattern DOE 2/4 and O2 sats remained in the 90s this afternoon on RA during gait.  Pt left on RA at end of session with continuous pulse ox on.           Balance Overall balance assessment: Needs assistance Sitting-balance support: Feet supported;No upper extremity supported Sitting balance-Leahy Scale: Good     Standing balance support: Bilateral upper extremity supported;No upper extremity supported;Single extremity supported Standing balance-Leahy Scale: Fair                      Cognition Arousal/Alertness: Awake/alert Behavior During Therapy: WFL for tasks assessed/performed Overall Cognitive Status: Within Functional Limits for tasks assessed                      Exercises Total Joint Exercises Ankle Circles/Pumps: AROM;Both;20 reps;Seated Quad Sets: AROM;Left;10 reps;Seated Short Arc Quad: AROM;Left;10 reps;Seated Heel Slides: AROM;Left;10 reps;Seated Hip ABduction/ADduction: AROM;Left;10 reps;Seated Long Arc Quad: AROM;Left;10 reps;Seated        Pertinent Vitals/Pain Pain Assessment: No/denies pain           PT Goals (current goals can now be found in the care plan section) Acute Rehab PT Goals  Patient Stated Goal: to go home tomorrow.  Progress towards PT goals: Progressing toward goals    Frequency  7X/week    PT Plan Current plan remains appropriate       End of Session   Activity Tolerance: Patient tolerated treatment well Patient left: in bed;with call bell/phone within reach     Time: 8270-7867 PT Time Calculation  (min): 15 min  Charges:  $Gait Training: 8-22 mins                      Kaydyn Chism B. Anayiah Howden, PT, DPT [phone]   02/20/2014, 4:08 PM

## 2014-02-20 NOTE — Progress Notes (Signed)
Patient ID: Joseph Forbes, male   DOB: [birth year], 73 y.o.   MRN: [id number] PATIENT ID: Joseph Forbes        MRN:  [id number]          DOB/AGE: [birth year] / 73 y.o.    Joseph Fears, MD   Biagio Borg, PA-C [address] [place], [place]  [address]                             [phone]   PROGRESS NOTE  Subjective:  negative for Chest Pain  negative for Shortness of Breath  negative for Nausea/Vomiting   negative for Calf Pain    Tolerating Diet: yes         Patient reports pain as mild.     Comfortable, sitting up eating breakfast-minimal discomfort  Objective: Vital signs in last 24 hours:   Patient Vitals for the past 24 hrs:  BP Temp Temp src Pulse Resp SpO2 Height Weight  02/20/14 0502 (!) 111/52 mmHg 97.9 F (36.6 C) Oral 63 17 96 % - -  02/20/14 0323 - - - - 18 - - -  02/20/14 0050 (!) 114/49 mmHg 98.1 F (36.7 C) Oral 76 16 97 % - -  02/19/14 2048 (!) 122/52 mmHg 98.4 F (36.9 C) Oral 80 18 97 % - -  02/19/14 1629 (!) 151/70 mmHg 98.1 F (36.7 C) Oral 76 16 100 % - -  02/19/14 1606 - 97.6 F (36.4 C) - - - - - -  02/19/14 1601 (!) 147/73 mmHg - - 66 (!) 9 100 % - -  02/19/14 1600 - - - 65 (!) 9 100 % - -  02/19/14 1545 - - - 68 (!) 9 100 % - -  02/19/14 1531 138/61 mmHg - - 64 10 100 % - -  02/19/14 1530 - - - 61 (!) 9 100 % - -  02/19/14 1515 - - - 61 (!) 8 100 % - -  02/19/14 1502 - - - (!) 53 10 100 % - -  02/19/14 1501 (!) 145/65 mmHg - - (!) 58 (!) 9 100 % - -  02/19/14 1500 - 97.6 F (36.4 C) - (!) 58 (!) 9 100 % - -  02/19/14 1446 (!) 143/57 mmHg - - (!) 58 (!) 9 100 % - -  02/19/14 1445 - - - (!) 56 (!) 9 100 % - -  02/19/14 1431 (!) 145/61 mmHg - - (!) 56 11 100 % - -  02/19/14 1430 - 97.6 F (36.4 C) - (!) 59 10 100 % - -  02/19/14 1416 (!) 155/89 mmHg - - 71 13 100 % - -  02/19/14 1415 - - - 73 11 100 % - -  02/19/14 1401 (!) 143/75 mmHg - - - (!) 25 (!) 85 % - -  02/19/14 1400 (!) 143/75 mmHg (!) 96.8 F (36 C) - 76 17 96 % - -    02/19/14 0824 (!) 170/74 mmHg 97.7 F (36.5 C) Oral 79 20 98 % 6' 2.5" (1.892 m) 82.725 kg (182 lb 6 oz)      Intake/Output from previous day:   11/03 0701 - 11/04 0700 In: 2751.3 [I.V.:2551.3] Out: 1100 [Urine:800]   Intake/Output this shift:       Intake/Output      11/03 0701 - 11/04 0700 11/04 0701 - 11/05 0700   I.V. (mL/kg) 2551.3 (30.8)  IV Piggyback 200    Total Intake(mL/kg) 2751.3 (33.3)    Urine (mL/kg/hr) 800    Blood 300    Total Output 1100     Net +1651.3             LABORATORY DATA:  Recent Labs  02/20/14 0543  WBC 12.0*  HGB 11.7*  HCT 33.9*  PLT 196    Recent Labs  02/20/14 0543  NA 138  K 4.8  CL 102  CO2 25  BUN 15  CREATININE 0.97  GLUCOSE 127*  CALCIUM 8.6   Lab Results  Component Value Date   INR 1.03 02/11/2014   INR 1.0 07/04/2008    Recent Radiographic Studies :  Chest 2 View  02/11/2014   CLINICAL DATA:  Left total hip arthroplasty thumb up personal history of coronary artery disease and COPD, smoker  EXAM: CHEST  2 VIEW  COMPARISON:  None.  FINDINGS: Hyperinflation consistent with COPD. Heart size and vascular pattern are normal. The lungs are clear. There are no pleural effusions. There is mild bilateral apical scarring. Coronary stent identified.  IMPRESSION: COPD.  No acute findings.   Electronically Signed   By: Skipper Cliche M.D.   On: 02/11/2014 11:09   Dg Pelvis 1-2 Views  02/20/2014   CLINICAL DATA:  Postop films for left total hip replacement. Left hip pain.  EXAM: PELVIS - 1-2 VIEW  COMPARISON:  None.  FINDINGS: Postoperative changes with left total hip arthroplasty using non cemented components. Components appear well seated. Skin clips consistent with recent surgery. Degenerative changes noted in the right hip. No acute bony abnormalities.  IMPRESSION: Left total hip arthroplasty.  Components appear well seated.   Electronically Signed   By: Lucienne Capers M.D.   On: 02/20/2014 00:07   Dg Hip Portable 1 View  Left  02/19/2014   CLINICAL DATA:  Postop from left total hip replacement. Left hip pain.  EXAM: PORTABLE LEFT HIP - 1 VIEW  COMPARISON:  Pelvis 02/19/2014  FINDINGS: Lateral view left hip demonstrates a left hip arthroplasty. Components appear well seated on this view. Surgical clips consistent with recent surgery.  IMPRESSION: Left hip arthroplasty.   Electronically Signed   By: Lucienne Capers M.D.   On: 02/19/2014 23:58     Examination:  General appearance: alert, cooperative and no distress  Wound Exam: clean, dry, intact   Drainage:  None: wound tissue dry  Motor Exam: EHL, FHL, Anterior Tibial and Posterior Tibial  intact  Sensory Exam: Superficial Peroneal, Deep Peroneal and Tibial normal  Vascular Exam: Normal  Assessment:    1 Day Post-Op  Procedure(s) (LRB): TOTAL HIP ARTHROPLASTY (Left)  ADDITIONAL DIAGNOSIS:  Active Problems:   S/P total hip arthroplasty     Plan: Physical Therapy as ordered Weight Bearing as Tolerated (WBAT)  DVT Prophylaxis:  Xarelto, Foot Pumps and TED hose  DISCHARGE PLAN: Home  DISCHARGE NEEDS: HHPT, Walker and 3-in-1 comode seat OOB with PT, foley out, PT, saline lock IV, hope for D/C in am     [facility] Rishav Rockefeller W  02/20/2014 8:08 AM

## 2014-02-20 NOTE — Evaluation (Signed)
Occupational Therapy Evaluation Patient Details Name: Joseph Forbes MRN: [id number] DOB: 02-Mar-1941 Today's Date: 02/20/2014    History of Present Illness 73 y.o. male admitted to [facility] on 02/19/14 for elective left posterior THA.  He has significant PMHx of CAD, COPD (does not use O2 at home), HTN, R arm fx surgery, bil carotid enarterectomies, and cardiac stents.   Clinical Impression   Pt admitted with the above diagnoses and presents with below problem list. Pt will benefit from continued acute OT to address the below listed deficits and maximize independence with basic ADLs prior to d/c home. PTA pt was independent with ADLs. Pt currently at min guard level with LB ADLs with use of AE. Pt at min guard level for transfers and functional mobility.       Follow Up Recommendations  No OT follow up;Other (comment) (Supervision for OOB/mobility)    Equipment Recommendations  None recommended by OT;Other (comment) (pt has recommended DME)    Recommendations for Other Services       Precautions / Restrictions Precautions Precautions: Posterior Hip Precaution Booklet Issued: Yes (comment) Precaution Comments: reviewed precautions Required Braces or Orthoses: Knee Immobilizer - Left Knee Immobilizer - Left: Other (comment) ((for in bed positioning, does not need to be on when up )) Restrictions Weight Bearing Restrictions: Yes LLE Weight Bearing: Weight bearing as tolerated      Mobility Bed Mobility Overal bed mobility: Needs Assistance Bed Mobility: Supine to Sit     Supine to sit: Min assist     General bed mobility comments: in recliner  Transfers Overall transfer level: Needs assistance Equipment used: Rolling walker (2 wheeled) Transfers: Sit to/from Stand Sit to Stand: Min guard         General transfer comment: from recliner, close min guard, extra effort and time    Balance Overall balance assessment: Needs assistance Sitting-balance support: Feet  supported;No upper extremity supported Sitting balance-Leahy Scale: Good     Standing balance support: Bilateral upper extremity supported;During functional activity Standing balance-Leahy Scale: Fair                              ADL Overall ADL's : Needs assistance/impaired Eating/Feeding: Set up;Sitting   Grooming: Set up;Sitting   Upper Body Bathing: Set up;Sitting   Lower Body Bathing: Min guard;With adaptive equipment;Sit to/from stand   Upper Body Dressing : Set up;Sitting   Lower Body Dressing: Min guard;With adaptive equipment;Sit to/from stand   Toilet Transfer: Min guard;Ambulation;RW (3n1 over toilet)   Toileting- Clothing Manipulation and Hygiene: Min guard;Sit to/from stand   Tub/ Shower Transfer: Min guard;Ambulation;3 in 1;Rolling walker   Functional mobility during ADLs: Min guard;Rolling walker General ADL Comments: Education on techniques, AE, and home setup for safe completion of ADLs with posterior hip recovery provided to pt and spouse. Cueing needed at times to maintain hip precautions while pivoting with rw.      Vision                     Perception     Praxis      Pertinent Vitals/Pain Pain Assessment: No/denies pain     Hand Dominance     Extremity/Trunk Assessment Upper Extremity Assessment Upper Extremity Assessment: Overall WFL for tasks assessed   Lower Extremity Assessment Lower Extremity Assessment: Defer to PT evaluation LLE Deficits / Details: left leg with normal post op pain and weakness.  Ankle 4/5, knee 3/5,  hip 2+/5   Cervical / Trunk Assessment Cervical / Trunk Assessment: Normal   Communication Communication Communication: HOH   Cognition Arousal/Alertness: Awake/alert Behavior During Therapy: WFL for tasks assessed/performed Overall Cognitive Status: Within Functional Limits for tasks assessed                     General Comments       Exercises      Shoulder Instructions       Home Living Family/patient expects to be discharged to:: Private residence Living Arrangements: Spouse/significant other Available Help at Discharge: Family;Available 24 hours/day Type of Home: House Home Access: Ramped entrance     Home Layout: One level     Bathroom Shower/Tub: Occupational psychologist: Standard Bathroom Accessibility: Yes How Accessible: Accessible via walker Home Equipment: Cane - quad;Cane - single point;Shower seat - built in;Toilet riser;Wheelchair - Education officer, community - power;Adaptive equipment;Bedside commode;Hand held shower head Adaptive Equipment: Reacher;Sock aid;Long-handled sponge Additional Comments: demostrated use of reacher and sock aid for assist with LB dressing      Prior Functioning/Environment Level of Independence: Independent        Comments: still  drives, retired,     OT Diagnosis: Acute pain   OT Problem List: Impaired balance (sitting and/or standing);Decreased knowledge of use of DME or AE;Decreased knowledge of precautions;Pain   OT Treatment/Interventions: Self-care/ADL training;DME and/or AE instruction;Therapeutic activities;Patient/family education;Balance training    OT Goals(Current goals can be found in the care plan section) Acute Rehab OT Goals Patient Stated Goal: not stated OT Goal Formulation: With patient/family Time For Goal Achievement: 02/27/14 Potential to Achieve Goals: Good ADL Goals Pt Will Perform Lower Body Dressing: with modified independence;with adaptive equipment;sit to/from stand Pt Will Perform Tub/Shower Transfer: with supervision;ambulating;3 in 1;rolling walker Additional ADL Goal #1: Pt will complete sit<>supine at supervision level to prepare for OOB ADLs.  OT Frequency: Min 2X/week   Barriers to D/C:            Co-evaluation              End of Session Equipment Utilized During Treatment: Gait belt;Rolling walker  Activity Tolerance: Patient tolerated treatment  well Patient left: in chair;with call bell/phone within reach;with family/visitor present   Time: 1230-1253 OT Time Calculation (min): 23 min Charges:  OT General Charges $OT Visit: 1 Procedure OT Evaluation $Initial OT Evaluation Tier I: 1 Procedure OT Treatments $Self Care/Home Management : 8-22 mins G-Codes:    Hortencia Pilar 07-Mar-2014, 1:08 PM

## 2014-02-20 NOTE — Op Note (Signed)
NAMEPATE, AYLWARD NO.:  [id number]  MEDICAL RECORD NO.:  [id number]  LOCATION:  [id number]                        FACILITY:  [facility]  PHYSICIAN:  Vonna Kotyk. Latrise Bowland, M.D.DATE OF BIRTH:  1941-01-22  DATE OF PROCEDURE:  02/19/2014 DATE OF DISCHARGE:                              OPERATIVE REPORT   PREOPERATIVE DIAGNOSIS:  End-stage osteoarthritis, left hip.  POSTOPERATIVE DIAGNOSIS:  End-stage osteoarthritis, left hip.  PROCEDURE:  Left total hip replacement.  SURGEON:  Vonna Kotyk. Durward Fortes, M.D.  ASSISTANT:  Aaron Edelman D. Petrarca, P.A.-C., who was present throughout the operative procedure to ensure its timely completion.  ANESTHESIA:  General.  COMPLICATIONS:  None.  COMPONENTS:  DePuy AML 16.5-mm large stature femoral component with a 36- mm outer diameter hip ball, 1.5-mm neck length, 54-mm outer diameter metallic acetabular component- Gription sector 3, a Marathon polyethylene liner with a 10-degree posterior lip.  DESCRIPTION OF PROCEDURE:  Mr. Poblete was met in the holding area, identified the left hip was appropriate operative site, marked it accordingly and the questions were answered.  The patient was then transported to room #7 and placed under general anesthesia without difficulty.  Catheter was inserted without difficulty.  Urine was clear.  The patient was then placed in the lateral decubitus position with the left side up and secured to the operating table with the Innomed hip system.  The left hip was then prepped with chlorhexidine scrub and then DuraPrep from iliac crest to below the knees to the ankles.  Sterile draping was performed.  Routine Southern incision was utilized and via sharp dissection, carried down to the subcutaneous tissue.  Any bleeders were Bovie coagulated. Self-retaining retractors were inserted.  The IP band was identified and incised along the length of the skin incision.  Retractors were placed more deeply.  With  the hip internally rotated, the short external rotators were identified and carefully incised near their attachment at the greater trochanter.  Any tendinous structures were tagged with 0 Ethibond suture.  Joint capsule was then identified.  It was carefully incised along the femoral neck and head.  The joint was entered.  There was a minimal clear yellow joint effusion.  The head was then dislocated posteriorly. At least 60-70% of the head was devoid of articular cartilage with an eburnated bone.  Using the calcar guide, the head was then osteotomized at the head and neck junction.  Soft tissue was resected from the posterior aspect of the greater trochanter at the piriformis fossa.  Small drill hole was then made. Reaming was performed sequentially to 16 mm to accept the 16.5 component.  Rasping was performed sequentially to a 16.5 rasp using the large stature, had a very nice fit on the calcar without need for the calcar reamer.  The location of the neck cut, there was about a fingerbreadth proximal to the lesser trochanter.  Retractors were then carefully placed about the acetabulum.  The labrum was excised.  There was a tear of the labrum inferiorly.  Retractor was carefully placed about the acetabulum.  I could palpate the sciatic nerve and did so throughout the procedure to be sure was out of harm's way.  Reaming was  started at 43 mm and increased to 53 mm to accept a 54 mm component.  I checked a 52 and a 54 component, felt that 54 would be an excellent fit, good rim fit without full seating.  The Gription 3 acetabular component was then impacted using the external acetabular guide with a very nice fit, was very stable.  I did not need the screws.  The trial polyethylene liner was then inserted followed by the 16.5 large stature femoral component and then the trial hip ball with a 1.5- mm neck length and the 36-mm outer diameter ball.  This was impacted in place.   Through a full range of motion, there was no instability, could not sublux the hip, appeared to be perfectly stable, was not tight in extension.  I felt like the leg lengths were symmetrical.  The trial components were removed.  The joint was copiously irrigated with saline solution.  The final acetabular component was impacted and felt like we had stripped the center holes, so I did not insert an apex hole eliminator.  I checked the polyethylene, it was stable.  The wound was again irrigated with saline solution.  The 16.5-mm large stature femoral component was then carefully impacted, flush on the calcar.  Again, we irrigated the wound, we cleaned the Morse taper neck, inserted the 36-mm outer diameter hip ball with a 1.5 neck.  Acetabulum was cleared of any loose material.  The joint was then located and again through a full range of motion, was perfectly stable.  Again, we irrigated the joint.  The capsule was closed anatomically with interrupted #1 Ethibond.  Short external rotator was closed with the similar material.  The IT band was closed with running #1 Vicryl.  Subcu was closed with 2-0 Vicryl and 3-0 Monocryl.  Skin was closed with skin clips.  Sterile bulky dressing was applied followed by a knee immobilizer after the patient was placed in the supine position.  The patient tolerated the procedure without complications.     Vonna Kotyk. Durward Fortes, M.D.     PWW/MEDQ  D:  02/19/2014  T:  02/19/2014  Job:  [id number]

## 2014-02-21 ENCOUNTER — Inpatient Hospital Stay (HOSPITAL_COMMUNITY): Payer: Medicare Other

## 2014-02-21 ENCOUNTER — Encounter (HOSPITAL_COMMUNITY): Payer: Self-pay | Admitting: Orthopaedic Surgery

## 2014-02-21 DIAGNOSIS — M1612 Unilateral primary osteoarthritis, left hip: Secondary | ICD-10-CM | POA: Diagnosis present

## 2014-02-21 DIAGNOSIS — J449 Chronic obstructive pulmonary disease, unspecified: Secondary | ICD-10-CM | POA: Diagnosis present

## 2014-02-21 LAB — CBC
HCT: 28.9 % — ABNORMAL LOW (ref 39.0–52.0)
HEMOGLOBIN: 9.9 g/dL — AB (ref 13.0–17.0)
MCH: 31.2 pg (ref 26.0–34.0)
MCHC: 34.3 g/dL (ref 30.0–36.0)
MCV: 91.2 fL (ref 78.0–100.0)
Platelets: 170 10*3/uL (ref 150–400)
RBC: 3.17 MIL/uL — AB (ref 4.22–5.81)
RDW: 11.9 % (ref 11.5–15.5)
WBC: 8.2 10*3/uL (ref 4.0–10.5)

## 2014-02-21 LAB — BASIC METABOLIC PANEL
Anion gap: 11 (ref 5–15)
BUN: 14 mg/dL (ref 6–23)
CALCIUM: 8.6 mg/dL (ref 8.4–10.5)
CO2: 25 meq/L (ref 19–32)
Chloride: 102 mEq/L (ref 96–112)
Creatinine, Ser: 0.9 mg/dL (ref 0.50–1.35)
GFR calc Af Amer: >90 mL/min (ref >90)
GFR calc non Af Amer: 83 mL/min — ABNORMAL LOW (ref >90)
GLUCOSE: 90 mg/dL (ref 70–99)
Potassium: 4.1 mEq/L (ref 3.7–5.3)
Sodium: 138 mEq/L (ref 137–147)

## 2014-02-21 MED ORDER — RIVAROXABAN 10 MG PO TABS: 10.0000 mg | ORAL_TABLET | ORAL | Status: DC

## 2014-02-21 MED ORDER — METHOCARBAMOL 500 MG PO TABS: 500.0000 mg | ORAL_TABLET | Freq: Two times a day (BID) | ORAL | Status: DC | PRN

## 2014-02-21 MED ORDER — OXYCODONE HCL 5 MG PO TABS: 5.0000 mg | ORAL_TABLET | ORAL | Status: DC | PRN

## 2014-02-21 NOTE — Progress Notes (Signed)
Patient ID: Joseph Forbes, male   DOB: 1941-03-09, 73 y.o.   MRN: [id number] PATIENT ID: Joseph Forbes        MRN:  [id number]          DOB/AGE: 29-Aug-1940 / 73 y.o.    Joseph Fears, MD   Joseph Borg, PA-C [address] [place], [place]  [address]                             [phone]   PROGRESS NOTE  Subjective:  negative for Chest Pain  negative for Shortness of Breath  negative for Nausea/Vomiting   negative for Calf Pain    Tolerating Diet: yes         Patient reports pain as mild.     Resting comfortably.  Denies SOB  Objective: Vital signs in last 24 hours:   Patient Vitals for the past 24 hrs:  BP Temp Temp src Pulse Resp SpO2  02/21/14 0607 (!) 109/49 mmHg 98.5 F (36.9 C) Oral 80 16 93 %  02/20/14 2021 (!) 122/55 mmHg 98.7 F (37.1 C) Oral 82 16 95 %  02/20/14 1603 - - - - - 93 %  02/20/14 1400 (!) 113/47 mmHg 98.6 F (37 C) - 74 18 98 %  02/20/14 1102 - - - - - (!) 80 %      Intake/Output from previous day:   11/04 0701 - 11/05 0700 In: 840 [P.O.:840] Out: 925 [Urine:925]   Intake/Output this shift:   11/05 0701 - 11/05 1900 In: -  Out: 400 [Urine:400]   Intake/Output      11/04 0701 - 11/05 0700 11/05 0701 - 11/06 0700   P.O. 840    I.V. (mL/kg)     IV Piggyback     Total Intake(mL/kg) 840 (10.2)    Urine (mL/kg/hr) 925 (0.5) 400 (3.3)   Blood     Total Output 925 400   Net -85 -400           LABORATORY DATA:  Recent Labs  02/20/14 0543 02/21/14 0500  WBC 12.0* 8.2  HGB 11.7* 9.9*  HCT 33.9* 28.9*  PLT 196 170    Recent Labs  02/20/14 0543 02/21/14 0500  NA 138 138  K 4.8 4.1  CL 102 102  CO2 25 25  BUN 15 14  CREATININE 0.97 0.90  GLUCOSE 127* 90  CALCIUM 8.6 8.6   Lab Results  Component Value Date   INR 1.03 02/11/2014   INR 1.0 07/04/2008    Recent Radiographic Studies :  Chest 2 View  02/11/2014   CLINICAL DATA:  Left total hip arthroplasty thumb up personal history of coronary artery disease and  COPD, smoker  EXAM: CHEST  2 VIEW  COMPARISON:  None.  FINDINGS: Hyperinflation consistent with COPD. Heart size and vascular pattern are normal. The lungs are clear. There are no pleural effusions. There is mild bilateral apical scarring. Coronary stent identified.  IMPRESSION: COPD.  No acute findings.   Electronically Signed   By: Skipper Cliche M.D.   On: 02/11/2014 11:09   Dg Pelvis 1-2 Views  02/20/2014   CLINICAL DATA:  Postop films for left total hip replacement. Left hip pain.  EXAM: PELVIS - 1-2 VIEW  COMPARISON:  None.  FINDINGS: Postoperative changes with left total hip arthroplasty using non cemented components. Components appear well seated. Skin clips consistent with recent surgery. Degenerative changes noted in the  right hip. No acute bony abnormalities.  IMPRESSION: Left total hip arthroplasty.  Components appear well seated.   Electronically Signed   By: Lucienne Capers M.D.   On: 02/20/2014 00:07   Dg Hip Portable 1 View Left  02/19/2014   CLINICAL DATA:  Postop from left total hip replacement. Left hip pain.  EXAM: PORTABLE LEFT HIP - 1 VIEW  COMPARISON:  Pelvis 02/19/2014  FINDINGS: Lateral view left hip demonstrates a left hip arthroplasty. Components appear well seated on this view. Surgical clips consistent with recent surgery.  IMPRESSION: Left hip arthroplasty.   Electronically Signed   By: Lucienne Capers M.D.   On: 02/19/2014 23:58     Examination:  General appearance: alert, cooperative and mild distress Resp: wheezes LLL Cardio: regular rate and rhythm GI: normal findings: bowel sounds normal  Wound Exam: clean, dry, intact   Drainage:  None: wound tissue dry  Motor Exam: EHL, FHL, Anterior Tibial and Posterior Tibial Intact  Sensory Exam: Superficial Peroneal, Deep Peroneal and Tibial normal  Vascular Exam: Left posterior tibial artery has trace pulse  Assessment:    2 Days Post-Op  Procedure(s) (LRB): TOTAL HIP ARTHROPLASTY (Left)  ADDITIONAL  DIAGNOSIS:  Active Problems:   S/P total hip arthroplasty  Acute Blood Loss Anemia asymptomatic   Plan: Physical Therapy as ordered Weight Bearing as Tolerated (WBAT)  DVT Prophylaxis:  Xarelto, Foot Pumps and TED hose  DISCHARGE PLAN: Home  DISCHARGE NEEDS: HHPT, Walker and 3-in-1 comode seat   Obtaining chest x-ray to r/o pneumonia; hx of pulmonary disease on inhalers but has not been using lately. Await CXR, plan D/C if no change        Joseph Forbes  02/21/2014 8:29 AM

## 2014-02-21 NOTE — Discharge Summary (Signed)
Joseph Fears, MD   Joseph Borg, PA-C [address] [place], [place]  [address]                             [phone]  PATIENT ID: Joseph Forbes        MRN:  [id number]          DOB/AGE: 1940/10/17 / 73 y.o.    DISCHARGE SUMMARY  ADMISSION DATE:    02/19/2014 DISCHARGE DATE:   02/21/2014   ADMISSION DIAGNOSIS: LEFT HIP PRIMARY OSTEOARTHRITIS    DISCHARGE DIAGNOSIS:  LEFT HIP PRIMARY OSTEOARTHRITIS    ADDITIONAL DIAGNOSIS: Principal Problem:   Osteoarthritis of left hip Active Problems:   S/P total hip arthroplasty   Obstructive chronic bronchitis without exacerbation  Past Medical History  Diagnosis Date  . CAD (coronary artery disease)     Normal LV function .Status post stent placement to the right coronary artery in 2000 and 2001Obstructive coronary artery disease in November 2008 by catheterization   . Hyperlipidemia   . COPD (chronic obstructive pulmonary disease)   . Carotid artery disease   . Hypertension     pt denies, no meds  . GERD (gastroesophageal reflux disease)     PROCEDURE: Procedure(s): TOTAL HIP ARTHROPLASTY Left on 02/19/2014  CONSULTS: none     HISTORY: [name] is a very pleasant 73 year old white male who is seen today for evaluation of his left hip and groin pain. He has had this dating back to before September of this year in regards of pain and discomfort. He has had corticosteroid injection, which had been only temporarily beneficial. Only about 2-weeks of relief was noted. MRI scan was obtained of the left hip, which revealed left hip iliopsoas bursitis with small left hip effusion. Mild to moderate degenerative articular cartilage narrowing in the left hip with mild degenerative spurring of both femoral heads. There was some abnormal blunting and thickening anterior, superior, left acetabulum. Acetabular labrum, which favored degenerative tearing. He returned after the MRI scan and was felt that the majority of his pain has  been coming from his arthritis, however he does have 2 other problems; 1 the hypodensity in the prostate, as well as what appears to be some claudication lower extremities. He states that he has seen someone for the prostate and they felt it was not carcinoma. They also sent him off to be seen and evaluated for vascular studies and the arterial Doppler's were basically normal ABIs at rest with no significant occlusive vascular disease despite the mottling in his legs. He is a cigarette smoker though. He returns today for re-evaluation.   HOSPITAL COURSE:  Joseph Forbes is a 72 y.o. admitted on 02/19/2014 and found to have a diagnosis of LEFT HIP OSTEOARTHRITIS.  After appropriate laboratory studies were obtained  they were taken to the operating room on 02/19/2014 and underwent  Procedure(s): TOTAL HIP ARTHROPLASTY  Left.   They were given perioperative antibiotics:  Anti-infectives    Start     Dose/Rate Route Frequency Ordered Stop   02/19/14 2000  ceFAZolin (ANCEF) IVPB 2 g/50 mL premix     2 g100 mL/hr over 30 Minutes Intravenous Every 6 hours 02/19/14 1927 02/20/14 0304   02/19/14 0600  ceFAZolin (ANCEF) IVPB 2 g/50 mL premix     2 g100 mL/hr over 30 Minutes Intravenous On call to O.R. 02/18/14 1440 02/19/14 1155    .  Tolerated the procedure  well.  Placed with a foley intraoperatively.    Toradol was given post op.  POD #1, allowed out of bed to a chair.  PT for ambulation and exercise program.  Foley D/C'd in morning.  IV saline locked.  O2 discontionued.  POD #2, continued PT and ambulation. Some wheezing but had not had nebulizer.  Denied SOB.  CXR ordered  The remainder of the hospital course was dedicated to ambulation and strengthening.   The patient was discharged on 2 Days Post-Op in  Stable condition.  Blood products given:none  DIAGNOSTIC STUDIES: Recent vital signs:  Patient Vitals for the past 24 hrs:  BP Temp Temp src Pulse Resp SpO2  02/21/14 0800 - - - - 18 94 %   02/21/14 0607 (!) 109/49 mmHg 98.5 F (36.9 C) Oral 80 16 93 %  02/20/14 2021 (!) 122/55 mmHg 98.7 F (37.1 C) Oral 82 16 95 %  02/20/14 1603 - - - - - 93 %  02/20/14 1400 (!) 113/47 mmHg 98.6 F (37 C) - 74 18 98 %  02/20/14 1102 - - - - - (!) 80 %       Recent laboratory studies:  Recent Labs  02/20/14 0543 02/21/14 0500  WBC 12.0* 8.2  HGB 11.7* 9.9*  HCT 33.9* 28.9*  PLT 196 170    Recent Labs  02/20/14 0543 02/21/14 0500  NA 138 138  K 4.8 4.1  CL 102 102  CO2 25 25  BUN 15 14  CREATININE 0.97 0.90  GLUCOSE 127* 90  CALCIUM 8.6 8.6   Lab Results  Component Value Date   INR 1.03 02/11/2014   INR 1.0 07/04/2008     Recent Radiographic Studies :  Dg Chest 2 View  02/21/2014   CLINICAL DATA:  Wheezing on auscultation  EXAM: CHEST  2 VIEW  COMPARISON:  February 11, 2014  FINDINGS: The heart size and mediastinal contours are stable. Coronary artery stent identified. The lungs are hyperinflated. There is no focal infiltrate, pulmonary edema, or pleural effusion. The visualized skeletal structures are stable.  IMPRESSION: No active cardiopulmonary disease.  Emphysema.   Electronically Signed   By: Abelardo Diesel M.D.   On: 02/21/2014 10:10   Chest 2 View  02/11/2014   CLINICAL DATA:  Left total hip arthroplasty thumb up personal history of coronary artery disease and COPD, smoker  EXAM: CHEST  2 VIEW  COMPARISON:  None.  FINDINGS: Hyperinflation consistent with COPD. Heart size and vascular pattern are normal. The lungs are clear. There are no pleural effusions. There is mild bilateral apical scarring. Coronary stent identified.  IMPRESSION: COPD.  No acute findings.   Electronically Signed   By: Skipper Cliche M.D.   On: 02/11/2014 11:09   Dg Pelvis 1-2 Views  02/20/2014   CLINICAL DATA:  Postop films for left total hip replacement. Left hip pain.  EXAM: PELVIS - 1-2 VIEW  COMPARISON:  None.  FINDINGS: Postoperative changes with left total hip arthroplasty using non  cemented components. Components appear well seated. Skin clips consistent with recent surgery. Degenerative changes noted in the right hip. No acute bony abnormalities.  IMPRESSION: Left total hip arthroplasty.  Components appear well seated.   Electronically Signed   By: Lucienne Capers M.D.   On: 02/20/2014 00:07   Dg Hip Portable 1 View Left  02/19/2014   CLINICAL DATA:  Postop from left total hip replacement. Left hip pain.  EXAM: PORTABLE LEFT HIP - 1 VIEW  COMPARISON:  Pelvis 02/19/2014  FINDINGS: Lateral view left hip demonstrates a left hip arthroplasty. Components appear well seated on this view. Surgical clips consistent with recent surgery.  IMPRESSION: Left hip arthroplasty.   Electronically Signed   By: Lucienne Capers M.D.   On: 02/19/2014 23:58    DISCHARGE INSTRUCTIONS:     Discharge Instructions    Call MD / Call 911    Complete by:  As directed   If you experience chest pain or shortness of breath, CALL 911 and be transported to the hospital emergency room.  If you develop a fever above 101 F, pus (white drainage) or increased drainage or redness at the wound, or calf pain, call your surgeon's office.     Change dressing    Complete by:  As directed   DO NOT CHANGE DRESSING TILL SEEN IN OFFICE     Constipation Prevention    Complete by:  As directed   Drink plenty of fluids.  Prune juice and/or coffee may be helpful.  You may use a stool softener, such as Colace (over the counter) 100 mg twice a day.  Use MiraLax (over the counter) for constipation as needed but this may take several days to work.  Mag Citrate --OR-- Milk of Magnesia --OR -- Dulcolax pills/suppositories may also be used but follow directions on the label.     Diet general    Complete by:  As directed      Discharge instructions    Complete by:  As directed   YOU WERE GIVEN A DEVICE CALLED AN INCENTIVE SPIROMETER TO HELP YOU TAKE DEEP BREATHS.  PLEASE USE THIS AT LEAST TEN (10) TIMES EVERY 1-2 HOURS EVERY DAY  TO PREVENT PNEUMONIA.     Driving restrictions    Complete by:  As directed   No driving for 6 weeks     Follow the hip precautions as taught in Physical Therapy    Complete by:  As directed      Increase activity slowly as tolerated    Complete by:  As directed      Lifting restrictions    Complete by:  As directed   No lifting for 6 weeks     Partial weight bearing    Complete by:  As directed   50 % WEIGHT BEARING AS TAUGHT IN PHYSICAL THERAPY     Patient may shower    Complete by:  As directed   You may shower over the brown dressing.  Once the dressing is removed you may shower without a dressing once there is no drainage.  Do not wash over the wound.  If drainage remains, cover wound with plastic wrap and then shower.     TED hose    Complete by:  As directed   Use stockings (TED hose) for 1-2 weeks on operative leg(s).  You may remove them at night for sleeping. May stop the NON-operative leg stocking when you go home.     Weight bearing as tolerated    Complete by:  As directed            DISCHARGE MEDICATIONS:     Medication List    STOP taking these medications        aspirin EC 81 MG tablet      TAKE these medications        COMBIVENT 18-103 MCG/ACT inhaler  Generic drug:  albuterol-ipratropium  Inhale 2 puffs into the lungs every 6 (six) hours as needed for  wheezing.     famotidine 20 MG tablet  Commonly known as:  PEPCID  Take 20 mg by mouth 2 (two) times daily.     methocarbamol 500 MG tablet  Commonly known as:  ROBAXIN  Take 1 tablet (500 mg total) by mouth every 12 (twelve) hours as needed for muscle spasms.     nitroGLYCERIN 0.4 MG SL tablet  Commonly known as:  NITROSTAT  Place 0.4 mg under the tongue every 5 (five) minutes as needed for chest pain.     oxyCODONE 5 MG immediate release tablet  Commonly known as:  Oxy IR/ROXICODONE  Take 1-2 tablets (5-10 mg total) by mouth every 4 (four) hours as needed for moderate pain, severe pain or  breakthrough pain.     PROAIR HFA 108 (90 BASE) MCG/ACT inhaler  Generic drug:  albuterol  Inhale 1 puff into the lungs every 4 (four) hours as needed for wheezing.     rivaroxaban 10 MG Tabs tablet  Commonly known as:  XARELTO  Take 1 tablet (10 mg total) by mouth daily.     simvastatin 40 MG tablet  Commonly known as:  ZOCOR  Take 40 mg by mouth at bedtime.        FOLLOW UP VISIT:   Follow-up Information    Follow up with [facility], Vonna Kotyk, MD. Schedule an appointment as soon as possible for a visit on 03/04/2014.   Specialty:  Orthopedic Surgery   Contact information:   [address] [place] [address] [phone]       DISPOSITION:   Home  CONDITION:  Stable   Mike [address]. [place], [place] [phone]  02/21/2014 10:21 AM

## 2014-02-21 NOTE — Progress Notes (Signed)
Chest X ray results were negative, notified Biagio Borg PA. New order received to D/C Pt home. D/C instructions reviewed with the Pt and his son and scripts were given. Pt and son verbalized understanding of home care and Pt left with son at this time.

## 2014-02-21 NOTE — Progress Notes (Signed)
PT Cancellation Note  Patient Details Name: DESHON HSIAO MRN: [id number] DOB: 02-09-1941   Cancelled Treatment:    Reason Eval/Treat Not Completed: Patient at procedure or test/unavailable (x-ray).  PT will check back later this AM as time allows.   Thanks,    Barbarann Ehlers. Kresta Templeman, PT, DPT [phone]   02/21/2014, 9:49 AM

## 2014-02-21 NOTE — Progress Notes (Signed)
Physical Therapy Treatment Patient Details Name: Joseph Forbes MRN: [id number] DOB: Aug 14, 1940 Today's Date: 02/21/2014    History of Present Illness 73 y.o. male admitted to [facility] on 02/19/14 for elective left posterior THA.  He has significant PMHx of CAD, COPD (does not use O2 at home), HTN, R arm fx surgery, bil carotid enarterectomies, and cardiac stents.    PT Comments    Pt is progressing well with his mobility and exercises.  He will need further reinforcement of his hip precautions by his HHPT as he is planning on d/c this morning home with his wife's supervision.  Son is present and here to pick him up.    Follow Up Recommendations  Home health PT;Supervision for mobility/OOB     Equipment Recommendations  Rolling walker with 5" wheels    Recommendations for Other Services   NA     Precautions / Restrictions Precautions Precautions: Posterior Hip Precaution Comments: reviewed hip precautions and pt only able to get 1/3 this session.  Reinforced precautions.  Restrictions LLE Weight Bearing: Weight bearing as tolerated    Mobility  Bed Mobility Overal bed mobility: Modified Independent Bed Mobility: Supine to Sit     Supine to sit: Modified independent (Device/Increase time)     General bed mobility comments: Pt using hands to help progress left leg to EOB and extra time needed to complete the task  Transfers Overall transfer level: Needs assistance Equipment used: Rolling walker (2 wheeled) Transfers: Sit to/from Stand Sit to Stand: Supervision         General transfer comment: supervision for safety due to slow and shaky transitions.   Ambulation/Gait Ambulation/Gait assistance: Supervision Ambulation Distance (Feet): 120 Feet Assistive device: Rolling walker (2 wheeled) Gait Pattern/deviations: Step-through pattern;Antalgic Gait velocity: decreased Gait velocity interpretation: Below normal speed for age/gender General Gait Details: Pt continues to  have antalgic gait pattern.          Balance Overall balance assessment: Needs assistance Sitting-balance support: Feet supported;No upper extremity supported Sitting balance-Leahy Scale: Good     Standing balance support: Bilateral upper extremity supported;No upper extremity supported;Single extremity supported Standing balance-Leahy Scale: Fair                      Cognition Arousal/Alertness: Awake/alert Behavior During Therapy: WFL for tasks assessed/performed Overall Cognitive Status: Within Functional Limits for tasks assessed                      Exercises Total Joint Exercises Ankle Circles/Pumps: AROM;Both;20 reps;Seated Quad Sets: AROM;Left;10 reps;Seated Short Arc Quad: AROM;Left;10 reps;Seated Heel Slides: AROM;Left;10 reps;Seated Hip ABduction/ADduction: AAROM;Left;10 reps;Seated;Standing Long Arc Quad: AROM;Left;10 reps;Seated Knee Flexion: AROM;Left;10 reps;Standing Marching in Standing: AROM;Left;10 reps;Standing Standing Hip Extension: AROM;Left;10 reps;Standing        Pertinent Vitals/Pain Pain Assessment: 0-10 Pain Score: 3  Pain Location: left hip Pain Descriptors / Indicators: Aching;Burning Pain Intervention(s): Limited activity within patient's tolerance;Monitored during session;Repositioned           PT Goals (current goals can now be found in the care plan section) Acute Rehab PT Goals Patient Stated Goal: to go home today Progress towards PT goals: Progressing toward goals    Frequency  7X/week    PT Plan Current plan remains appropriate       End of Session   Activity Tolerance: Patient tolerated treatment well Patient left: in chair;with call bell/phone within reach;with family/visitor present     Time: 5400-8676 PT Time Calculation (  min): 21 min  Charges:  $Therapeutic Exercise: 8-22 mins                      Joseph Forbes, PT, DPT [phone]   02/21/2014, 12:20 PM

## 2014-08-01 ENCOUNTER — Ambulatory Visit (INDEPENDENT_AMBULATORY_CARE_PROVIDER_SITE_OTHER): Payer: Medicare Other | Admitting: Cardiology

## 2014-08-01 ENCOUNTER — Encounter: Payer: Self-pay | Admitting: Cardiology

## 2014-08-01 VITALS — BP 142/78 | HR 63 | Ht 75.0 in | Wt 179.4 lb

## 2014-08-01 DIAGNOSIS — I25118 Atherosclerotic heart disease of native coronary artery with other forms of angina pectoris: Secondary | ICD-10-CM | POA: Diagnosis not present

## 2014-08-01 DIAGNOSIS — I6523 Occlusion and stenosis of bilateral carotid arteries: Secondary | ICD-10-CM | POA: Diagnosis not present

## 2014-08-01 DIAGNOSIS — E785 Hyperlipidemia, unspecified: Secondary | ICD-10-CM

## 2014-08-01 DIAGNOSIS — I10 Essential (primary) hypertension: Secondary | ICD-10-CM

## 2014-08-01 MED ORDER — ATORVASTATIN CALCIUM 80 MG PO TABS: 80.0000 mg | ORAL_TABLET | Freq: Every day | ORAL | Status: DC

## 2014-08-01 NOTE — Progress Notes (Signed)
Clinical Summary Joseph Forbes is a 74 y.o.male seen today for follow up of the following medical problems.   1. CAD: prior stents in 2000 and 2001. Last cath 2008 w/ nonobstructive disease. Normal LVEF 65% by echo 2012. - no chest pain. Stable DOE at 1 to 1.5 blocks - compliant with meds   2. Carotid stenosis  - prior bilateral carotid endarterectomies, recent US shows good patency. Followed by [facility]  - denies any slurred speech, vision changes, focal neuro changes.   3. HL  - no recent panel in our system - compliant withs tatin  4. Tobacco abuse - not interested in quitting.   5. Hip osteoarthritis - s/p hip replacement in 02/2014 Past Medical History  Diagnosis Date  . CAD (coronary artery disease)     Normal LV function .Status post stent placement to the right coronary artery in 2000 and 2001Obstructive coronary artery disease in November 2008 by catheterization   . Hyperlipidemia   . COPD (chronic obstructive pulmonary disease)   . Carotid artery disease   . Hypertension     pt denies, no meds  . GERD (gastroesophageal reflux disease)      Allergies  Allergen Reactions  . Other Itching and Rash    Some medication OTC he took for headache caused a rash     Current Outpatient Prescriptions  Medication Sig Dispense Refill  . albuterol-ipratropium (COMBIVENT) 18-103 MCG/ACT inhaler Inhale 2 puffs into the lungs every 6 (six) hours as needed for wheezing.     . famotidine (PEPCID) 20 MG tablet Take 20 mg by mouth 2 (two) times daily.     . methocarbamol (ROBAXIN) 500 MG tablet Take 1 tablet (500 mg total) by mouth every 12 (twelve) hours as needed for muscle spasms. 30 tablet 0  . nitroGLYCERIN (NITROSTAT) 0.4 MG SL tablet Place 0.4 mg under the tongue every 5 (five) minutes as needed for chest pain.     Marland Kitchen oxyCODONE (OXY IR/ROXICODONE) 5 MG immediate release tablet Take 1-2 tablets (5-10 mg total) by mouth every 4 (four) hours as needed for  moderate pain, severe pain or breakthrough pain. 90 tablet 0  . PROAIR HFA 108 (90 BASE) MCG/ACT inhaler Inhale 1 puff into the lungs every 4 (four) hours as needed for wheezing.     . rivaroxaban (XARELTO) 10 MG TABS tablet Take 1 tablet (10 mg total) by mouth daily. 12 tablet 0  . simvastatin (ZOCOR) 40 MG tablet Take 40 mg by mouth at bedtime.       No current facility-administered medications for this visit.     Past Surgical History  Procedure Laterality Date  . Cardiac catheterization  02/2007  . Hydrocele excision Right 04/24/2013    Procedure: HYDROCELECTOMY ADULT;  Surgeon: Hanley Ben, MD;  Location: [facility];  Service: Urology;  Laterality: Right;  . Fracture surgery      right arm  . Carotid endarterectomy      bilateral    (here at [facility])  . Cardiac stents      2 stents placed   . Total hip arthroplasty Left 02/19/2014    Procedure: TOTAL HIP ARTHROPLASTY;  Surgeon: Garald Balding, MD;  Location: [facility];  Service: Orthopedics;  Laterality: Left;     Allergies  Allergen Reactions  . Other Itching and Rash    Some medication OTC he took for headache caused a rash      Family History  Problem Relation Age  of Onset  . Heart disease Mother     Before age 13  . Heart disease Father   . Heart attack Father      Social History Joseph Forbes reports that he has been smoking Cigarettes.  He has a 55 pack-year smoking history. He has never used smokeless tobacco. Joseph Forbes reports that he does not drink alcohol.   Review of Systems CONSTITUTIONAL: No weight loss, fever, chills, weakness or fatigue.  HEENT: Eyes: No visual loss, blurred vision, double vision or yellow sclerae.No hearing loss, sneezing, congestion, runny nose or sore throat.  SKIN: No rash or itching.  CARDIOVASCULAR:per HPI  RESPIRATORY: No cough or sputum.  GASTROINTESTINAL: No anorexia, nausea, vomiting or diarrhea. No abdominal pain or blood.  GENITOURINARY: No burning on  urination, no polyuria NEUROLOGICAL: No headache, dizziness, syncope, paralysis, ataxia, numbness or tingling in the extremities. No change in bowel or bladder control.  MUSCULOSKELETAL: No muscle, back pain, joint pain or stiffness.  LYMPHATICS: No enlarged nodes. No history of splenectomy.  PSYCHIATRIC: No history of depression or anxiety.  ENDOCRINOLOGIC: No reports of sweating, cold or heat intolerance. No polyuria or polydipsia.  Marland Kitchen   Physical Examination p 63 bp 142/78 Wt 179 lbs BMI 22 Gen: resting comfortably, no acute distress HEENT: no scleral icterus, pupils equal round and reactive, no palptable cervical adenopathy,  CV: RRR, no m/r/g, no JVD Resp: Clear to auscultation bilaterally GI: abdomen is soft, non-tender, non-distended, normal bowel sounds, no hepatosplenomegaly MSK: extremities are warm, no edema.  Skin: warm, no rash Neuro:  no focal deficits Psych: appropriate affect   Diagnostic Studies 12/12 DSE: Baseline LVEF 55-60%. Negative for ischemia.   07/2012 Carotids: patent bilaterally w/ minimal plaque/hyperplasia, vertebals normal bilateral   01/02/13 EKG: SR, normal axis, no ischemic changes    Assessment and Plan  1. CAD: no current symptoms. Continue current medications. Restart his ASA 81mg  daily.   2. Carotid stenosis: last US showed no significant stenosis, continue follow up w/ [facility]   3. HL: change to high dose statin in setting of known CAD, start lipitor 80mg  daily.   4. Tobacco - counseled on cessation and health benefits   F/u 1 year   Arnoldo Lenis, M.D.

## 2014-08-01 NOTE — Patient Instructions (Signed)
Your physician wants you to follow-up in: [place] DR[address] BRANCH You will receive a reminder letter in the mail two months in advance. If you don't receive a letter, please call our office to schedule the follow-up appointment.  Your physician has recommended you make the following change in your medication:   STOP TAKING SIMVASTATIN   START LIPITOR 80 MG DAILY  START ASPIRIN 81 MG DAILY  CONTINUE ALL OTHER MEDICATIONS AS DIRECTED  Thank you for choosing [facility]!!

## 2014-08-20 ENCOUNTER — Other Ambulatory Visit (HOSPITAL_COMMUNITY): Payer: Medicare Other

## 2014-08-20 ENCOUNTER — Ambulatory Visit: Payer: Medicare Other | Admitting: Family

## 2014-08-30 ENCOUNTER — Ambulatory Visit: Payer: Medicare Other | Admitting: Family

## 2014-08-30 ENCOUNTER — Ambulatory Visit (HOSPITAL_COMMUNITY)
Admission: RE | Admit: 2014-08-30 | Discharge: 2014-08-30 | Disposition: A | Discharge status: Home / Self Care | Payer: Medicare Other | Source: Ambulatory Visit | Attending: Vascular Surgery | Admitting: Vascular Surgery

## 2014-08-30 DIAGNOSIS — Z720 Tobacco use: Secondary | ICD-10-CM | POA: Insufficient documentation

## 2014-08-30 DIAGNOSIS — Z48812 Encounter for surgical aftercare following surgery on the circulatory system: Secondary | ICD-10-CM | POA: Diagnosis not present

## 2014-08-30 DIAGNOSIS — I6523 Occlusion and stenosis of bilateral carotid arteries: Secondary | ICD-10-CM | POA: Diagnosis not present

## 2014-09-02 ENCOUNTER — Encounter: Payer: Self-pay | Admitting: Family

## 2014-09-04 ENCOUNTER — Ambulatory Visit (INDEPENDENT_AMBULATORY_CARE_PROVIDER_SITE_OTHER): Payer: Medicare Other | Admitting: Family

## 2014-09-04 ENCOUNTER — Encounter: Payer: Self-pay | Admitting: Family

## 2014-09-04 VITALS — BP 120/70 | HR 71 | Resp 16 | Ht 75.0 in | Wt 177.0 lb

## 2014-09-04 DIAGNOSIS — I6522 Occlusion and stenosis of left carotid artery: Secondary | ICD-10-CM | POA: Diagnosis not present

## 2014-09-04 DIAGNOSIS — I6523 Occlusion and stenosis of bilateral carotid arteries: Secondary | ICD-10-CM | POA: Diagnosis not present

## 2014-09-04 DIAGNOSIS — Z720 Tobacco use: Secondary | ICD-10-CM

## 2014-09-04 DIAGNOSIS — Z48812 Encounter for surgical aftercare following surgery on the circulatory system: Secondary | ICD-10-CM

## 2014-09-04 DIAGNOSIS — Z9889 Other specified postprocedural states: Secondary | ICD-10-CM | POA: Diagnosis not present

## 2014-09-04 DIAGNOSIS — F17200 Nicotine dependence, unspecified, uncomplicated: Secondary | ICD-10-CM

## 2014-09-04 NOTE — Progress Notes (Signed)
Established Carotid Patient   History of Present Illness  Joseph Forbes is a 74 y.o. male patient of Dr. Kellie Simmering who is status post bilateral endarterectomies in 2010. He returns today for routine surveillance.  Patient has Negative history of TIA or stroke symptom. The patient denies amaurosis fugax or monocular blindness. The patient denies facial drooping. Pt. denies hemiplegia. The patient denies receptive or expressive aphasia.  Pt denies claudication symptoms in legs with walking. 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 Pt states his breathing is no worse lately than usual.  Pt enies New Medical or Surgical History.  Pt Diabetic: no Pt smoker: smoker (1 ppd , started at age 66 yrs)  Pt meds include: Statin : Yes ASA: Yes Other anticoagulants/antiplatelets: no  Past Medical History  Diagnosis Date  . CAD (coronary artery disease)     Normal LV function .Status post stent placement to the right coronary artery in 2000 and 2001Obstructive coronary artery disease in November 2008 by catheterization   . Hyperlipidemia   . COPD (chronic obstructive pulmonary disease)   . Carotid artery disease   . Hypertension     pt denies, no meds  . GERD (gastroesophageal reflux disease)     Social History History  Substance Use Topics  . Smoking status: Current Every Day Smoker -- 1.00 packs/day for 55 years    Types: Cigarettes  . Smokeless tobacco: Never Used  . Alcohol Use: No    Family History Family History  Problem Relation Age of Onset  . Heart disease Mother     Before age 39  . Heart disease Father   . Heart attack Father     Surgical History Past Surgical History  Procedure Laterality Date  . Cardiac catheterization  02/2007  . Hydrocele excision Right 04/24/2013    Procedure: HYDROCELECTOMY ADULT;  Surgeon: Hanley Ben, MD;  Location: [facility] [place];  Service: Urology;  Laterality: Right;  . Fracture surgery      right  arm  . Cardiac stents      2 stents placed   . 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 Joint replacement Left Nov. 3, 2015    Hip  . Carotid endarterectomy Bilateral 2010       (here at [facility])  CE    Allergies  Allergen Reactions  . Other Itching and Rash    Some medication OTC he took for headache caused a rash    Current Outpatient Prescriptions  Medication Sig Dispense Refill  . aspirin 81 MG tablet Take 81 mg by mouth daily.    Marland Kitchen atorvastatin (LIPITOR) 80 MG tablet Take 1 tablet (80 mg total) by mouth daily. 90 tablet 3  . famotidine (PEPCID) 20 MG tablet 20 mg as needed.     . nitroGLYCERIN (NITROSTAT) 0.4 MG SL tablet Place 0.4 mg under the tongue every 5 (five) minutes as needed for chest pain.     Marland Kitchen PROAIR HFA 108 (90 BASE) MCG/ACT inhaler Inhale 1 puff into the lungs every 4 (four) hours as needed for wheezing.      No current facility-administered medications for this visit.    Review of Systems : See HPI for pertinent positives and negatives.  Physical Examination  Filed Vitals:   09/04/14 0933 09/04/14 0936  BP: 118/69 120/70  Pulse: 69 71  Resp:  16  Height:  6\' 3"  (1.905 m)  Weight:  177 lb (80.287 kg)  SpO2:  99%   Body mass index is 22.12 kg/(m^2).  General: WDWN male in NAD GAIT: normal Eyes: PERRLA Pulmonary: Non-labored, Positive Rales, Positive rhonchi, & Positive wheezing, chronic moist cough.  Cardiac: regular Rhythm , Negative detected murmur.  VASCULAR EXAM Carotid Bruits Left Right   Negative Negative   Aorta is not palpable. Radial pulses are 2+ palpable and equal.     Gastrointestinal: soft, nontender, BS WNL, no r/g, negative masses.  Musculoskeletal: Negative  muscle atrophy/wasting. M/S 5/5 throughout, Extremities without ischemic changes.  Neurologic: A&O X 3; Appropriate Affect ; SENSATION ;normal;  Speech is normal CN 2-12 intact except some hearing loss, Pain and light touch intact in extremities, Motor exam as listed above.         Non-Invasive Vascular Imaging CAROTID DUPLEX 09/04/2014  CEREBROVASCULAR DUPLEX EVALUATION    INDICATION: Follow-up carotid disease     PREVIOUS INTERVENTION(S): Right carotid endarterectomy 07/05/2008 Left carotid endarterectomy 01/08/2002    DUPLEX EXAM:     RIGHT  LEFT  Peak Systolic Velocities (cm/s) End Diastolic Velocities (cm/s) Plaque LOCATION Peak Systolic Velocities (cm/s) End Diastolic Velocities (cm/s) Plaque  98 19  CCA PROXIMAL 124 24   66 16  CCA MID 109 25 HM  75 15 HM CCA DISTAL 102 21 HM  152 18 HM ECA 112 11   67 14  ICA PROXIMAL 105 22 HT  86 25  ICA MID 89 27   102 26  ICA DISTAL 88 26     NA ICA / CCA Ratio (PSV) NA  Antegrade  Vertebral Flow Antegrade    Brachial Systolic Pressure (mmHg)   Within normal limits  Brachial Artery Waveforms Within normal limits     Plaque Morphology:  HM = Homogeneous, HT = Heterogeneous, CP = Calcific Plaque, SP = Smooth Plaque, IP = Irregular Plaque  ADDITIONAL FINDINGS:     IMPRESSION: 1. Widely patent right carotid endarterectomy without evidence of restenosis or hyperplasia.  2. Patent left carotid endarterectomy with minimal (1%-49%) plaque in the proximal internal carotid artery. 3. Bilateral vertebral artery is antegrade.    Compared to the previous exam:  No significant change compared to prior exam.       Assessment: Joseph Forbes is a 74 y.o. male who status post bilateral endarterectomies in 2010.  He has never had any stroke or TIA activity. His atherosclerotic risk factors include smoking, dyslipidemia, and CAD, all under medical management. Today's carotid Duplex suggests widely patent right carotid endarterectomy  without evidence of restenosis or hyperplasia.  Patent left carotid endarterectomy with minimal (1%-49%) plaque in the proximal internal carotid artery. No significant change compared to prior exam.     Plan:  The patient was counseled re smoking cessation and given several free resources re smoking cessation.  Follow-up in 1 year with Carotid Duplex.   I discussed in depth with the patient the nature of atherosclerosis, and emphasized the importance of maximal medical management including strict control of blood pressure, blood glucose, and lipid levels, obtaining regular exercise, and cessation of smoking.  The patient is aware that without maximal medical management the underlying atherosclerotic disease process will progress, limiting the benefit of any interventions. The patient was given information about stroke prevention and what symptoms should prompt the patient to seek immediate medical care. Thank you for allowing [place] to participate in this patient's care.  Vinnie Level  Nickel, RN, MSN, FNP-C [facility] Office: [phone]  Clinic Physician: Scot Dock  09/04/2014 9:57 AM

## 2014-09-04 NOTE — Patient Instructions (Signed)
Stroke Prevention Some medical conditions and behaviors are associated with an increased chance of having a stroke. You may prevent a stroke by making healthy choices and managing medical conditions. HOW CAN I REDUCE MY RISK OF HAVING A STROKE?   Stay physically active. Get at least 30 minutes of activity on most or all days.  Do not smoke. It may also be helpful to avoid exposure to secondhand smoke.  Limit alcohol use. Moderate alcohol use is considered to be:  No more than 2 drinks per day for men.  No more than 1 drink per day for nonpregnant women.  Eat healthy foods. This involves:  Eating 5 or more servings of fruits and vegetables a day.  Making dietary changes that address high blood pressure (hypertension), high cholesterol, diabetes, or obesity.  Manage your cholesterol levels.  Making food choices that are high in fiber and low in saturated fat, trans fat, and cholesterol may control cholesterol levels.  Take any prescribed medicines to control cholesterol as directed by your health care provider.  Manage your diabetes.  Controlling your carbohydrate and sugar intake is recommended to manage diabetes.  Take any prescribed medicines to control diabetes as directed by your health care provider.  Control your hypertension.  Making food choices that are low in salt (sodium), saturated fat, trans fat, and cholesterol is recommended to manage hypertension.  Take any prescribed medicines to control hypertension as directed by your health care provider.  Maintain a healthy weight.  Reducing calorie intake and making food choices that are low in sodium, saturated fat, trans fat, and cholesterol are recommended to manage weight.  Stop drug abuse.  Avoid taking birth control pills.  Talk to your health care provider about the risks of taking birth control pills if you are over 35 years old, smoke, get migraines, or have ever had a blood clot.  Get evaluated for sleep  disorders (sleep apnea).  Talk to your health care provider about getting a sleep evaluation if you snore a lot or have excessive sleepiness.  Take medicines only as directed by your health care provider.  For some people, aspirin or blood thinners (anticoagulants) are helpful in reducing the risk of forming abnormal blood clots that can lead to stroke. If you have the irregular heart rhythm of atrial fibrillation, you should be on a blood thinner unless there is a good reason you cannot take them.  Understand all your medicine instructions.  Make sure that other conditions (such as anemia or atherosclerosis) are addressed. SEEK IMMEDIATE MEDICAL CARE IF:   You have sudden weakness or numbness of the face, arm, or leg, especially on one side of the body.  Your face or eyelid droops to one side.  You have sudden confusion.  You have trouble speaking (aphasia) or understanding.  You have sudden trouble seeing in one or both eyes.  You have sudden trouble walking.  You have dizziness.  You have a loss of balance or coordination.  You have a sudden, severe headache with no known cause.  You have new chest pain or an irregular heartbeat. Any of these symptoms may represent a serious problem that is an emergency. Do not wait to see if the symptoms will go away. Get medical help at once. Call your local emergency services (911 in [place] Do not drive yourself to the hospital. Document Released: 05/13/2004 Document Revised: 08/20/2013 Document Reviewed: 10/06/2012 ExitCare Patient Information 2015 [employer]. This information is not intended to replace advice given   to you by your health care provider. Make sure you discuss any questions you have with your health care provider.    Smoking Cessation Quitting smoking is important to your health and has many advantages. However, it is not always easy to quit since nicotine is a very addictive drug. Oftentimes, people try 3 times or  more before being able to quit. This document explains the best ways for you to prepare to quit smoking. Quitting takes hard work and a lot of effort, but you can do it. ADVANTAGES OF QUITTING SMOKING  You will live longer, feel better, and live better.  Your body will feel the impact of quitting smoking almost immediately.  Within 20 minutes, blood pressure decreases. Your pulse returns to its normal level.  After 8 hours, carbon monoxide levels in the blood return to normal. Your oxygen level increases.  After 24 hours, the chance of having a heart attack starts to decrease. Your breath, hair, and body stop smelling like smoke.  After 48 hours, damaged nerve endings begin to recover. Your sense of taste and smell improve.  After 72 hours, the body is virtually free of nicotine. Your bronchial tubes relax and breathing becomes easier.  After 2 to 12 weeks, lungs can hold more air. Exercise becomes easier and circulation improves.  The risk of having a heart attack, stroke, cancer, or lung disease is greatly reduced.  After 1 year, the risk of coronary heart disease is cut in half.  After 5 years, the risk of stroke falls to the same as a nonsmoker.  After 10 years, the risk of lung cancer is cut in half and the risk of other cancers decreases significantly.  After 15 years, the risk of coronary heart disease drops, usually to the level of a nonsmoker.  If you are pregnant, quitting smoking will improve your chances of having a healthy baby.  The people you live with, especially any children, will be healthier.  You will have extra money to spend on things other than cigarettes. QUESTIONS TO THINK ABOUT BEFORE ATTEMPTING TO QUIT You may want to talk about your answers with your health care provider.  Why do you want to quit?  If you tried to quit in the past, what helped and what did not?  What will be the most difficult situations for you after you quit? How will you plan to  handle them?  Who can help you through the tough times? Your family? Friends? A health care provider?  What pleasures do you get from smoking? What ways can you still get pleasure if you quit? Here are some questions to ask your health care provider:  How can you help me to be successful at quitting?  What medicine do you think would be best for me and how should I take it?  What should I do if I need more help?  What is smoking withdrawal like? How can I get information on withdrawal? GET READY  Set a quit date.  Change your environment by getting rid of all cigarettes, ashtrays, matches, and lighters in your home, car, or work. Do not let people smoke in your home.  Review your past attempts to quit. Think about what worked and what did not. GET SUPPORT AND ENCOURAGEMENT You have a better chance of being successful if you have help. You can get support in many ways.  Tell your family, friends, and coworkers that you are going to quit and need their support. Ask them   not to smoke around you.  Get individual, group, or telephone counseling and support. Programs are available at local hospitals and health centers. Call your local health department for information about programs in your area.  Spiritual beliefs and practices may help some smokers quit.  Download a "quit meter" on your computer to keep track of quit statistics, such as how long you have gone without smoking, cigarettes not smoked, and money saved.  Get a self-help book about quitting smoking and staying off tobacco. LEARN NEW SKILLS AND BEHAVIORS  Distract yourself from urges to smoke. Talk to someone, go for a walk, or occupy your time with a task.  Change your normal routine. Take a different route to work. Drink tea instead of coffee. Eat breakfast in a different place.  Reduce your stress. Take a hot bath, exercise, or read a book.  Plan something enjoyable to do every day. Reward yourself for not  smoking.  Explore interactive web-based programs that specialize in helping you quit. GET MEDICINE AND USE IT CORRECTLY Medicines can help you stop smoking and decrease the urge to smoke. Combining medicine with the above behavioral methods and support can greatly increase your chances of successfully quitting smoking.  Nicotine replacement therapy helps deliver nicotine to your body without the negative effects and risks of smoking. Nicotine replacement therapy includes nicotine gum, lozenges, inhalers, nasal sprays, and skin patches. Some may be available over-the-counter and others require a prescription.  Antidepressant medicine helps people abstain from smoking, but how this works is unknown. This medicine is available by prescription.  Nicotinic receptor partial agonist medicine simulates the effect of nicotine in your brain. This medicine is available by prescription. Ask your health care provider for advice about which medicines to use and how to use them based on your health history. Your health care provider will tell you what side effects to look out for if you choose to be on a medicine or therapy. Carefully read the information on the package. Do not use any other product containing nicotine while using a nicotine replacement product.  RELAPSE OR DIFFICULT SITUATIONS Most relapses occur within the first 3 months after quitting. Do not be discouraged if you start smoking again. Remember, most people try several times before finally quitting. You may have symptoms of withdrawal because your body is used to nicotine. You may crave cigarettes, be irritable, feel very hungry, cough often, get headaches, or have difficulty concentrating. The withdrawal symptoms are only temporary. They are strongest when you first quit, but they will go away within 10-14 days. To reduce the chances of relapse, try to:  Avoid drinking alcohol. Drinking lowers your chances of successfully quitting.  Reduce the  amount of caffeine you consume. Once you quit smoking, the amount of caffeine in your body increases and can give you symptoms, such as a rapid heartbeat, sweating, and anxiety.  Avoid smokers because they can make you want to smoke.  Do not let weight gain distract you. Many smokers will gain weight when they quit, usually less than 10 pounds. Eat a healthy diet and stay active. You can always lose the weight gained after you quit.  Find ways to improve your mood other than smoking. FOR MORE INFORMATION  www.smokefree.gov  Document Released: 03/30/2001 Document Revised: 08/20/2013 Document Reviewed: 07/15/2011 ExitCare Patient Information 2015 [employer]. This information is not intended to replace advice given to you by your health care provider. Make sure you discuss any questions you have with your health   care provider.    Smoking Cessation, Tips for Success If you are ready to quit smoking, congratulations! You have chosen to help yourself be healthier. Cigarettes bring nicotine, tar, carbon monoxide, and other irritants into your body. Your lungs, heart, and blood vessels will be able to work better without these poisons. There are many different ways to quit smoking. Nicotine gum, nicotine patches, a nicotine inhaler, or nicotine nasal spray can help with physical craving. Hypnosis, support groups, and medicines help break the habit of smoking. WHAT THINGS CAN I DO TO MAKE QUITTING EASIER?  Here are some tips to help you quit for good:  Pick a date when you will quit smoking completely. Tell all of your friends and family about your plan to quit on that date.  Do not try to slowly cut down on the number of cigarettes you are smoking. Pick a quit date and quit smoking completely starting on that day.  Throw away all cigarettes.   Clean and remove all ashtrays from your home, work, and car.  On a card, write down your reasons for quitting. Carry the card with you and read it  when you get the urge to smoke.  Cleanse your body of nicotine. Drink enough water and fluids to keep your urine clear or pale yellow. Do this after quitting to flush the nicotine from your body.  Learn to predict your moods. Do not let a bad situation be your excuse to have a cigarette. Some situations in your life might tempt you into wanting a cigarette.  Never have "just one" cigarette. It leads to wanting another and another. Remind yourself of your decision to quit.  Change habits associated with smoking. If you smoked while driving or when feeling stressed, try other activities to replace smoking. Stand up when drinking your coffee. Brush your teeth after eating. Sit in a different chair when you read the paper. Avoid alcohol while trying to quit, and try to drink fewer caffeinated beverages. Alcohol and caffeine may urge you to smoke.  Avoid foods and drinks that can trigger a desire to smoke, such as sugary or spicy foods and alcohol.  Ask people who smoke not to smoke around you.  Have something planned to do right after eating or having a cup of coffee. For example, plan to take a walk or exercise.  Try a relaxation exercise to calm you down and decrease your stress. Remember, you may be tense and nervous for the first 2 weeks after you quit, but this will pass.  Find new activities to keep your hands busy. Play with a pen, coin, or rubber band. Doodle or draw things on paper.  Brush your teeth right after eating. This will help cut down on the craving for the taste of tobacco after meals. You can also try mouthwash.   Use oral substitutes in place of cigarettes. Try using lemon drops, carrots, cinnamon sticks, or chewing gum. Keep them handy so they are available when you have the urge to smoke.  When you have the urge to smoke, try deep breathing.  Designate your home as a nonsmoking area.  If you are a heavy smoker, ask your health care provider about a prescription for  nicotine chewing gum. It can ease your withdrawal from nicotine.  Reward yourself. Set aside the cigarette money you save and buy yourself something nice.  Look for support from others. Join a support group or smoking cessation program. Ask someone at home or at work   to help you with your plan to quit smoking.  Always ask yourself, "Do I need this cigarette or is this just a reflex?" Tell yourself, "Today, I choose not to smoke," or "I do not want to smoke." You are reminding yourself of your decision to quit.  Do not replace cigarette smoking with electronic cigarettes (commonly called e-cigarettes). The safety of e-cigarettes is unknown, and some may contain harmful chemicals.  If you relapse, do not give up! Plan ahead and think about what you will do the next time you get the urge to smoke. HOW WILL I FEEL WHEN I QUIT SMOKING? You may have symptoms of withdrawal because your body is used to nicotine (the addictive substance in cigarettes). You may crave cigarettes, be irritable, feel very hungry, cough often, get headaches, or have difficulty concentrating. The withdrawal symptoms are only temporary. They are strongest when you first quit but will go away within 10-14 days. When withdrawal symptoms occur, stay in control. Think about your reasons for quitting. Remind yourself that these are signs that your body is healing and getting used to being without cigarettes. Remember that withdrawal symptoms are easier to treat than the major diseases that smoking can cause.  Even after the withdrawal is over, expect periodic urges to smoke. However, these cravings are generally short lived and will go away whether you smoke or not. Do not smoke! WHAT RESOURCES ARE AVAILABLE TO HELP ME QUIT SMOKING? Your health care provider can direct you to community resources or hospitals for support, which may include:  Group support.  Education.  Hypnosis.  Therapy. Document Released: 01/02/2004 Document  Revised: 08/20/2013 Document Reviewed: 09/21/2012 ExitCare Patient Information 2015 [employer]. This information is not intended to replace advice given to you by your health care provider. Make sure you discuss any questions you have with your health care provider.  

## 2014-11-13 ENCOUNTER — Other Ambulatory Visit (HOSPITAL_COMMUNITY): Payer: Self-pay | Admitting: Interventional Radiology

## 2014-11-13 DIAGNOSIS — M549 Dorsalgia, unspecified: Secondary | ICD-10-CM

## 2014-11-13 DIAGNOSIS — IMO0002 Reserved for concepts with insufficient information to code with codable children: Secondary | ICD-10-CM

## 2014-11-14 ENCOUNTER — Other Ambulatory Visit: Payer: Self-pay | Admitting: Radiology

## 2014-11-15 ENCOUNTER — Ambulatory Visit (HOSPITAL_COMMUNITY)
Admission: RE | Admit: 2014-11-15 | Discharge: 2014-11-15 | Disposition: A | Discharge status: Home / Self Care | Payer: Medicare Other | Source: Ambulatory Visit | Attending: Interventional Radiology | Admitting: Interventional Radiology

## 2014-11-15 ENCOUNTER — Encounter (HOSPITAL_COMMUNITY): Payer: Self-pay

## 2014-11-15 DIAGNOSIS — E785 Hyperlipidemia, unspecified: Secondary | ICD-10-CM | POA: Insufficient documentation

## 2014-11-15 DIAGNOSIS — K219 Gastro-esophageal reflux disease without esophagitis: Secondary | ICD-10-CM | POA: Diagnosis not present

## 2014-11-15 DIAGNOSIS — M4854XA Collapsed vertebra, not elsewhere classified, thoracic region, initial encounter for fracture: Secondary | ICD-10-CM | POA: Diagnosis present

## 2014-11-15 DIAGNOSIS — Z96642 Presence of left artificial hip joint: Secondary | ICD-10-CM | POA: Diagnosis not present

## 2014-11-15 DIAGNOSIS — I2510 Atherosclerotic heart disease of native coronary artery without angina pectoris: Secondary | ICD-10-CM | POA: Insufficient documentation

## 2014-11-15 DIAGNOSIS — IMO0002 Reserved for concepts with insufficient information to code with codable children: Secondary | ICD-10-CM | POA: Insufficient documentation

## 2014-11-15 DIAGNOSIS — Z7982 Long term (current) use of aspirin: Secondary | ICD-10-CM | POA: Insufficient documentation

## 2014-11-15 DIAGNOSIS — I10 Essential (primary) hypertension: Secondary | ICD-10-CM | POA: Insufficient documentation

## 2014-11-15 DIAGNOSIS — F17210 Nicotine dependence, cigarettes, uncomplicated: Secondary | ICD-10-CM | POA: Insufficient documentation

## 2014-11-15 DIAGNOSIS — M549 Dorsalgia, unspecified: Secondary | ICD-10-CM

## 2014-11-15 DIAGNOSIS — J449 Chronic obstructive pulmonary disease, unspecified: Secondary | ICD-10-CM | POA: Insufficient documentation

## 2014-11-15 LAB — CBC WITH DIFFERENTIAL/PLATELET
Basophils Absolute: 0 10*3/uL (ref 0.0–0.1)
Basophils Relative: 0 % (ref 0–1)
Eosinophils Absolute: 0.1 10*3/uL (ref 0.0–0.7)
Eosinophils Relative: 1 % (ref 0–5)
HCT: 42.3 % (ref 39.0–52.0)
Hemoglobin: 14.8 g/dL (ref 13.0–17.0)
Lymphocytes Relative: 21 % (ref 12–46)
Lymphs Abs: 1.5 10*3/uL (ref 0.7–4.0)
MCH: 32.2 pg (ref 26.0–34.0)
MCHC: 35 g/dL (ref 30.0–36.0)
MCV: 92 fL (ref 78.0–100.0)
Monocytes Absolute: 0.6 10*3/uL (ref 0.1–1.0)
Monocytes Relative: 9 % (ref 3–12)
Neutro Abs: 5.1 10*3/uL (ref 1.7–7.7)
Neutrophils Relative %: 69 % (ref 43–77)
Platelets: 206 10*3/uL (ref 150–400)
RBC: 4.6 MIL/uL (ref 4.22–5.81)
RDW: 12 % (ref 11.5–15.5)
WBC: 7.3 10*3/uL (ref 4.0–10.5)

## 2014-11-15 LAB — BASIC METABOLIC PANEL
Anion gap: 10 (ref 5–15)
BUN: 13 mg/dL (ref 6–20)
CHLORIDE: 105 mmol/L (ref 101–111)
CO2: 25 mmol/L (ref 22–32)
Calcium: 9.2 mg/dL (ref 8.9–10.3)
Creatinine, Ser: 1.03 mg/dL (ref 0.61–1.24)
GFR calc non Af Amer: >60 mL/min (ref >60)
GLUCOSE: 88 mg/dL (ref 65–99)
POTASSIUM: 4.3 mmol/L (ref 3.5–5.1)
Sodium: 140 mmol/L (ref 135–145)

## 2014-11-15 LAB — PROTIME-INR
INR: 1.1 (ref 0.00–1.49)
PROTHROMBIN TIME: 14.4 s (ref 11.6–15.2)

## 2014-11-15 MED ORDER — CEFAZOLIN SODIUM-DEXTROSE 2-3 GM-% IV SOLR
2.0000 g | Freq: Once | INTRAVENOUS | Status: AC
  Administered 2014-11-15: 2 g via INTRAVENOUS

## 2014-11-15 MED ORDER — BUPIVACAINE HCL (PF) 0.25 % IJ SOLN
INTRAMUSCULAR | Status: AC
Start: 2014-11-15 — End: 2014-11-15
  Filled 2014-11-15: qty 30

## 2014-11-15 MED ORDER — FENTANYL CITRATE (PF) 100 MCG/2ML IJ SOLN
INTRAMUSCULAR | Status: AC
  Filled 2014-11-15: qty 2

## 2014-11-15 MED ORDER — MIDAZOLAM HCL 2 MG/2ML IJ SOLN
INTRAMUSCULAR | Status: AC | PRN
  Administered 2014-11-15 (×3): 1 mg via INTRAVENOUS

## 2014-11-15 MED ORDER — MIDAZOLAM HCL 2 MG/2ML IJ SOLN
INTRAMUSCULAR | Status: AC
  Filled 2014-11-15: qty 2

## 2014-11-15 MED ORDER — CEFAZOLIN SODIUM-DEXTROSE 2-3 GM-% IV SOLR
INTRAVENOUS | Status: AC
  Filled 2014-11-15: qty 50

## 2014-11-15 MED ORDER — HYDROMORPHONE HCL 1 MG/ML IJ SOLN
INTRAMUSCULAR | Status: AC | PRN
  Administered 2014-11-15: 1 mg via INTRAVENOUS

## 2014-11-15 MED ORDER — FENTANYL CITRATE (PF) 100 MCG/2ML IJ SOLN
INTRAMUSCULAR | Status: AC | PRN
  Administered 2014-11-15 (×5): 25 ug via INTRAVENOUS

## 2014-11-15 MED ORDER — HYDROMORPHONE HCL 1 MG/ML IJ SOLN
INTRAMUSCULAR | Status: AC
  Filled 2014-11-15: qty 1

## 2014-11-15 MED ORDER — TOBRAMYCIN SULFATE 1.2 G IJ SOLR
INTRAMUSCULAR | Status: AC
Start: 2014-11-15 — End: 2014-11-15
  Filled 2014-11-15: qty 1.2

## 2014-11-15 MED ORDER — IOHEXOL 300 MG/ML  SOLN
50.0000 mL | Freq: Once | INTRAMUSCULAR | Status: AC | PRN
  Administered 2014-11-15: 1 mL via INTRAVENOUS

## 2014-11-15 MED ORDER — SODIUM CHLORIDE 0.9 % IV SOLN: INTRAVENOUS | Status: AC

## 2014-11-15 MED ORDER — SODIUM CHLORIDE 0.9 % IV SOLN
INTRAVENOUS | Status: DC
  Administered 2014-11-15: 10:00:00 via INTRAVENOUS

## 2014-11-15 NOTE — Sedation Documentation (Signed)
Pt. Having intermittent pain from KP procedure as MD progresses. Attempting to make the pt. More comfortable.

## 2014-11-15 NOTE — H&P (Signed)
Chief Complaint: Patient was seen in consultation today for K12 kyphoplasty/vertebroplasty at the request of [name]  Referring Physician(s): [name]  History of Present Illness: Joseph Forbes is a 74 y.o. male who reports severe back pain in the "middle of his back" for "a few weeks" but has worsened over the past 2 weeks.  He states his pain is about 7/10 and it is constant.  He denies fall or injury.  Imaging reveals T12 vertebral compression fracture.  He is a current smoker and other PMH include CAD, COPD, and HTN   Past Medical History  Diagnosis Date  . CAD (coronary artery disease)     Normal LV function .Status post stent placement to the right coronary artery in 2000 and 2001Obstructive coronary artery disease in November 2008 by catheterization   . Hyperlipidemia   . COPD (chronic obstructive pulmonary disease)   . Carotid artery disease   . Hypertension     pt denies, no meds  . GERD (gastroesophageal reflux disease)     Past Surgical History  Procedure Laterality Date  . Cardiac catheterization  02/2007  . Hydrocele excision Right 04/24/2013    Procedure: HYDROCELECTOMY ADULT;  Surgeon: Hanley Ben, MD;  Location: [facility];  Service: Urology;  Laterality: Right;  . Fracture surgery      right arm  . Cardiac stents      2 stents placed   . Total hip arthroplasty Left 02/19/2014    Procedure: TOTAL HIP ARTHROPLASTY;  Surgeon: Garald Balding, MD;  Location: [place];  Service: Orthopedics;  Laterality: Left;  . Joint replacement Left Nov. 3, 2015    Hip  . Carotid endarterectomy Bilateral 2010       ([facility] at [facility])  CE    Allergies: Other  Medications: Prior to Admission medications   Medication Sig Start Date End Date Taking? Authorizing Provider  atorvastatin (LIPITOR) 80 MG tablet Take 1 tablet (80 mg total) by mouth daily. 08/01/14  Yes Arnoldo Lenis, MD  PROAIR HFA 108 [phone] BASE) MCG/ACT inhaler Inhale 2  puffs into the lungs every 4 (four) hours as needed for wheezing.  07/31/13  Yes Historical Provider, MD  aspirin 81 MG tablet Take 81 mg by mouth daily.    Historical Provider, MD  famotidine (PEPCID) 20 MG tablet Take 20 mg by mouth as needed for heartburn or indigestion.  08/19/14   Historical Provider, MD  nitroGLYCERIN (NITROSTAT) 0.4 MG SL tablet Place 0.4 mg under the tongue every 5 (five) minutes as needed for chest pain.     Historical Provider, MD     Family History  Problem Relation Age of Onset  . Heart disease Mother     Before age 69  . Heart disease Father   . Heart attack Father     History   Social History  . Marital Status: Married    Spouse Name: N/A  . Number of Children: N/A  . Years of Education: N/A   Social History Main Topics  . Smoking status: Current Every Day Smoker -- 1.00 packs/day for 55 years    Types: Cigarettes  . Smokeless tobacco: Never Used  . Alcohol Use: No  . Drug Use: No  . Sexual Activity: Not on file   Other Topics Concern  . None   Social History Narrative    ECOG Status: 1 - Symptomatic but completely ambulatory  Review of Systems: A 12 point ROS discussed and  pertinent positives are indicated in the HPI above.  All other systems are negative.  Review of Systems  Constitutional: Positive for activity change. Negative for fever, chills, appetite change and fatigue.  Respiratory: Positive for cough. Negative for choking, chest tightness and shortness of breath.   Cardiovascular: Negative for chest pain.  Gastrointestinal: Negative for nausea, vomiting and abdominal pain.  Musculoskeletal: Positive for back pain.  Skin: Negative.   Neurological: Negative.   Psychiatric/Behavioral: Negative.     Vital Signs: BP 145/68 mmHg  Pulse 57  Temp(Src) 97.4 F (36.3 C) (Oral)  Resp 14  Ht 6\' 3"  (1.905 m)  Wt 183 lb (83.008 kg)  BMI 22.87 kg/m2  SpO2 100%  Physical Exam  Constitutional: He is oriented to person, place, and  time. He appears well-developed and well-nourished.  HENT:  Head: Normocephalic and atraumatic.  Eyes: EOM are normal.  Neck: Normal range of motion. Neck supple.  Cardiovascular: Normal rate, regular rhythm and normal heart sounds.   No murmur heard. Pulmonary/Chest: Effort normal and breath sounds normal. He has no wheezes.  Abdominal: Soft. Bowel sounds are normal. He exhibits no distension. There is no tenderness.  Musculoskeletal: Normal range of motion.  Neurological: He is alert and oriented to person, place, and time.  Skin: Skin is warm and dry.  Psychiatric: He has a normal mood and affect. His behavior is normal. Judgment and thought content normal.  Vitals reviewed.   Mallampati Score:  MD Evaluation Airway: WNL Heart: WNL Abdomen: WNL Chest/ Lungs: WNL ASA  Classification: 2 Mallampati/Airway Score: Two  Imaging: No results found.  Labs:  CBC:  Recent Labs  02/11/14 1038 02/20/14 0543 02/21/14 0500  WBC 6.0 12.0* 8.2  HGB 16.4 11.7* 9.9*  HCT 47.0 33.9* 28.9*  PLT 233 196 170    COAGS:  Recent Labs  02/11/14 1038  INR 1.03  APTT 27    BMP:  Recent Labs  02/11/14 1038 02/20/14 0543 02/21/14 0500  NA 139 138 138  K 4.6 4.8 4.1  CL 100 102 102  CO2 25 25 25   GLUCOSE 82 127* 90  BUN 11 15 14   CALCIUM 10.0 8.6 8.6  CREATININE 0.99 0.97 0.90  GFRNONAA 80* 81* 83*  GFRAA >90 >90 >90    LIVER FUNCTION TESTS:  Recent Labs  02/11/14 1038  BILITOT 0.3  AST 22  ALT 12  ALKPHOS 98  PROT 7.5  ALBUMIN 3.9    TUMOR MARKERS: No results for input(s): AFPTM, CEA, CA199, CHROMGRNA in the last 8760 hours.  Assessment and Plan:  T12 compression fracture with pain 7/10 today  He has been NPO and has not taken any blood thinners.  Will proceed with T12 Kyphoplasty/vertebroplasty today by Dr. Estanislado Pandy  Risks and Benefits discussed with the patient including, but not limited to education regarding the natural healing process of  compression fractures without intervention, bleeding, infection, cement migration which may cause spinal cord damage, paralysis, pulmonary embolism or even death.  All of the patient's questions were answered, patient is agreeable to proceed. Consent signed and in chart.   Thank you for this interesting consult.  I greatly enjoyed meeting EDISON NICHOLSON and look forward to participating in their care.  A copy of this report was sent to the requesting provider on this date.  Signed: Murrell Redden  PA-C  11/15/2014, 9:49 AM   I spent a total of  15 Minutes  in face to face in clinical consultation, greater than 50%  of which was counseling/coordinating care for kyphoplasty/vertebroplasty

## 2014-11-15 NOTE — Procedures (Signed)
S/P T 12 balloon KP 

## 2014-11-15 NOTE — Discharge Instructions (Signed)
1. No stooping,bending or lifting more than 10 lbs for 2 weeks. °2.Use walker to ambulate for 2 weeks . °3.RTC in 2 weeks °KYPHOPLASTY/VERTEBROPLASTY DISCHARGE INSTRUCTIONS ° °Medications: (check all that apply) ° °   Resume all home medications as before procedure. °   °           °  °Continue your pain medications as prescribed as needed.  Over the next 3-5 days, decrease your pain medication as tolerated.  Over the counter medications (i.e. Tylenol, ibuprofen, and aleve) may be substituted once severe/moderate pain symptoms have subsided. ° ° Wound Care: °- Bandages may be removed the day following your procedure.  You may get your incision wet once bandages are removed.  Bandaids may be used to cover the incisions until scab formation.  Topical ointments are optional. ° °- If you develop a fever greater than 101 degrees, have increased skin redness at the incision sites or pus-like oozing from incisions occurring within 1 week of the procedure, contact radiology at [phone] or [phone]. ° °- Ice pack to back for 15-20 minutes 2-3 time per day for first 2-3 days post procedure.  The ice will expedite muscle healing and help with the pain from the incisions. ° ° Activity: °- Bedrest today with limited activity for 24 hours post procedure. ° °- No driving for 48 hours. ° °- Increase your activity as tolerated after bedrest (with assistance if necessary). ° °- Refrain from any strenuous activity or heavy lifting (greater than 10 lbs.). ° ° Follow up: °- Contact radiology at [phone] or [phone] if any questions/concerns. ° °- A physician assistant from radiology will contact you in approximately 1 week. ° °- If a biopsy was performed at the time of your procedure, your referring physician should receive the results in usually 2-3 days. ° ° ° ° ° ° ° ° °

## 2014-11-18 ENCOUNTER — Other Ambulatory Visit (HOSPITAL_COMMUNITY): Payer: Self-pay | Admitting: Interventional Radiology

## 2014-11-18 DIAGNOSIS — M549 Dorsalgia, unspecified: Secondary | ICD-10-CM

## 2014-11-18 DIAGNOSIS — IMO0002 Reserved for concepts with insufficient information to code with codable children: Secondary | ICD-10-CM

## 2014-11-29 ENCOUNTER — Ambulatory Visit (HOSPITAL_COMMUNITY)
Admission: RE | Admit: 2014-11-29 | Discharge: 2014-11-29 | Disposition: A | Discharge status: Home / Self Care | Payer: Medicare Other | Source: Ambulatory Visit | Attending: Interventional Radiology | Admitting: Interventional Radiology

## 2014-11-29 DIAGNOSIS — M549 Dorsalgia, unspecified: Secondary | ICD-10-CM

## 2014-11-29 DIAGNOSIS — IMO0002 Reserved for concepts with insufficient information to code with codable children: Secondary | ICD-10-CM

## 2015-03-17 ENCOUNTER — Telehealth: Payer: Self-pay | Admitting: *Deleted

## 2015-03-17 NOTE — Telephone Encounter (Signed)
Pt wife calling to see Dr. Harl Bowie for recent SOB/chest pain. Pt took nitroglycerin Friday night which relieved chest pain and has not taken any since then, but still having some SOB. Denies dizziness or active CP since Friday night. Made appt 12/1 with Dr. Harl Bowie in [place] office and informed staff of add on. Pt wife agreeable to take pt to ED if active CP occurs or pt takes more than 2 nitroglycerin prior to appt.

## 2015-03-20 ENCOUNTER — Encounter: Payer: Self-pay | Admitting: Cardiology

## 2015-03-20 ENCOUNTER — Ambulatory Visit (INDEPENDENT_AMBULATORY_CARE_PROVIDER_SITE_OTHER): Payer: Medicare Other | Admitting: Cardiology

## 2015-03-20 VITALS — BP 132/70 | HR 99 | Ht 75.0 in | Wt 177.0 lb

## 2015-03-20 DIAGNOSIS — R072 Precordial pain: Secondary | ICD-10-CM

## 2015-03-20 DIAGNOSIS — I25118 Atherosclerotic heart disease of native coronary artery with other forms of angina pectoris: Secondary | ICD-10-CM

## 2015-03-20 DIAGNOSIS — I6523 Occlusion and stenosis of bilateral carotid arteries: Secondary | ICD-10-CM | POA: Diagnosis not present

## 2015-03-20 DIAGNOSIS — R0602 Shortness of breath: Secondary | ICD-10-CM

## 2015-03-20 MED ORDER — ISOSORBIDE MONONITRATE ER 30 MG PO TB24: 15.0000 mg | ORAL_TABLET | Freq: Every day | ORAL | Status: DC

## 2015-03-20 NOTE — Progress Notes (Signed)
Patient ID: TORION SHAAK, male   DOB: [birth year], 74 y.o.   MRN: [id number]     Clinical Summary Mr. Fromm is a 74 y.o.male seen today for follow up of the following medical problems. This is a focused visit on recent chest pain symptoms.   1. CAD: prior stents in 2000 and 2001. Last cath 2008 w/ nonobstructive disease. Normal LVEF 65% by echo 2012.  - episode last weekend of chest pain. Occurred while watching tv, sharp pain left side 8-9/10. Felt weak, felt SOB, mildly sweaty. Not positional. Pain lasted approx 5 minutes. Took one NG x1 with some relief. Another episode Monday, occurred while watching tv. More mild sharp pain, took NG x 1 and resolved - reports some new DOE, example walking back and forth to mailbox.    Past Medical History  Diagnosis Date  . CAD (coronary artery disease)     Normal LV function .Status post stent placement to the right coronary artery in 2000 and 2001Obstructive coronary artery disease in November 2008 by catheterization   . Hyperlipidemia   . COPD (chronic obstructive pulmonary disease) ([place])   . Carotid artery disease ([place])   . Hypertension     pt denies, no meds  . GERD (gastroesophageal reflux disease)      Allergies  Allergen Reactions  . Other Itching, Rash and Other (See Comments)    Some medication OTC he took for headache caused a rash     Current Outpatient Prescriptions  Medication Sig Dispense Refill  . aspirin 81 MG tablet Take 81 mg by mouth daily.    Marland Kitchen atorvastatin (LIPITOR) 80 MG tablet Take 1 tablet (80 mg total) by mouth daily. 90 tablet 3  . nitroGLYCERIN (NITROSTAT) 0.4 MG SL tablet Place 0.4 mg under the tongue every 5 (five) minutes as needed for chest pain.     Marland Kitchen PROAIR HFA 108 (90 BASE) MCG/ACT inhaler Inhale 2 puffs into the lungs every 4 (four) hours as needed for wheezing.      No current facility-administered medications for this visit.     Past Surgical History  Procedure Laterality Date  . Cardiac  catheterization  02/2007  . Hydrocele excision Right 04/24/2013    Procedure: HYDROCELECTOMY ADULT;  Surgeon: Hanley Ben, MD;  Location: [facility];  Service: Urology;  Laterality: Right;  . Fracture surgery      right arm  . Cardiac stents      2 stents placed   . Total hip arthroplasty Left 02/19/2014    Procedure: TOTAL HIP ARTHROPLASTY;  Surgeon: Garald Balding, MD;  Location: [place];  Service: Orthopedics;  Laterality: Left;  . Joint replacement Left Nov. 3, 2015    Hip  . Carotid endarterectomy Bilateral 2010       ([facility])  CE     Allergies  Allergen Reactions  . Other Itching, Rash and Other (See Comments)    Some medication OTC he took for headache caused a rash      Family History  Problem Relation Age of Onset  . Heart disease Mother     Before age 37  . Heart disease Father   . Heart attack Father      Social History Mr. Colaluca reports that he has been smoking Cigarettes.  He has a 55 pack-year smoking history. He has never used smokeless tobacco. Mr. Barrons reports that he does not drink alcohol.   Review of Systems CONSTITUTIONAL: No weight loss, fever, chills,  weakness or fatigue.  HEENT: Eyes: No visual loss, blurred vision, double vision or yellow sclerae.No hearing loss, sneezing, congestion, runny nose or sore throat.  SKIN: No rash or itching.  CARDIOVASCULAR: per hpi RESPIRATORY: No shortness of breath, cough or sputum.  GASTROINTESTINAL: No anorexia, nausea, vomiting or diarrhea. No abdominal pain or blood.  GENITOURINARY: No burning on urination, no polyuria NEUROLOGICAL: No headache, dizziness, syncope, paralysis, ataxia, numbness or tingling in the extremities. No change in bowel or bladder control.  MUSCULOSKELETAL: No muscle, back pain, joint pain or stiffness.  LYMPHATICS: No enlarged nodes. No history of splenectomy.  PSYCHIATRIC: No history of depression or anxiety.  ENDOCRINOLOGIC: No reports of sweating, cold  or heat intolerance. No polyuria or polydipsia.  Marland Kitchen   Physical Examination Filed Vitals:   03/20/15 0811  BP: 132/70  Pulse: 99   Filed Weights   03/20/15 0811  Weight: 177 lb (80.287 kg)    Gen: resting comfortably, no acute distress HEENT: no scleral icterus, pupils equal round and reactive, no palptable cervical adenopathy,  CV: RRR, no m/r/g, no jvd Resp: bilateral wheezing GI: abdomen is soft, non-tender, non-distended, normal bowel sounds, no hepatosplenomegaly MSK: extremities are warm, no edema.  Skin: warm, no rash Neuro:  no focal deficits Psych: appropriate affect   Diagnostic Studies  12/12 DSE: Baseline LVEF 55-60%. Negative for ischemia.   07/2012 Carotids: patent bilaterally w/ minimal plaque/hyperplasia, vertebals normal bilateral   01/02/13 EKG: SR, normal axis, no ischemic changes    Assessment and Plan   1. CAD - recent episodes of chest pain - will obtain stress test. Due to active wheezing will order dobutamine nuclear stress test.  - start imdur 15mg  daily as antianginal, he will hold on day of stress test.   F/u 1 month     Arnoldo Lenis, M.D.

## 2015-03-20 NOTE — Patient Instructions (Signed)
Your physician recommends that you schedule a follow-up appointment in: 1 month with Dr.Branch  Your physician recommends that you continue on your current medications as directed. Please refer to the Current Medication list given to you today.   If you need a refill on your cardiac medications before your next appointment, please call your pharmacy.   Your physician has requested that you have a dobutamine echocardiogram. For further information please visit [email or web address]. Please follow instruction sheet as given.  PLEASE HOLD IMDUR THE MORNING OF TEST      Thank you for choosing [facility] !

## 2015-03-21 ENCOUNTER — Other Ambulatory Visit: Payer: Self-pay | Admitting: *Deleted

## 2015-03-21 DIAGNOSIS — R079 Chest pain, unspecified: Secondary | ICD-10-CM

## 2015-03-24 ENCOUNTER — Encounter (HOSPITAL_COMMUNITY)
Admission: RE | Admit: 2015-03-24 | Discharge: 2015-03-24 | Disposition: A | Discharge status: Home / Self Care | Payer: Medicare Other | Source: Ambulatory Visit | Attending: Cardiology | Admitting: Cardiology

## 2015-03-24 ENCOUNTER — Encounter (HOSPITAL_COMMUNITY): Payer: Self-pay

## 2015-03-24 ENCOUNTER — Inpatient Hospital Stay (HOSPITAL_COMMUNITY): Admission: RE | Admit: 2015-03-24 | Payer: Medicare Other | Source: Ambulatory Visit

## 2015-03-24 DIAGNOSIS — R079 Chest pain, unspecified: Secondary | ICD-10-CM | POA: Diagnosis not present

## 2015-03-24 LAB — NM MYOCAR MULTI W/SPECT W/WALL MOTION / EF
CHL CUP MPHR: 147 {beats}/min
CHL CUP NUCLEAR SRS: 2
CSEPPHR: 150 {beats}/min
LV sys vol: 41 mL
LVDIAVOL: 97 mL
Percent HR: 102 %
RATE: 0.52
Rest HR: 55 {beats}/min
SDS: 2
SSS: 4
TID: 0.91

## 2015-03-24 MED ORDER — TECHNETIUM TC 99M SESTAMIBI - CARDIOLITE
10.0000 | Freq: Once | INTRAVENOUS | Status: AC | PRN
  Administered 2015-03-24: 10 via INTRAVENOUS

## 2015-03-24 MED ORDER — SODIUM CHLORIDE 0.9 % IJ SOLN
INTRAMUSCULAR | Status: AC
  Filled 2015-03-24: qty 45

## 2015-03-24 MED ORDER — SODIUM CHLORIDE 0.9 % IJ SOLN
INTRAMUSCULAR | Status: AC
  Administered 2015-03-24: 10:00:00 via INTRAVENOUS
  Filled 2015-03-24: qty 3

## 2015-03-24 MED ORDER — DOBUTAMINE IN D5W 4-5 MG/ML-% IV SOLN
INTRAVENOUS | Status: AC
  Administered 2015-03-24: 1000000 ug via INTRAVENOUS
  Filled 2015-03-24: qty 250

## 2015-03-24 MED ORDER — TECHNETIUM TC 99M SESTAMIBI GENERIC - CARDIOLITE
30.0000 | Freq: Once | INTRAVENOUS | Status: AC | PRN
  Administered 2015-03-24: 30 via INTRAVENOUS

## 2015-03-25 ENCOUNTER — Telehealth: Payer: Self-pay | Admitting: *Deleted

## 2015-03-25 NOTE — Telephone Encounter (Signed)
-----   Message from Arnoldo Lenis, MD sent at 03/25/2015 12:35 PM EST ----- Stress test overall looks good, no evidence of new blockages. Will discuss further at follow up  Zandra Abts MD

## 2015-03-25 NOTE — Telephone Encounter (Signed)
Pt aware, routed to pcp 

## 2015-04-24 ENCOUNTER — Encounter: Payer: Self-pay | Admitting: Cardiology

## 2015-04-24 ENCOUNTER — Encounter: Payer: Medicare Other | Admitting: Cardiology

## 2015-04-24 NOTE — Progress Notes (Signed)
Patient ID: Joseph Forbes, male   DOB: 1940-06-04, 75 y.o.   MRN: [id number]   Encounter opened in error

## 2015-06-10 ENCOUNTER — Encounter: Payer: Self-pay | Admitting: Cardiology

## 2015-06-10 ENCOUNTER — Ambulatory Visit (INDEPENDENT_AMBULATORY_CARE_PROVIDER_SITE_OTHER): Payer: Medicare Other | Admitting: Cardiology

## 2015-06-10 VITALS — BP 122/73 | HR 84 | Ht 75.0 in | Wt 179.0 lb

## 2015-06-10 DIAGNOSIS — E785 Hyperlipidemia, unspecified: Secondary | ICD-10-CM

## 2015-06-10 DIAGNOSIS — I6523 Occlusion and stenosis of bilateral carotid arteries: Secondary | ICD-10-CM | POA: Diagnosis not present

## 2015-06-10 DIAGNOSIS — I2510 Atherosclerotic heart disease of native coronary artery without angina pectoris: Secondary | ICD-10-CM

## 2015-06-10 DIAGNOSIS — R0602 Shortness of breath: Secondary | ICD-10-CM | POA: Diagnosis not present

## 2015-06-10 NOTE — Progress Notes (Signed)
Patient ID: NIGUEL HARGAN, male   DOB: [birth year], 75 y.o.   MRN: [id number]     Clinical Summary Mr. Gawronski is a 75 y.o.male seen today for follow up of the following medical problems.   1. CAD: prior stents in 2000 and 2001. Last cath 2008 w/ nonobstructive disease. Normal LVEF 65% by echo 2012. - nuclear stress 03/2015 which showed no specific ischemia.   - denies any recent chest pain. Stable SOB he attributes to COPD.  - compliant with meds  2. Carotid stenosis  - prior bilateral carotid endarterectomies, recent US shows good patency. Followed by [facility]  - denies any recent neuro symptoms  3. HL - 08/2014 TC 132 HDL 54 LDL 65  - compliant withs tatin  4. Tobacco abuse - not interested in quitting. Smoked x 60 + years.      Past Medical History  Diagnosis Date  . CAD (coronary artery disease)     Normal LV function .Status post stent placement to the right coronary artery in 2000 and 2001Obstructive coronary artery disease in November 2008 by catheterization   . Hyperlipidemia   . COPD (chronic obstructive pulmonary disease) ([place])   . Carotid artery disease ([place])   . Hypertension     pt denies, no meds  . GERD (gastroesophageal reflux disease)      Allergies  Allergen Reactions  . Other Itching, Rash and Other (See Comments)    Some medication OTC he took for headache caused a rash     Current Outpatient Prescriptions  Medication Sig Dispense Refill  . aspirin 81 MG tablet Take 81 mg by mouth daily.    Marland Kitchen atorvastatin (LIPITOR) 80 MG tablet Take 1 tablet (80 mg total) by mouth daily. 90 tablet 3  . isosorbide mononitrate (IMDUR) 30 MG 24 hr tablet Take 0.5 tablets (15 mg total) by mouth daily. 45 tablet 3  . nitroGLYCERIN (NITROSTAT) 0.4 MG SL tablet Place 0.4 mg under the tongue every 5 (five) minutes as needed for chest pain.     Marland Kitchen PROAIR HFA 108 (90 BASE) MCG/ACT inhaler Inhale 2 puffs into the lungs every 4 (four) hours as needed for wheezing.       No current facility-administered medications for this visit.     Past Surgical History  Procedure Laterality Date  . Cardiac catheterization  02/2007  . Hydrocele excision Right 04/24/2013    Procedure: HYDROCELECTOMY ADULT;  Surgeon: Hanley Ben, MD;  [facility]: [facility];  Service: Urology;  Laterality: Right;  . Fracture surgery      right arm  . Cardiac stents      2 stents placed   . Total hip arthroplasty Left 02/19/2014    Procedure: TOTAL HIP ARTHROPLASTY;  Surgeon: Garald Balding, MD;  Location: [place];  Service: Orthopedics;  Laterality: Left;  . Joint replacement Left Nov. 3, 2015    Hip  . Carotid endarterectomy Bilateral 2010       ([facility])  CE     Allergies  Allergen Reactions  . Other Itching, Rash and Other (See Comments)    Some medication OTC he took for headache caused a rash      Family History  Problem Relation Age of Onset  . Heart disease Mother     Before age 66  . Heart disease Father   . Heart attack Father      Social History Mr. Muzio reports that he has been smoking Cigarettes.  He  has a 55 pack-year smoking history. He has never used smokeless tobacco. Mr. Isaac reports that he does not drink alcohol.   Review of Systems CONSTITUTIONAL: No weight loss, fever, chills, weakness or fatigue.  HEENT: Eyes: No visual loss, blurred vision, double vision or yellow sclerae.No hearing loss, sneezing, congestion, runny nose or sore throat.  SKIN: No rash or itching.  CARDIOVASCULAR: per hpi RESPIRATORY: No cough or sputum.  GASTROINTESTINAL: No anorexia, nausea, vomiting or diarrhea. No abdominal pain or blood.  GENITOURINARY: No burning on urination, no polyuria NEUROLOGICAL: No headache, dizziness, syncope, paralysis, ataxia, numbness or tingling in the extremities. No change in bowel or bladder control.  MUSCULOSKELETAL: No muscle, back pain, joint pain or stiffness.  LYMPHATICS: No enlarged nodes. No  history of splenectomy.  PSYCHIATRIC: No history of depression or anxiety.  ENDOCRINOLOGIC: No reports of sweating, cold or heat intolerance. No polyuria or polydipsia.  Marland Kitchen   Physical Examination Filed Vitals:   06/10/15 1038  BP: 122/73  Pulse: 84   Filed Vitals:   06/10/15 1038  Height: 6\' 3"  (1.905 m)  Weight: 179 lb (81.194 kg)    Gen: resting comfortably, no acute distress HEENT: no scleral icterus, pupils equal round and reactive, no palptable cervical adenopathy,  CV: RRR, no m/r/g, no jvd Resp: Clear to auscultation bilaterally GI: abdomen is soft, non-tender, non-distended, normal bowel sounds, no hepatosplenomegaly MSK: extremities are warm, no edema.  Skin: warm, no rash Neuro:  no focal deficits Psych: appropriate affect   Diagnostic Studies 12/12 DSE: Baseline LVEF 55-60%. Negative for ischemia.   07/2012 Carotids: patent bilaterally w/ minimal plaque/hyperplasia, vertebals normal bilateral   01/02/13 EKG: SR, normal axis, no ischemic changes  03/2015 Dobutamine nuclear stress  Defect 1: There is a small defect of moderate severity present in the basal inferoseptal, mid inferoseptal, apical septal and apical inferior location. This is most likely due to soft tissue attenuation artifact. However, myocardial scar cannot entirely be ruled out.  This is a low risk study. No ischemic territories.  Nuclear stress EF: 58%.    Assessment and Plan   1. CAD - no recent symptoms, continue current meds  2. Carotid stenosis - continue to follow with vascular  3. Hyperlipidemia - lipids at goal, continue high dose statin in setting of known CAD  4. Tobacco abuse - counseled on cessation - notes some SOB, we will obtain PFTs      Arnoldo Lenis, M.D.

## 2015-06-10 NOTE — Patient Instructions (Signed)
Your physician wants you to follow-up in: 6 MONTHS WITH DR BRANCH You will receive a reminder letter in the mail two months in advance. If you don't receive a letter, please call our office to schedule the follow-up appointment.  Your physician recommends that you continue on your current medications as directed. Please refer to the Current Medication list given to you today.  Your physician has recommended that you have a pulmonary function test. Pulmonary Function Tests are a group of tests that measure how well air moves in and out of your lungs.  Thank you for choosing [facility]!!    

## 2015-06-12 LAB — PULMONARY FUNCTION TEST

## 2015-06-24 ENCOUNTER — Telehealth: Payer: Self-pay | Admitting: *Deleted

## 2015-06-24 MED ORDER — TIOTROPIUM BROMIDE MONOHYDRATE 18 MCG IN CAPS: 18.0000 ug | ORAL_CAPSULE | Freq: Every day | RESPIRATORY_TRACT | Status: DC

## 2015-06-24 NOTE — Telephone Encounter (Signed)
-----   Message from Arnoldo Lenis, MD sent at 06/23/2015  4:23 PM EST ----- Breathing tests show severe emphysema. Please forward to pcp and start spiriva 7mcg inhaled daily.

## 2015-06-24 NOTE — Telephone Encounter (Signed)
Pt aware, routed to pcp, inhaler sent to pharmacy

## 2015-08-20 ENCOUNTER — Other Ambulatory Visit: Payer: Self-pay | Admitting: Cardiology

## 2015-09-02 ENCOUNTER — Encounter: Payer: Self-pay | Admitting: Family

## 2015-09-08 ENCOUNTER — Other Ambulatory Visit: Payer: Self-pay | Admitting: *Deleted

## 2015-09-08 DIAGNOSIS — I6523 Occlusion and stenosis of bilateral carotid arteries: Secondary | ICD-10-CM

## 2015-09-09 ENCOUNTER — Ambulatory Visit (HOSPITAL_COMMUNITY)
Admission: RE | Admit: 2015-09-09 | Discharge: 2015-09-09 | Disposition: A | Discharge status: Home / Self Care | Payer: Medicare Other | Source: Ambulatory Visit | Attending: Family | Admitting: Family

## 2015-09-09 ENCOUNTER — Ambulatory Visit (INDEPENDENT_AMBULATORY_CARE_PROVIDER_SITE_OTHER): Payer: Medicare Other | Admitting: Family

## 2015-09-09 ENCOUNTER — Encounter: Payer: Self-pay | Admitting: Family

## 2015-09-09 ENCOUNTER — Encounter: Payer: Self-pay | Admitting: Cardiology

## 2015-09-09 ENCOUNTER — Ambulatory Visit (INDEPENDENT_AMBULATORY_CARE_PROVIDER_SITE_OTHER): Payer: Medicare Other | Admitting: Cardiology

## 2015-09-09 VITALS — BP 123/69 | HR 64 | Ht 75.0 in | Wt 181.0 lb

## 2015-09-09 VITALS — BP 107/65 | HR 63 | Temp 98.7°F | Resp 16 | Ht 75.0 in | Wt 180.0 lb

## 2015-09-09 DIAGNOSIS — Z720 Tobacco use: Secondary | ICD-10-CM

## 2015-09-09 DIAGNOSIS — Z9889 Other specified postprocedural states: Secondary | ICD-10-CM | POA: Diagnosis not present

## 2015-09-09 DIAGNOSIS — I6523 Occlusion and stenosis of bilateral carotid arteries: Secondary | ICD-10-CM | POA: Insufficient documentation

## 2015-09-09 DIAGNOSIS — Z48812 Encounter for surgical aftercare following surgery on the circulatory system: Secondary | ICD-10-CM | POA: Diagnosis not present

## 2015-09-09 DIAGNOSIS — E785 Hyperlipidemia, unspecified: Secondary | ICD-10-CM

## 2015-09-09 DIAGNOSIS — R079 Chest pain, unspecified: Secondary | ICD-10-CM | POA: Diagnosis not present

## 2015-09-09 DIAGNOSIS — F17200 Nicotine dependence, unspecified, uncomplicated: Secondary | ICD-10-CM

## 2015-09-09 DIAGNOSIS — I2510 Atherosclerotic heart disease of native coronary artery without angina pectoris: Secondary | ICD-10-CM

## 2015-09-09 DIAGNOSIS — J441 Chronic obstructive pulmonary disease with (acute) exacerbation: Secondary | ICD-10-CM

## 2015-09-09 MED ORDER — PREDNISONE 20 MG PO TABS: ORAL_TABLET | ORAL | Status: DC

## 2015-09-09 NOTE — Patient Instructions (Addendum)
Stroke Prevention Some medical conditions and behaviors are associated with an increased chance of having a stroke. You may prevent a stroke by making healthy choices and managing medical conditions. HOW CAN I REDUCE MY RISK OF HAVING A STROKE?   Stay physically active. Get at least 30 minutes of activity on most or all days.  Do not smoke. It may also be helpful to avoid exposure to secondhand smoke.  Limit alcohol use. Moderate alcohol use is considered to be:  No more than 2 drinks per day for men.  No more than 1 drink per day for nonpregnant women.  Eat healthy foods. This involves:  Eating 5 or more servings of fruits and vegetables a day.  Making dietary changes that address high blood pressure (hypertension), high cholesterol, diabetes, or obesity.  Manage your cholesterol levels.  Making food choices that are high in fiber and low in saturated fat, trans fat, and cholesterol may control cholesterol levels.  Take any prescribed medicines to control cholesterol as directed by your health care provider.  Manage your diabetes.  Controlling your carbohydrate and sugar intake is recommended to manage diabetes.  Take any prescribed medicines to control diabetes as directed by your health care provider.  Control your hypertension.  Making food choices that are low in salt (sodium), saturated fat, trans fat, and cholesterol is recommended to manage hypertension.  Ask your health care provider if you need treatment to lower your blood pressure. Take any prescribed medicines to control hypertension as directed by your health care provider.  If you are 18-39 years of age, have your blood pressure checked every 3-5 years. If you are 40 years of age or older, have your blood pressure checked every year.  Maintain a healthy weight.  Reducing calorie intake and making food choices that are low in sodium, saturated fat, trans fat, and cholesterol are recommended to manage  weight.  Stop drug abuse.  Avoid taking birth control pills.  Talk to your health care provider about the risks of taking birth control pills if you are over 35 years old, smoke, get migraines, or have ever had a blood clot.  Get evaluated for sleep disorders (sleep apnea).  Talk to your health care provider about getting a sleep evaluation if you snore a lot or have excessive sleepiness.  Take medicines only as directed by your health care provider.  For some people, aspirin or blood thinners (anticoagulants) are helpful in reducing the risk of forming abnormal blood clots that can lead to stroke. If you have the irregular heart rhythm of atrial fibrillation, you should be on a blood thinner unless there is a good reason you cannot take them.  Understand all your medicine instructions.  Make sure that other conditions (such as anemia or atherosclerosis) are addressed. SEEK IMMEDIATE MEDICAL CARE IF:   You have sudden weakness or numbness of the face, arm, or leg, especially on one side of the body.  Your face or eyelid droops to one side.  You have sudden confusion.  You have trouble speaking (aphasia) or understanding.  You have sudden trouble seeing in one or both eyes.  You have sudden trouble walking.  You have dizziness.  You have a loss of balance or coordination.  You have a sudden, severe headache with no known cause.  You have new chest pain or an irregular heartbeat. Any of these symptoms may represent a serious problem that is an emergency. Do not wait to see if the symptoms will   go away. Get medical help at once. Call your local emergency services (911 in [place] Do not drive yourself to the hospital.   This information is not intended to replace advice given to you by your health care provider. Make sure you discuss any questions you have with your health care provider.   Document Released: 05/13/2004 Document Revised: 04/26/2014 Document Reviewed:  10/06/2012 Elsevier Interactive Patient Education 2016 Elsevier Inc.     Steps to Quit Smoking  Smoking tobacco can be harmful to your health and can affect almost every organ in your body. Smoking puts you, and those around you, at risk for developing many serious chronic diseases. Quitting smoking is difficult, but it is one of the best things that you can do for your health. It is never too late to quit. WHAT ARE THE BENEFITS OF QUITTING SMOKING? When you quit smoking, you lower your risk of developing serious diseases and conditions, such as:  Lung cancer or lung disease, such as COPD.  Heart disease.  Stroke.  Heart attack.  Infertility.  Osteoporosis and bone fractures. Additionally, symptoms such as coughing, wheezing, and shortness of breath may get better when you quit. You may also find that you get sick less often because your body is stronger at fighting off colds and infections. If you are pregnant, quitting smoking can help to reduce your chances of having a baby of low birth weight. HOW DO I GET READY TO QUIT? When you decide to quit smoking, create a plan to make sure that you are successful. Before you quit:  Pick a date to quit. Set a date within the next two weeks to give you time to prepare.  Write down the reasons why you are quitting. Keep this list in places where you will see it often, such as on your bathroom mirror or in your car or wallet.  Identify the people, places, things, and activities that make you want to smoke (triggers) and avoid them. Make sure to take these actions:  Throw away all cigarettes at home, at work, and in your car.  Throw away smoking accessories, such as ashtrays and lighters.  Clean your car and make sure to empty the ashtray.  Clean your home, including curtains and carpets.  Tell your family, friends, and coworkers that you are quitting. Support from your loved ones can make quitting easier.  Talk with your health care  provider about your options for quitting smoking.  Find out what treatment options are covered by your health insurance. WHAT STRATEGIES CAN I USE TO QUIT SMOKING?  Talk with your healthcare provider about different strategies to quit smoking. Some strategies include:  Quitting smoking altogether instead of gradually lessening how much you smoke over a period of time. Research shows that quitting "cold turkey" is more successful than gradually quitting.  Attending in-person counseling to help you build problem-solving skills. You are more likely to have success in quitting if you attend several counseling sessions. Even short sessions of 10 minutes can be effective.  Finding resources and support systems that can help you to quit smoking and remain smoke-free after you quit. These resources are most helpful when you use them often. They can include:  Online chats with a counselor.  Telephone quitlines.  Printed self-help materials.  Support groups or group counseling.  Text messaging programs.  Mobile phone applications.  Taking medicines to help you quit smoking. (If you are pregnant or breastfeeding, talk with your health care provider first.) Some   medicines contain nicotine and some do not. Both types of medicines help with cravings, but the medicines that include nicotine help to relieve withdrawal symptoms. Your health care provider may recommend:  Nicotine patches, gum, or lozenges.  Nicotine inhalers or sprays.  Non-nicotine medicine that is taken by mouth. Talk with your health care provider about combining strategies, such as taking medicines while you are also receiving in-person counseling. Using these two strategies together makes you more likely to succeed in quitting than if you used either strategy on its own. If you are pregnant or breastfeeding, talk with your health care provider about finding counseling or other support strategies to quit smoking. Do not take  medicine to help you quit smoking unless told to do so by your health care provider. WHAT THINGS CAN I DO TO MAKE IT EASIER TO QUIT? Quitting smoking might feel overwhelming at first, but there is a lot that you can do to make it easier. Take these important actions:  Reach out to your family and friends and ask that they support and encourage you during this time. Call telephone quitlines, reach out to support groups, or work with a counselor for support.  Ask people who smoke to avoid smoking around you.  Avoid places that trigger you to smoke, such as bars, parties, or smoke-break areas at work.  Spend time around people who do not smoke.  Lessen stress in your life, because stress can be a smoking trigger for some people. To lessen stress, try:  Exercising regularly.  Deep-breathing exercises.  Yoga.  Meditating.  Performing a body scan. This involves closing your eyes, scanning your body from head to toe, and noticing which parts of your body are particularly tense. Purposefully relax the muscles in those areas.  Download or purchase mobile phone or tablet apps (applications) that can help you stick to your quit plan by providing reminders, tips, and encouragement. There are many free apps, such as QuitGuide from the CDC (Centers for Disease Control and Prevention). You can find other support for quitting smoking (smoking cessation) through smokefree.gov and other websites. HOW WILL I FEEL WHEN I QUIT SMOKING? Within the first 24 hours of quitting smoking, you may start to feel some withdrawal symptoms. These symptoms are usually most noticeable 2-3 days after quitting, but they usually do not last beyond 2-3 weeks. Changes or symptoms that you might experience include:  Mood swings.  Restlessness, anxiety, or irritation.  Difficulty concentrating.  Dizziness.  Strong cravings for sugary foods in addition to nicotine.  Mild weight  gain.  Constipation.  Nausea.  Coughing or a sore throat.  Changes in how your medicines work in your body.  A depressed mood.  Difficulty sleeping (insomnia). After the first 2-3 weeks of quitting, you may start to notice more positive results, such as:  Improved sense of smell and taste.  Decreased coughing and sore throat.  Slower heart rate.  Lower blood pressure.  Clearer skin.  The ability to breathe more easily.  Fewer sick days. Quitting smoking is very challenging for most people. Do not get discouraged if you are not successful the first time. Some people need to make many attempts to quit before they achieve long-term success. Do your best to stick to your quit plan, and talk with your health care provider if you have any questions or concerns.   This information is not intended to replace advice given to you by your health care provider. Make sure you discuss any questions   you have with your health care provider.   Document Released: 03/30/2001 Document Revised: 08/20/2014 Document Reviewed: 08/20/2014 Elsevier Interactive Patient Education 2016 Elsevier Inc.  

## 2015-09-09 NOTE — Patient Instructions (Signed)
Your physician wants you to follow-up in: [place] DR. BRANCH You will receive a reminder letter in the mail two months in advance. If you don't receive a letter, please call our office to schedule the follow-up appointment.  Your physician has recommended you make the following change in your medication:   TAKE PREDNISONE 40 MG ( 2 TABS OF 20 MG) DAILY FOR 4 DAYS THEN STOP  Thank you for choosing [facility]!!

## 2015-09-09 NOTE — Progress Notes (Signed)
Chief Complaint: Follow up Extracranial Carotid Artery Stenosis   History of Present Illness  Joseph Forbes is a 75 y.o. male patient of Dr. Kellie Simmering who is status post bilateral carotid endarterectomies in 2010. He returns today for routine surveillance.  He denies any history of TIA or stroke symptoms.Specifically he denies a history of amaurosis fugax or monocular blindness, unilateral facial drooping, hemiplegia/hemiparesis. or receptive or expressive aphasia.   Pt denies claudication symptoms in legs with walking. 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 Pt's son states pt's breathing is worsening.  Pt Diabetic: no Pt smoker: smoker (1 ppd , started at age 74 yrs)  Pt meds include: Statin : Yes ASA: Yes Other anticoagulants/antiplatelets: no   Past Medical History  Diagnosis Date  . CAD (coronary artery disease)     Normal LV function .Status post stent placement to the right coronary artery in 2000 and 2001Obstructive coronary artery disease in November 2008 by catheterization   . Hyperlipidemia   . COPD (chronic obstructive pulmonary disease) ([place])   . Carotid artery disease ([place])   . Hypertension     pt denies, no meds  . GERD (gastroesophageal reflux disease)     Social History Social History  Substance Use Topics  . Smoking status: Current Every Day Smoker -- 1.00 packs/day for 55 years    Types: Cigarettes    Start date: 06/19/1952  . Smokeless tobacco: Never Used  . Alcohol Use: No    Family History Family History  Problem Relation Age of Onset  . Heart disease Mother     Before age 79  . Heart disease Father   . Heart attack Father     Surgical History Past Surgical History  Procedure Laterality Date  . Cardiac catheterization  02/2007  . Hydrocele excision Right 04/24/2013    Procedure: HYDROCELECTOMY ADULT;  Surgeon: Hanley Ben, MD;  Location: [facility];  Service: Urology;  Laterality: Right;  .  Fracture surgery      right arm  . Cardiac stents      2 stents placed   . 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 Joint replacement Left Nov. 3, 2015    Hip  . Carotid endarterectomy Bilateral 2010       (here at [facility])  CE    Allergies  Allergen Reactions  . Other Itching, Rash and Other (See Comments)    Some medication OTC he took for headache caused a rash    Current Outpatient Prescriptions  Medication Sig Dispense Refill  . aspirin 81 MG tablet Take 81 mg by mouth daily.    Marland Kitchen atorvastatin (LIPITOR) 80 MG tablet TAKE ONE TABLET BY MOUTH ONCE DAILY 90 tablet 0  . famotidine (PEPCID) 20 MG tablet Take 1 tablet by mouth daily.    . isosorbide mononitrate (IMDUR) 30 MG 24 hr tablet Take 0.5 tablets (15 mg total) by mouth daily. 45 tablet 3  . nitroGLYCERIN (NITROSTAT) 0.4 MG SL tablet Place 0.4 mg under the tongue every 5 (five) minutes as needed for chest pain.     Marland Kitchen tiotropium (SPIRIVA HANDIHALER) 18 MCG inhalation capsule Place 1 capsule (18 mcg total) into inhaler and inhale daily. 30 capsule 12   No current facility-administered medications for this visit.    Review of Systems : See HPI for  pertinent positives and negatives.  Physical Examination  Filed Vitals:   09/09/15 0912  BP: 107/65  Pulse: 63  Temp: 98.7 F (37.1 C)  TempSrc: Oral  Resp: 16  Height: 6\' 3"  (1.905 m)  Weight: 180 lb (81.647 kg)  SpO2: 95%   Body mass index is 22.5 kg/(m^2).  General: WDWN male in NAD GAIT: normal Eyes: PERRLA Pulmonary: Respirations are non-labored, + rales, + rhonchi, & +wheezing; chronic moist cough.  Cardiac: regular rhythm, no detected murmur.  VASCULAR EXAM Carotid Bruits Left Right   positive Negative   Aorta is not palpable. Radial pulses are 2+ palpable and equal.      Gastrointestinal: soft, nontender, BS WNL, no r/g,no palpable masses.  Musculoskeletal: No muscle atrophy/wasting. M/S 5/5 throughout, Extremities without ischemic changes.  Neurologic: A&O X 3; Appropriate Affect, normal sensation, Speech is normal, CN 2-12 intact except some hearing loss, Pain and light touch intact in extremities, Motor exam as listed above.                Non-Invasive Vascular Imaging CAROTID DUPLEX 09/09/2015   CEREBROVASCULAR DUPLEX EVALUATION    INDICATION: Follow-up carotid disease     PREVIOUS INTERVENTION(S): Right carotid endarterectomy 07/05/2008 Left carotid endarterectomy 01/08/2002    DUPLEX EXAM:     RIGHT  LEFT  Peak Systolic Velocities (cm/s) End Diastolic Velocities (cm/s) Plaque LOCATION Peak Systolic Velocities (cm/s) End Diastolic Velocities (cm/s) Plaque  92 18  CCA PROXIMAL 69 12   83 19  CCA MID 86 21 HM  63 14 HM CCA DISTAL 81 18 HM  183 23 HM ECA 86 10   71 22  ICA PROXIMAL 85 17 HT  103 33  ICA MID 80 22   96 34  ICA DISTAL 87 33      ICA / CCA Ratio (PSV)   Antegrade  Vertebral Flow Antegrade    Brachial Systolic Pressure (mmHg)   Within normal limits  Brachial Artery Waveforms Within normal limits     Plaque Morphology:  HM = Homogeneous, HT = Heterogeneous, CP = Calcific Plaque, SP = Smooth Plaque, IP = Irregular Plaque     ADDITIONAL FINDINGS:     IMPRESSION: 1. Widely patent right carotid endarterectomy without evidence of restenosis or hyperplasia.  2. Patent left carotid endarterectomy with minimal (1%-39%) plaque in the proximal internal carotid artery. 3. Bilateral vertebral artery is antegrade.    Compared to the previous exam:  No significant change compared to prior exam.       Assessment: [name] is a 75 y.o. male who is status post bilateral carotid endarterectomies in 2010. He has no history  of stroke or TIA.  Today's carotid duplex suggests a widely patent right carotid endarterectomy without evidence of restenosis or hyperplasia.  Patent left carotid endarterectomy with minimal (1%-39%) plaque in the proximal internal carotid artery. Bilateral vertebral artery is antegrade. No significant change compared to prior exam.    Fortunately he does not have DM. His primary atherosclerotic risk factor remains active smoking, currently a ppd. He is not contemplating quitting.   Plan: Follow-up in 1 year with Carotid Duplex scan. The patient was counseled re smoking cessation and given several free resources re smoking cessation.     I discussed in depth with the patient the nature of atherosclerosis, and emphasized the importance of maximal medical management including strict control of blood pressure, blood glucose, and lipid levels, obtaining regular exercise, and cessation of smoking.  The patient  is aware that without maximal medical management the underlying atherosclerotic disease process will progress, limiting the benefit of any interventions. The patient was given information about stroke prevention and what symptoms should prompt the patient to seek immediate medical care. Thank you for allowing [place] to participate in this patient's care.  Clemon Chambers, RN, MSN, FNP-C [facility] Office: [phone]  Clinic Physician: Kellie Simmering  09/09/2015 9:22 AM

## 2015-09-09 NOTE — Progress Notes (Signed)
Patient ID: Joseph Forbes, male   DOB: 1940-06-04, 75 y.o.   MRN: [id number]     Clinical Summary Joseph Forbes is a 75 y.o.male seen today for follow up of the following medical problems.   1. CAD: prior stents in 2000 and 2001. Last cath 2008 w/ nonobstructive disease. Normal LVEF 65% by echo 2012. - nuclear stress 03/2015 which showed no specific ischemia.    - episode of chest pain 2 weeks ago. Awoke from sleep. Sharp left side, 9/10. No other associated symptoms. Not postional. Lasted about 25 minutes. Isolated episode. Stable DOE.  - compliant with meds   2. Carotid stenosis  - prior bilateral carotid endarterectomies, recent US shows good patency. Followed by [facility]  - denies any recent neuro symptoms  3. HL - 08/2014 TC 132 HDL 54 LDL 65  - compliant with statin - has upcoming labs with pcp  4. 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 - started on spiriva.    Past Medical History  Diagnosis Date  . CAD (coronary artery disease)     Normal LV function .Status post stent placement to the right coronary artery in 2000 and 2001Obstructive coronary artery disease in November 2008 by catheterization   . Hyperlipidemia   . COPD (chronic obstructive pulmonary disease) ([place])   . Carotid artery disease ([place])   . Hypertension     pt denies, no meds  . GERD (gastroesophageal reflux disease)      Allergies  Allergen Reactions  . Other Itching, Rash and Other (See Comments)    Some medication OTC he took for headache caused a rash     Current Outpatient Prescriptions  Medication Sig Dispense Refill  . aspirin 81 MG tablet Take 81 mg by mouth daily.    Marland Kitchen atorvastatin (LIPITOR) 80 MG tablet TAKE ONE TABLET BY MOUTH ONCE DAILY 90 tablet 0  . famotidine (PEPCID) 20 MG tablet Take 1 tablet by mouth daily.    . isosorbide mononitrate (IMDUR) 30 MG 24 hr tablet Take 0.5 tablets (15 mg total) by mouth daily. 45 tablet 3  . nitroGLYCERIN (NITROSTAT) 0.4 MG SL  tablet Place 0.4 mg under the tongue every 5 (five) minutes as needed for chest pain.     Marland Kitchen tiotropium (SPIRIVA HANDIHALER) 18 MCG inhalation capsule Place 1 capsule (18 mcg total) into inhaler and inhale daily. 30 capsule 12   No current facility-administered medications for this visit.     Past Surgical History  Procedure Laterality Date  . Cardiac catheterization  02/2007  . Hydrocele excision Right 04/24/2013    Procedure: HYDROCELECTOMY ADULT;  Surgeon: Hanley Ben, MD;  Location: [facility];  Service: Urology;  Laterality: Right;  . Fracture surgery      right arm  . Cardiac stents      2 stents placed   . 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 Joint replacement Left Nov. 3, 2015    Hip  . Carotid endarterectomy Bilateral 2010       (here at [facility])  CE     Allergies  Allergen Reactions  . Other Itching, Rash and Other (See Comments)    Some medication OTC he took for headache caused a rash      Family History  Problem Relation Age of Onset  . Heart disease Mother  Before age 2  . Heart disease Father   . Heart attack Father      Social History Joseph Forbes reports that he has been smoking Cigarettes.  He started smoking about 62 years ago. He has a 55 pack-year smoking history. He has never used smokeless tobacco. Joseph Forbes reports that he does not drink alcohol.   Review of Systems CONSTITUTIONAL: No weight loss, fever, chills, weakness or fatigue.  HEENT: Eyes: No visual loss, blurred vision, double vision or yellow sclerae.No hearing loss, sneezing, congestion, runny nose or sore throat.  SKIN: No rash or itching.  CARDIOVASCULAR: per HPI RESPIRATORY: +SOB  GASTROINTESTINAL: No anorexia, nausea, vomiting or diarrhea. No abdominal pain or blood.  GENITOURINARY: No burning on urination, no  polyuria NEUROLOGICAL: No headache, dizziness, syncope, paralysis, ataxia, numbness or tingling in the extremities. No change in bowel or bladder control.  MUSCULOSKELETAL: No muscle, back pain, joint pain or stiffness.  LYMPHATICS: No enlarged nodes. No history of splenectomy.  PSYCHIATRIC: No history of depression or anxiety.  ENDOCRINOLOGIC: No reports of sweating, cold or heat intolerance. No polyuria or polydipsia.  Marland Kitchen   Physical Examination Filed Vitals:   09/09/15 1421  BP: 123/69  Pulse: 64   Filed Vitals:   09/09/15 1421  Height: 6\' 3"  (1.905 m)  Weight: 181 lb (82.101 kg)    Gen: resting comfortably, no acute distress HEENT: no scleral icterus, pupils equal round and reactive, no palptable cervical adenopathy,  CV: RRR, no m/r/g, n ojvd Resp: +bilateral wheezing GI: abdomen is soft, non-tender, non-distended, normal bowel sounds, no hepatosplenomegaly MSK: extremities are warm, no edema.  Skin: warm, no rash Neuro:  no focal deficits Psych: appropriate affect   Diagnostic Studies 12/12 DSE: Baseline LVEF 55-60%. Negative for ischemia.   07/2012 Carotids: patent bilaterally w/ minimal plaque/hyperplasia, vertebals normal bilateral   01/02/13 EKG: SR, normal axis, no ischemic changes  03/2015 Dobutamine nuclear stress  Defect 1: There is a small defect of moderate severity present in the basal inferoseptal, mid inferoseptal, apical septal and apical inferior location. This is most likely due to soft tissue attenuation artifact. However, myocardial scar cannot entirely be ruled out.  This is a low risk study. No ischemic territories.  Nuclear stress EF: 58%.    Assessment and Plan  1. CAD - isolated episode of fairly atypical chest pain, will continue to monitor at this time. Negative stress test 03/2015' - EKG in clinic SR without ischemic changes  2. Carotid stenosis - continue to follow with vascular  3. Hyperlipidemia - continue statin, f/u upcoming  pcp labs  4. COPD - active wheezing on exam, reports some SOB - will give 5 day course of prednisone 40mg  daily.   F/u 6 months      Arnoldo Lenis, M.D.

## 2015-11-19 ENCOUNTER — Other Ambulatory Visit: Payer: Self-pay | Admitting: *Deleted

## 2015-11-27 ENCOUNTER — Other Ambulatory Visit: Payer: Self-pay | Admitting: *Deleted

## 2015-11-27 DIAGNOSIS — I6523 Occlusion and stenosis of bilateral carotid arteries: Secondary | ICD-10-CM

## 2016-03-10 ENCOUNTER — Ambulatory Visit: Payer: Medicare Other | Admitting: Cardiology

## 2016-04-08 ENCOUNTER — Encounter: Payer: Self-pay | Admitting: *Deleted

## 2016-04-09 ENCOUNTER — Ambulatory Visit (INDEPENDENT_AMBULATORY_CARE_PROVIDER_SITE_OTHER): Payer: Medicare Other | Admitting: Cardiology

## 2016-04-09 ENCOUNTER — Encounter: Payer: Self-pay | Admitting: Cardiology

## 2016-04-09 VITALS — BP 148/76 | HR 67 | Ht 75.0 in | Wt 180.0 lb

## 2016-04-09 DIAGNOSIS — E782 Mixed hyperlipidemia: Secondary | ICD-10-CM

## 2016-04-09 DIAGNOSIS — I6523 Occlusion and stenosis of bilateral carotid arteries: Secondary | ICD-10-CM

## 2016-04-09 DIAGNOSIS — I2510 Atherosclerotic heart disease of native coronary artery without angina pectoris: Secondary | ICD-10-CM

## 2016-04-09 MED ORDER — ATORVASTATIN CALCIUM 80 MG PO TABS: 80.0000 mg | ORAL_TABLET | Freq: Every day | ORAL | 6 refills | Status: DC

## 2016-04-09 NOTE — Patient Instructions (Signed)

## 2016-04-09 NOTE — Progress Notes (Signed)
Clinical Summary Mr. Zerr is a 75 y.o.male seen today for follow up of the following medical problems.   1. CAD: prior stents in 2000 and 2001. Last cath 2008 w/ nonobstructive disease. Normal LVEF 65% by echo 2012. - nuclear stress 03/2015 which showed no specific ischemia.    - denies any chest pain. No SOB or DOE.  - DOE at 1/2 block which is stable. No LE edema. Has had some coughing/wheezing, cough productive with white phlegm.    2. Carotid stenosis  - prior bilateral carotid endarterectomies, recent US shows good patency. Followed by [facility]  - denies any neuro symptoms since last visit.   3. HL - 09/2015 TC 148 HDL 53 LDL 81 TG 70 - compliant with statin   4. 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 - started on spiriva. Still with symtoms at times.    Past Medical History:  Diagnosis Date  . CAD (coronary artery disease)    Normal LV function .Status post stent placement to the right coronary artery in 2000 and 2001Obstructive coronary artery disease in November 2008 by catheterization   . Carotid artery disease ([place])   . COPD (chronic obstructive pulmonary disease) ([place])   . GERD (gastroesophageal reflux disease)   . Hyperlipidemia   . Hypertension    pt denies, no meds     Allergies  Allergen Reactions  . Other Itching, Rash and Other (See Comments)    Some medication OTC he took for headache caused a rash     Current Outpatient Prescriptions  Medication Sig Dispense Refill  . aspirin 81 MG tablet Take 81 mg by mouth daily.    Marland Kitchen atorvastatin (LIPITOR) 80 MG tablet TAKE ONE TABLET BY MOUTH ONCE DAILY 90 tablet 0  . famotidine (PEPCID) 20 MG tablet Take 1 tablet by mouth daily.    . isosorbide mononitrate (IMDUR) 30 MG 24 hr tablet Take 0.5 tablets (15 mg total) by mouth daily. 45 tablet 3  . nitroGLYCERIN (NITROSTAT) 0.4 MG SL tablet Place 0.4 mg under the tongue every 5 (five) minutes as needed for chest pain.     .  predniSONE (DELTASONE) 20 MG tablet Take 2 tablet daily for 4 days then stop 8 tablet 0  . PROAIR HFA 108 (90 Base) MCG/ACT inhaler as needed.    . tiotropium (SPIRIVA HANDIHALER) 18 MCG inhalation capsule Place 1 capsule (18 mcg total) into inhaler and inhale daily. 30 capsule 12   No current facility-administered medications for this visit.      Past Surgical History:  Procedure Laterality Date  . CARDIAC CATHETERIZATION  02/2007  . cardiac stents     2 stents placed   . CAROTID ENDARTERECTOMY Bilateral 2010      (here at [facility])  CE  . FRACTURE SURGERY     right arm  . HYDROCELE EXCISION Right 04/24/2013   Procedure: HYDROCELECTOMY ADULT;  Surgeon: Hanley Ben, MD;  Location: [facility];  Service: Urology;  Laterality: Right;  . JOINT REPLACEMENT Left Nov. 3, 2015   Hip  . TOTAL HIP ARTHROPLASTY Left 02/19/2014   Procedure: TOTAL HIP ARTHROPLASTY;  Surgeon: Garald Balding, MD;  Location: [place];  Service: Orthopedics;  Laterality: Left;     Allergies  Allergen Reactions  . Other Itching, Rash and Other (See Comments)    Some medication OTC he took for headache caused a rash      Family History  Problem Relation Age of Onset  .  Heart disease Mother     Before age 51  . Heart disease Father   . Heart attack Father      Social History Mr. Tennyson reports that he has been smoking Cigarettes.  He started smoking about 62 years ago. He has a 55.00 pack-year smoking history. He has never used smokeless tobacco. Mr. Graper reports that he does not drink alcohol.   Review of Systems CONSTITUTIONAL: No weight loss, fever, chills, weakness or fatigue.  HEENT: Eyes: No visual loss, blurred vision, double vision or yellow sclerae.No hearing loss, sneezing, congestion, runny nose or sore throat.  SKIN: No rash or itching.  CARDIOVASCULAR: per hpi RESPIRATORY: No shortness of breath, cough or sputum.  GASTROINTESTINAL: No anorexia, nausea, vomiting or  diarrhea. No abdominal pain or blood.  GENITOURINARY: No burning on urination, no polyuria NEUROLOGICAL: No headache, dizziness, syncope, paralysis, ataxia, numbness or tingling in the extremities. No change in bowel or bladder control.  MUSCULOSKELETAL: No muscle, back pain, joint pain or stiffness.  LYMPHATICS: No enlarged nodes. No history of splenectomy.  PSYCHIATRIC: No history of depression or anxiety.  ENDOCRINOLOGIC: No reports of sweating, cold or heat intolerance. No polyuria or polydipsia.  Marland Kitchen   Physical Examination Vitals:   04/09/16 1555  BP: (!) 148/76  Pulse: 67   Vitals:   04/09/16 1555  Weight: 180 lb (81.6 kg)  Height: 6\' 3"  (1.905 m)    Gen: resting comfortably, no acute distress HEENT: no scleral icterus, pupils equal round and reactive, no palptable cervical adenopathy,  CV: RRR, no m/r/g, no jvd Resp: Clear to auscultation bilaterally GI: abdomen is soft, non-tender, non-distended, normal bowel sounds, no hepatosplenomegaly MSK: extremities are warm, no edema.  Skin: warm, no rash Neuro:  no focal deficits Psych: appropriate affect   Diagnostic Studies 12/12 DSE: Baseline LVEF 55-60%. Negative for ischemia.   07/2012 Carotids: patent bilaterally w/ minimal plaque/hyperplasia, vertebals normal bilateral   01/02/13 EKG: SR, normal axis, no ischemic changes  03/2015 Dobutamine nuclear stress  Defect 1: There is a small defect of moderate severity present in the basal inferoseptal, mid inferoseptal, apical septal and apical inferior location. This is most likely due to soft tissue attenuation artifact. However, myocardial scar cannot entirely be ruled out.  This is a low risk study. No ischemic territories.  Nuclear stress EF: 58%.     Assessment and Plan  1. CAD - no recent symptoms - continue current meds  2. Carotid stenosis - continue to follow with vascular  3. Hyperlipidemia - continue statin, lipids are at goal.   4.  COPD -managemnt per pcp.    F/u 6 months      Arnoldo Lenis, M.D.

## 2016-04-21 ENCOUNTER — Ambulatory Visit (INDEPENDENT_AMBULATORY_CARE_PROVIDER_SITE_OTHER): Payer: Self-pay | Admitting: Orthopaedic Surgery

## 2016-04-21 DIAGNOSIS — I252 Old myocardial infarction: Secondary | ICD-10-CM | POA: Diagnosis not present

## 2016-04-21 DIAGNOSIS — Z79899 Other long term (current) drug therapy: Secondary | ICD-10-CM | POA: Diagnosis not present

## 2016-04-21 DIAGNOSIS — F17200 Nicotine dependence, unspecified, uncomplicated: Secondary | ICD-10-CM | POA: Diagnosis not present

## 2016-04-21 DIAGNOSIS — J441 Chronic obstructive pulmonary disease with (acute) exacerbation: Secondary | ICD-10-CM | POA: Diagnosis not present

## 2016-04-21 DIAGNOSIS — K219 Gastro-esophageal reflux disease without esophagitis: Secondary | ICD-10-CM | POA: Diagnosis not present

## 2016-04-21 DIAGNOSIS — E7800 Pure hypercholesterolemia, unspecified: Secondary | ICD-10-CM | POA: Diagnosis not present

## 2016-04-21 DIAGNOSIS — R0602 Shortness of breath: Secondary | ICD-10-CM | POA: Diagnosis not present

## 2016-04-21 DIAGNOSIS — Z720 Tobacco use: Secondary | ICD-10-CM | POA: Diagnosis not present

## 2016-04-21 DIAGNOSIS — R05 Cough: Secondary | ICD-10-CM | POA: Diagnosis not present

## 2016-04-22 DIAGNOSIS — J441 Chronic obstructive pulmonary disease with (acute) exacerbation: Secondary | ICD-10-CM | POA: Diagnosis not present

## 2016-04-27 DIAGNOSIS — J441 Chronic obstructive pulmonary disease with (acute) exacerbation: Secondary | ICD-10-CM | POA: Diagnosis not present

## 2016-04-27 DIAGNOSIS — F17200 Nicotine dependence, unspecified, uncomplicated: Secondary | ICD-10-CM | POA: Diagnosis not present

## 2016-06-21 ENCOUNTER — Emergency Department (HOSPITAL_COMMUNITY)
Admission: EM | Admit: 2016-06-21 | Discharge: 2016-06-21 | Disposition: A | Discharge status: Home / Self Care | Payer: Medicare Other | Attending: Emergency Medicine | Admitting: Emergency Medicine

## 2016-06-21 ENCOUNTER — Emergency Department (HOSPITAL_COMMUNITY): Payer: Medicare Other

## 2016-06-21 ENCOUNTER — Encounter (HOSPITAL_COMMUNITY): Payer: Self-pay | Admitting: Emergency Medicine

## 2016-06-21 DIAGNOSIS — R0602 Shortness of breath: Secondary | ICD-10-CM | POA: Diagnosis not present

## 2016-06-21 DIAGNOSIS — I2510 Atherosclerotic heart disease of native coronary artery without angina pectoris: Secondary | ICD-10-CM | POA: Diagnosis not present

## 2016-06-21 DIAGNOSIS — Z7982 Long term (current) use of aspirin: Secondary | ICD-10-CM | POA: Diagnosis not present

## 2016-06-21 DIAGNOSIS — Z79899 Other long term (current) drug therapy: Secondary | ICD-10-CM | POA: Diagnosis not present

## 2016-06-21 DIAGNOSIS — F17210 Nicotine dependence, cigarettes, uncomplicated: Secondary | ICD-10-CM | POA: Diagnosis not present

## 2016-06-21 DIAGNOSIS — J441 Chronic obstructive pulmonary disease with (acute) exacerbation: Secondary | ICD-10-CM | POA: Diagnosis not present

## 2016-06-21 DIAGNOSIS — I10 Essential (primary) hypertension: Secondary | ICD-10-CM | POA: Insufficient documentation

## 2016-06-21 DIAGNOSIS — R079 Chest pain, unspecified: Secondary | ICD-10-CM | POA: Diagnosis not present

## 2016-06-21 LAB — CBC
HEMATOCRIT: 42.7 % (ref 39.0–52.0)
HEMOGLOBIN: 14.7 g/dL (ref 13.0–17.0)
MCH: 32.3 pg (ref 26.0–34.0)
MCHC: 34.4 g/dL (ref 30.0–36.0)
MCV: 93.8 fL (ref 78.0–100.0)
Platelets: 232 10*3/uL (ref 150–400)
RBC: 4.55 MIL/uL (ref 4.22–5.81)
RDW: 12.1 % (ref 11.5–15.5)
WBC: 7.4 10*3/uL (ref 4.0–10.5)

## 2016-06-21 LAB — BASIC METABOLIC PANEL
ANION GAP: 7 (ref 5–15)
BUN: 8 mg/dL (ref 6–20)
CO2: 27 mmol/L (ref 22–32)
Calcium: 8.9 mg/dL (ref 8.9–10.3)
Chloride: 101 mmol/L (ref 101–111)
Creatinine, Ser: 0.92 mg/dL (ref 0.61–1.24)
GFR calc Af Amer: >60 mL/min (ref >60)
GFR calc non Af Amer: >60 mL/min (ref >60)
GLUCOSE: 130 mg/dL — AB (ref 65–99)
Potassium: 3.7 mmol/L (ref 3.5–5.1)
Sodium: 135 mmol/L (ref 135–145)

## 2016-06-21 LAB — TROPONIN I: Troponin I: <0.03 ng/mL (ref <0.03)

## 2016-06-21 MED ORDER — PREDNISONE 20 MG PO TABS: 40.0000 mg | ORAL_TABLET | Freq: Every day | ORAL | 0 refills | Status: DC

## 2016-06-21 MED ORDER — IPRATROPIUM-ALBUTEROL 0.5-2.5 (3) MG/3ML IN SOLN
3.0000 mL | Freq: Once | RESPIRATORY_TRACT | Status: AC
  Administered 2016-06-21: 3 mL via RESPIRATORY_TRACT
  Filled 2016-06-21: qty 3

## 2016-06-21 NOTE — ED Provider Notes (Signed)
[facility] DEPT Provider Note   CSN: [id number] Arrival date & time: 06/21/16  1307   By signing my name below, I, Sonum Patel, attest that this documentation has been prepared under the direction and in the presence of Davonna Belling, MD. Electronically Signed: Sonum Patel, Education administrator. 06/21/16. 1:50 PM.  History   Chief Complaint Chief Complaint  Patient presents with  . Chest Pain  . Shortness of Breath   The history is provided by the patient. No language interpreter was used.     HPI Comments: Joseph Forbes is a 76 y.o. male who presents to the Emergency Department complaining of 1 month of intermittent left sided, non-exertional CP that lasts 2-3 min at a time. He reports associated SOB that worsened yesterday along with a cough productive of white sputum. He states nothing precipitates his pain and it usually resolves spontaneously. He has a history of cardiac issues but is unsure if this feels similar. He denies any recent injury or trauma to the affected area. He uses oxygen at home PRN; states he uses this once a day. He denies sick contacts with similar symptoms.   Past Medical History:  Diagnosis Date  . CAD (coronary artery disease)    Normal LV function .Status post stent placement to the right coronary artery in 2000 and 2001Obstructive coronary artery disease in November 2008 by catheterization   . Carotid artery disease ([place])   . COPD (chronic obstructive pulmonary disease) ([place])   . GERD (gastroesophageal reflux disease)   . Hyperlipidemia   . Hypertension    pt denies, no meds    Patient Active Problem List   Diagnosis Date Noted  . Compression fracture   . 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 Osteoarthritis of left hip 02/21/2014  . S/P total hip arthroplasty 02/19/2014  . Aftercare following surgery of the circulatory system, [place] 08/16/2013  . Occlusion and stenosis of carotid artery without mention of cerebral  infarction 07/30/2011  . DYSLIPIDEMIA 08/25/2009  . TOBACCO ABUSE 08/25/2009  . CAD (coronary artery disease), native coronary artery 08/25/2009  . PVD 08/25/2009  . CAROTID ENDARTERECTOMY, BILATERAL, HX OF 08/25/2009    Past Surgical History:  Procedure Laterality Date  . CARDIAC CATHETERIZATION  02/2007  . cardiac stents     2 stents placed   . CAROTID ENDARTERECTOMY Bilateral 2010      (here at [facility])  CE  . FRACTURE SURGERY     right arm  . HYDROCELE EXCISION Right 04/24/2013   Procedure: HYDROCELECTOMY ADULT;  Surgeon: Hanley Ben, MD;  [facility];  Service: Urology;  Laterality: Right;  . JOINT REPLACEMENT Left Nov. 3, 2015   Hip  . TOTAL HIP ARTHROPLASTY Left 02/19/2014   Procedure: TOTAL HIP ARTHROPLASTY;  Surgeon: Garald Balding, MD;  Location: [facility];  Service: Orthopedics;  Laterality: Left;       Home Medications    Prior to Admission medications   Medication Sig Start Date End Date Taking? Authorizing Provider  aspirin 81 MG tablet Take 81 mg by mouth daily.    Historical Provider, MD  atorvastatin (LIPITOR) 80 MG tablet Take 1 tablet (80 mg total) by mouth daily. 04/09/16   Arnoldo Lenis, MD  famotidine (PEPCID) 20 MG tablet Take 1 tablet by mouth daily. 04/24/15   Historical Provider, MD  isosorbide mononitrate (IMDUR) 30 MG 24 hr tablet Take 0.5 tablets (15 mg total) by mouth daily. 03/20/15   Arnoldo Lenis, MD  nitroGLYCERIN (NITROSTAT) 0.4 MG SL tablet Place 0.4 mg under the tongue every 5 (five) minutes as needed for chest pain.     Historical Provider, MD  predniSONE (DELTASONE) 20 MG tablet Take 2 tablet daily for 4 days then stop 09/09/15   Arnoldo Lenis, MD  [facility] HFA 108 [phone] Base) MCG/ACT inhaler as needed. 06/20/15   Historical Provider, MD  tiotropium (SPIRIVA HANDIHALER) 18 MCG inhalation capsule Place 1 capsule (18 mcg total) into inhaler and inhale daily. 06/24/15   Arnoldo Lenis, MD    Family History Family  History  Problem Relation Age of Onset  . Heart disease Mother     Before age 64  . Heart disease Father   . Heart attack Father     Social History Social History  Substance Use Topics  . Smoking status: Current Every Day Smoker    Packs/day: 1.00    Years: 55.00    Types: Cigarettes    Start date: 06/19/1953  . Smokeless tobacco: Never Used  . Alcohol use No     Allergies   Other   Review of Systems Review of Systems  Respiratory: Positive for cough and shortness of breath.   Cardiovascular: Positive for chest pain.  All other systems reviewed and are negative.    Physical Exam Updated Vital Signs BP 149/69 (BP Location: Left Arm)   Pulse 79   Temp 97.5 F (36.4 C) (Oral)   Resp 26   Ht 6\' 3"  (1.905 m)   Wt 185 lb (83.9 kg)   SpO2 100%   BMI 23.12 kg/m   Physical Exam  Constitutional: He is oriented to person, place, and time. He appears well-developed and well-nourished.  HENT:  Head: Normocephalic and atraumatic.  Eyes: Conjunctivae are normal.  Neck: Normal range of motion. No tracheal deviation present.  Cardiovascular: Normal rate, regular rhythm and normal heart sounds.   Pulmonary/Chest: Effort normal and breath sounds normal. No respiratory distress. He has no wheezes. He has no rales. He exhibits tenderness (tenderness to left parasternal area).  Pursed lip breathing  Abdominal: Soft. He exhibits no distension. There is no tenderness.  Musculoskeletal: He exhibits no edema or deformity.  No peripheral edema   Neurological: He is alert and oriented to person, place, and time.  Skin: Skin is warm and dry.  Psychiatric: He has a normal mood and affect.  Nursing note and vitals reviewed.   ED Treatments / Results    COORDINATION OF CARE: 1:35 PM Discussed treatment plan with pt at bedside and pt agreed to plan.   Labs (all labs ordered are listed, but only abnormal results are displayed) Labs Reviewed  BASIC METABOLIC PANEL  CBC  TROPONIN  I    EKG  EKG Interpretation  Date/Time:  Monday June 21 2016 13:09:22 EST Ventricular Rate:  78 PR Interval:  114 QRS Duration: 74 QT Interval:  720 QTC Calculation: 820 R Axis:   69 Text Interpretation:  * Poor data quality, interpretation may be adversely affected Normal sinus rhythm Nonspecific ST and T wave abnormality Abnormal ECG Confirmed by Alvino Chapel  MD, Ovid Curd [phone]) on 06/21/2016 1:21:08 PM       Radiology No results found.  Procedures Procedures (including critical care time)  Medications Ordered in ED Medications - No data to display   Initial Impression / Assessment and Plan / ED Course  I have reviewed the triage vital signs and the nursing notes.  Pertinent labs & imaging results that were available  during my care of the patient were reviewed by me and considered in my medical decision making (see chart for details).     Patient resents her shortness breath and cough. Has some dull chest pain. On THE last few weeks. Pain does not appear to be a cardiac cause. EKG reassuring and enzymes negative. Does not feel like previous coronary pain. Feels much better after breathing treatment. X-ray does not show pneumonia. Will discharge home.  Final Clinical Impressions(s) / ED Diagnoses   Final diagnoses:  None    New Prescriptions New Prescriptions   No medications on file   I personally performed the services described in this documentation, which was scribed in my presence. The recorded information has been reviewed and is accurate.      Davonna Belling, MD 06/21/16 [phone]

## 2016-06-21 NOTE — ED Triage Notes (Signed)
Pt reports intermittent shortness of breath with shooting chest pain for "a while now".  Has been recurring for several weeks. Denies pain currently.  States pain only lasts for a few minutes, but sob is not stopping this time.

## 2016-09-03 ENCOUNTER — Encounter: Payer: Self-pay | Admitting: Family

## 2016-09-14 ENCOUNTER — Ambulatory Visit (INDEPENDENT_AMBULATORY_CARE_PROVIDER_SITE_OTHER): Payer: Medicare Other | Admitting: Family

## 2016-09-14 ENCOUNTER — Encounter: Payer: Self-pay | Admitting: Family

## 2016-09-14 ENCOUNTER — Ambulatory Visit (HOSPITAL_COMMUNITY)
Admission: RE | Admit: 2016-09-14 | Discharge: 2016-09-14 | Disposition: A | Discharge status: Home / Self Care | Payer: Medicare Other | Source: Ambulatory Visit | Attending: Family | Admitting: Family

## 2016-09-14 VITALS — BP 119/71 | HR 52 | Resp 18 | Ht 75.5 in | Wt 178.3 lb

## 2016-09-14 DIAGNOSIS — Z9889 Other specified postprocedural states: Secondary | ICD-10-CM | POA: Diagnosis not present

## 2016-09-14 DIAGNOSIS — I6523 Occlusion and stenosis of bilateral carotid arteries: Secondary | ICD-10-CM | POA: Diagnosis not present

## 2016-09-14 DIAGNOSIS — F17200 Nicotine dependence, unspecified, uncomplicated: Secondary | ICD-10-CM

## 2016-09-14 LAB — VAS US CAROTID
LCCADDIAS: 17 cm/s
LCCADSYS: 104 cm/s
LEFT ECA DIAS: -10 cm/s
LEFT VERTEBRAL DIAS: -16 cm/s
LICADDIAS: -29 cm/s
LICADSYS: -105 cm/s
Left CCA prox dias: 23 cm/s
Left CCA prox sys: 136 cm/s
Left ICA prox dias: -20 cm/s
Left ICA prox sys: -108 cm/s
RCCADSYS: -80 cm/s
RCCAPDIAS: 15 cm/s
RCCAPSYS: 131 cm/s
RIGHT CCA MID DIAS: 13 cm/s
RIGHT ECA DIAS: -11 cm/s
RIGHT VERTEBRAL DIAS: -9 cm/s

## 2016-09-14 NOTE — Patient Instructions (Signed)
Stroke Prevention Some medical conditions and behaviors are associated with an increased chance of having a stroke. You may prevent a stroke by making healthy choices and managing medical conditions. How can I reduce my risk of having a stroke?  Stay physically active. Get at least 30 minutes of activity on most or all days.  Do not smoke. It may also be helpful to avoid exposure to secondhand smoke.  Limit alcohol use. Moderate alcohol use is considered to be:  No more than 2 drinks per day for men.  No more than 1 drink per day for nonpregnant women.  Eat healthy foods. This involves:  Eating 5 or more servings of fruits and vegetables a day.  Making dietary changes that address high blood pressure (hypertension), high cholesterol, diabetes, or obesity.  Manage your cholesterol levels.  Making food choices that are high in fiber and low in saturated fat, trans fat, and cholesterol may control cholesterol levels.  Take any prescribed medicines to control cholesterol as directed by your health care provider.  Manage your diabetes.  Controlling your carbohydrate and sugar intake is recommended to manage diabetes.  Take any prescribed medicines to control diabetes as directed by your health care provider.  Control your hypertension.  Making food choices that are low in salt (sodium), saturated fat, trans fat, and cholesterol is recommended to manage hypertension.  Ask your health care provider if you need treatment to lower your blood pressure. Take any prescribed medicines to control hypertension as directed by your health care provider.  If you are 18-39 years of age, have your blood pressure checked every 3-5 years. If you are 40 years of age or older, have your blood pressure checked every year.  Maintain a healthy weight.  Reducing calorie intake and making food choices that are low in sodium, saturated fat, trans fat, and cholesterol are recommended to manage  weight.  Stop drug abuse.  Avoid taking birth control pills.  Talk to your health care provider about the risks of taking birth control pills if you are over 35 years old, smoke, get migraines, or have ever had a blood clot.  Get evaluated for sleep disorders (sleep apnea).  Talk to your health care provider about getting a sleep evaluation if you snore a lot or have excessive sleepiness.  Take medicines only as directed by your health care provider.  For some people, aspirin or blood thinners (anticoagulants) are helpful in reducing the risk of forming abnormal blood clots that can lead to stroke. If you have the irregular heart rhythm of atrial fibrillation, you should be on a blood thinner unless there is a good reason you cannot take them.  Understand all your medicine instructions.  Make sure that other conditions (such as anemia or atherosclerosis) are addressed. Get help right away if:  You have sudden weakness or numbness of the face, arm, or leg, especially on one side of the body.  Your face or eyelid droops to one side.  You have sudden confusion.  You have trouble speaking (aphasia) or understanding.  You have sudden trouble seeing in one or both eyes.  You have sudden trouble walking.  You have dizziness.  You have a loss of balance or coordination.  You have a sudden, severe headache with no known cause.  You have new chest pain or an irregular heartbeat. Any of these symptoms may represent a serious problem that is an emergency. Do not wait to see if the symptoms will go away.   Get medical help at once. Call your local emergency services (911 in [place] Do not drive yourself to the hospital. This information is not intended to replace advice given to you by your health care provider. Make sure you discuss any questions you have with your health care provider. Document Released: 05/13/2004 Document Revised: 09/11/2015 Document Reviewed: 10/06/2012 Elsevier  Interactive Patient Education  2017 Elsevier Inc.     Preventing Cerebrovascular Disease Arteries are blood vessels that carry blood that contains oxygen from the heart to all parts of the body. Cerebrovascular disease affects arteries that supply the brain. Any condition that blocks or disrupts blood flow to the brain can cause cerebrovascular disease. Brain cells that lose blood supply start to die within minutes (stroke). Stroke is the main danger of cerebrovascular disease. Atherosclerosis and high blood pressure are common causes of cerebrovascular disease. Atherosclerosis is narrowing and hardening of an artery that results when fat, cholesterol, calcium, or other substances (plaque) build up inside an artery. Plaque reduces blood flow through the artery. High blood pressure increases the risk of bleeding inside the brain. Making diet and lifestyle changes to prevent atherosclerosis and high blood pressure lowers your risk of cerebrovascular disease. What nutrition changes can be made?  Eat more fruits, vegetables, and whole grains.  Reduce how much saturated fat you eat. To do this, eat less red meat and fewer full-fat dairy products.  Eat healthy proteins instead of red meat. Healthy proteins include:  Fish. Eat fish that contains heart-healthy omega-3 fatty acids, twice a week. Examples include salmon, albacore tuna, mackerel, and herring.  Chicken.  Nuts.  Low-fat or nonfat yogurt.  Avoid processed meats, like bacon and lunchmeat.  Avoid foods that contain:  A lot of sugar, such as sweets and drinks with added sugar.  A lot of salt (sodium). Avoid adding extra salt to your food, as told by your health care provider.  Trans fats, such as margarine and baked goods. Trans fats may be listed as "partially hydrogenated oils" on food labels.  Check food labels to see how much sodium, sugar, and trans fats are in foods.  Use vegetable oils that contain low amounts of  saturated fat, such as olive oil or canola oil. What lifestyle changes can be made?  Drink alcohol in moderation. This means no more than 1 drink a day for nonpregnant women and 2 drinks a day for men. One drink equals 12 oz of beer, 5 oz of wine, or 1 oz of hard liquor.  If you are overweight, ask your health care provider to recommend a weight-loss plan for you. Losing 5-10 lb (2.2-4.5 kg) can reduce your risk of diabetes, atherosclerosis, and high blood pressure.  Exercise for 30?60 minutes on most days, or as much as told by your health care provider.  Do moderate-intensity exercise, such as brisk walking, bicycling, and water aerobics. Ask your health care provider which activities are safe for you.  Do not use any products that contain nicotine or tobacco, such as cigarettes and e-cigarettes. If you need help quitting, ask your health care provider. Why are these changes important? Making these changes lowers your risk of many diseases that can cause cerebrovascular disease and stroke. Stroke is a leading cause of death and disability. Making these changes also improves your overall health and quality of life. What can I do to lower my risk? The following factors make you more likely to develop cerebrovascular disease:  Being overweight.  Smoking.  Being physically inactive.    Eating a high-fat diet.  Having certain health conditions, such as:  Diabetes.  High blood pressure.  Heart disease.  Atherosclerosis.  High cholesterol.  Sickle cell disease. Talk with your health care provider about your risk for cerebrovascular disease. Work with your health care provider to control diseases that you have that may contribute to cerebrovascular disease. Your health care provider may prescribe medicines to help prevent major causes of cerebrovascular disease. Where to find more information: Learn more about preventing cerebrovascular disease from:  National Heart, Lung, and  Blood Institute: www.nhlbi.nih.gov/health/health-topics/topics/stroke  Centers for Disease Control and Prevention: cdc.gov/stroke/about.htm Summary  Cerebrovascular disease can lead to a stroke.  Atherosclerosis and high blood pressure are major causes of cerebrovascular disease.  Making diet and lifestyle changes can reduce your risk of cerebrovascular disease.  Work with your health care provider to get your risk factors under control to reduce your risk of cerebrovascular disease. This information is not intended to replace advice given to you by your health care provider. Make sure you discuss any questions you have with your health care provider. Document Released: 04/20/2015 Document Revised: 10/24/2015 Document Reviewed: 04/20/2015 Elsevier Interactive Patient Education  2017 Elsevier Inc.     Steps to Quit Smoking Smoking tobacco can be bad for your health. It can also affect almost every organ in your body. Smoking puts you and people around you at risk for many serious long-lasting (chronic) diseases. Quitting smoking is hard, but it is one of the best things that you can do for your health. It is never too late to quit. What are the benefits of quitting smoking? When you quit smoking, you lower your risk for getting serious diseases and conditions. They can include:  Lung cancer or lung disease.  Heart disease.  Stroke.  Heart attack.  Not being able to have children (infertility).  Weak bones (osteoporosis) and broken bones (fractures). If you have coughing, wheezing, and shortness of breath, those symptoms may get better when you quit. You may also get sick less often. If you are pregnant, quitting smoking can help to lower your chances of having a baby of low birth weight. What can I do to help me quit smoking? Talk with your doctor about what can help you quit smoking. Some things you can do (strategies) include:  Quitting smoking totally, instead of slowly  cutting back how much you smoke over a period of time.  Going to in-person counseling. You are more likely to quit if you go to many counseling sessions.  Using resources and support systems, such as:  Online chats with a counselor.  Phone quitlines.  Printed self-help materials.  Support groups or group counseling.  Text messaging programs.  Mobile phone apps or applications.  Taking medicines. Some of these medicines may have nicotine in them. If you are pregnant or breastfeeding, do not take any medicines to quit smoking unless your doctor says it is okay. Talk with your doctor about counseling or other things that can help you. Talk with your doctor about using more than one strategy at the same time, such as taking medicines while you are also going to in-person counseling. This can help make quitting easier. What things can I do to make it easier to quit? Quitting smoking might feel very hard at first, but there is a lot that you can do to make it easier. Take these steps:  Talk to your family and friends. Ask them to support and encourage you.  Call phone   quitlines, reach out to support groups, or work with a counselor.  Ask people who smoke to not smoke around you.  Avoid places that make you want (trigger) to smoke, such as:  Bars.  Parties.  Smoke-break areas at work.  Spend time with people who do not smoke.  Lower the stress in your life. Stress can make you want to smoke. Try these things to help your stress:  Getting regular exercise.  Deep-breathing exercises.  Yoga.  Meditating.  Doing a body scan. To do this, close your eyes, focus on one area of your body at a time from head to toe, and notice which parts of your body are tense. Try to relax the muscles in those areas.  Download or buy apps on your mobile phone or tablet that can help you stick to your quit plan. There are many free apps, such as QuitGuide from the CDC (Centers for Disease Control  and Prevention). You can find more support from smokefree.gov and other websites. This information is not intended to replace advice given to you by your health care provider. Make sure you discuss any questions you have with your health care provider. Document Released: 01/30/2009 Document Revised: 12/02/2015 Document Reviewed: 08/20/2014 Elsevier Interactive Patient Education  2017 Elsevier Inc.  

## 2016-09-14 NOTE — Progress Notes (Signed)
Chief Complaint: Follow up Extracranial Carotid Artery Stenosis   History of Present Illness  Joseph Forbes is a 76 y.o. male patient of Dr. Kellie Simmering who is status post bilateral carotid endarterectomies in 2010. He returns today for routine surveillance.  He denies any history of TIA or stroke symptoms.Specifically he denies a history of amaurosis fugax or monocular blindness, unilateral facial drooping, hemiplegia/hemiparesis, or receptive or expressive aphasia.   Pt denies claudication symptoms in legs with walking. 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 Pt's son is with him today.  Pt Diabetic: no Pt smoker: smoker (1 ppd , started at age 5 yrs)  Pt meds include: Statin : Yes ASA: Yes Other anticoagulants/antiplatelets: no   Past Medical History:  Diagnosis Date  . CAD (coronary artery disease)    Normal LV function .Status post stent placement to the right coronary artery in 2000 and 2001Obstructive coronary artery disease in November 2008 by catheterization   . Carotid artery disease ([place])   . COPD (chronic obstructive pulmonary disease) ([place])   . GERD (gastroesophageal reflux disease)   . Hyperlipidemia   . Hypertension    pt denies, no meds    Social History Social History  Substance Use Topics  . Smoking status: Current Every Day Smoker    Packs/day: 1.00    Years: 55.00    Types: Cigarettes    Start date: 06/19/1953  . Smokeless tobacco: Never Used  . Alcohol use No    Family History Family History  Problem Relation Age of Onset  . Heart disease Mother        Before age 46  . Heart disease Father   . Heart attack Father     Surgical History Past Surgical History:  Procedure Laterality Date  . CARDIAC CATHETERIZATION  02/2007  . cardiac stents     2 stents placed   . CAROTID ENDARTERECTOMY Bilateral 2010      ([facility])  CE  . FRACTURE SURGERY     right arm  . HYDROCELE EXCISION Right 04/24/2013   Procedure:  HYDROCELECTOMY ADULT;  Surgeon: Hanley Ben, MD;  Location: [facility];  Service: Urology;  Laterality: Right;  . JOINT REPLACEMENT Left Nov. 3, 2015   Hip  . TOTAL HIP ARTHROPLASTY Left 02/19/2014   Procedure: TOTAL HIP ARTHROPLASTY;  Surgeon: Garald Balding, MD;  Location: [facility];  Service: Orthopedics;  Laterality: Left;    Allergies  Allergen Reactions  . Other Itching, Rash and Other (See Comments)    Some medication OTC he took for headache caused a rash    Current Outpatient Prescriptions  Medication Sig Dispense Refill  . aspirin 81 MG tablet Take 81 mg by mouth daily.    Marland Kitchen atorvastatin (LIPITOR) 80 MG tablet Take 1 tablet (80 mg total) by mouth daily. 30 tablet 6  . famotidine (PEPCID) 20 MG tablet Take 1 tablet by mouth daily.    . isosorbide mononitrate (IMDUR) 30 MG 24 hr tablet Take 0.5 tablets (15 mg total) by mouth daily. 45 tablet 3  . nitroGLYCERIN (NITROSTAT) 0.4 MG SL tablet Place 0.4 mg under the tongue every 5 (five) minutes as needed for chest pain.     . predniSONE (DELTASONE) 20 MG tablet Take 2 tablets (40 mg total) by mouth daily. 8 tablet 0  . PROAIR HFA 108 (90 Base) MCG/ACT inhaler 2 puffs daily as needed.     . tiotropium (SPIRIVA HANDIHALER) 18  MCG inhalation capsule Place 1 capsule (18 mcg total) into inhaler and inhale daily. 30 capsule 12   No current facility-administered medications for this visit.     Review of Systems : See HPI for pertinent positives and negatives.  Physical Examination  Vitals:   09/14/16 0952  BP: 119/71  Pulse: (!) 52  Resp: 18  SpO2: 99%  Weight: 178 lb 5 oz (80.9 kg)  Height: 6' 3.5" (1.918 m)   Body mass index is 21.99 kg/m.  General: WDWN male in NAD GAIT: normal Eyes: PERRLA Pulmonary: Respirations are non-labored, + rales, + rhonchi, & +wheezing; +chronic moist cough.  Cardiac: regular rhythm,no detected murmur.  VASCULAR EXAM  Left Right   Carotid Bruit positive  Negative  Popliteal artery Not palpable Not palpable  DP Palpable  Palpable   PT Not palpable  Not palpable    Abdominal aortic pulse is not palpable. Radial pulses are 2+ palpable and equal.     Gastrointestinal: soft, nontender, BS WNL, no r/g,no palpable masses.  Musculoskeletal: No muscle atrophy/wasting. M/S 5/5 throughout, Extremities without ischemic changes.  Neurologic: A&O X 3; Appropriate Affect, normal sensation, Speech is normal, CN 2-12 intact except some hearing loss, Pain and light touch intact in extremities, Motor exam as listed above     Assessment: Joseph Forbes is a 76 y.o. male who is status post bilateral carotid endarterectomies in 2010. He has no history of stroke or TIA.  Fortunately he does not have DM. His primary atherosclerotic risk factor remains active smoking, currently a ppd. He is not contemplating quitting.  DATA Today's carotid duplex suggests patent bilateral CEA sites with <40% restenosis.  Bilateral vertebral artery flow is antegrade.  Bilateral subclavian artery waveforms are normal.  No significant change compared to prior exam on 09-09-15.   Plan: Follow-up in 18 months with Carotid Duplex scan. The patient was counseled re smoking cessation and given several free resources re smoking cessation.   I discussed in depth with the patient the nature of atherosclerosis, and emphasized the importance of maximal medical management including strict control of blood pressure, blood glucose, and lipid levels, obtaining regular exercise, and cessation of smoking.  The patient is aware that without maximal medical management the underlying atherosclerotic disease process will progress, limiting the benefit of any interventions. The patient was given information about stroke prevention and what symptoms should  prompt the patient to seek immediate medical care. Thank you for allowing [place] to participate in this patient's care.  Clemon Chambers, RN, MSN, FNP-C [facility] Office: [facility] Physician: Early  09/14/16 10:31 AM

## 2016-09-24 NOTE — Addendum Note (Signed)
Addended by: Lianne Cure A on: 09/24/2016 01:36 PM   Modules accepted: Orders

## 2016-11-03 ENCOUNTER — Encounter (INDEPENDENT_AMBULATORY_CARE_PROVIDER_SITE_OTHER): Payer: Self-pay | Admitting: Orthopaedic Surgery

## 2016-11-03 ENCOUNTER — Ambulatory Visit (INDEPENDENT_AMBULATORY_CARE_PROVIDER_SITE_OTHER): Payer: Medicare Other | Admitting: Orthopaedic Surgery

## 2016-11-03 ENCOUNTER — Ambulatory Visit (INDEPENDENT_AMBULATORY_CARE_PROVIDER_SITE_OTHER): Payer: Self-pay

## 2016-11-03 VITALS — BP 120/80 | HR 70 | Ht 74.0 in | Wt 178.0 lb

## 2016-11-03 DIAGNOSIS — M5440 Lumbago with sciatica, unspecified side: Secondary | ICD-10-CM

## 2016-11-03 DIAGNOSIS — Z96642 Presence of left artificial hip joint: Secondary | ICD-10-CM

## 2016-11-03 MED ORDER — METHYLPREDNISOLONE 4 MG PO TABS: 4.0000 mg | ORAL_TABLET | Freq: Every day | ORAL | 0 refills | Status: DC

## 2016-11-03 NOTE — Progress Notes (Signed)
Office Visit Note   Patient: Joseph Forbes           Date of Birth: May 06, 1940           MRN: [id number] Visit Date: 11/03/2016              Requested by: Deloria Lair., MD [place], [place] [address] PCP: Deloria Lair., MD   Assessment & Plan: Visit Diagnoses:  1. Low back pain with sciatica, sciatica laterality unspecified, unspecified back pain laterality, unspecified chronicity   2. Status post left hip replacement   Left total hip replacement appears to be in good position without evidence of complication. I don't believe this is causing him his buttock and left leg pain. I do think that the arthritis at L5-S1 is creating a lot of his pain  Plan: Prednisone Dosepak. Office 1 week if no improvement and consider either MRI with MARS technique her CT scan. He is Claustrophobic.Discussed differential diagnosis of his pain and plan treatment. Office visit over 30 minutes particularly in counseling  Follow-Up Instructions: No Follow-up on file.   Orders:  Orders Placed This Encounter  Procedures  . XR Lumbar Spine 2-3 Views  . XR Pelvis 1-2 Views   Meds ordered this encounter  Medications  . methylPREDNISolone (MEDROL) 4 MG tablet    Sig: Take 1 tablet (4 mg total) by mouth daily.    Dispense:  21 tablet    Refill:  0      Procedures: No procedures performed   Clinical Data: No additional findings.   Subjective: Chief Complaint  Patient presents with  . Lower Back - Pain  Joseph Forbes noted insidious onset of low back left buttock and left leg pain approximately a month ago. He denies any history of injury or trauma. Oftentimes it feels like his left leg "gives way". He has had a prior left total hip replacement without problem. He is not specifically complaining of groin pain. He has had a prior compression fracture of T12 requiring kyphoplasty has done well from that standpoint.  HPI  Review of Systems   Objective: Vital Signs: BP 120/80    Pulse 70   Ht 6\' 2"  (1.88 m)   Wt 178 lb (80.7 kg)   BMI 22.85 kg/m   Physical Exam  Ortho Exam straight leg raise was positive at about 80 on the left side. No percussible tenderness lumbar spine. Painless range of motion of both hips. No lower extremity swelling. Neurovascular exam intact. No localized tenderness about the left knee or left hip pelvis is level  Specialty Comments:  No specialty comments available.  Imaging: Xr Lumbar Spine 2-3 Views  Result Date: 11/03/2016 AP and lateral of the lumbar spine were obtained. A prior kyphoplasty of T12 was identified.. No new compression fractures. Diffuse calcification the abdominal aorta without aneurysmal dilatation. Significant decrease in this space height between L5 and S1. No evidence of listhesis.  Xr Pelvis 1-2 Views  Result Date: 11/03/2016 AP the pelvis demonstrated a well fixed left total hip replacement in good position. Femoral head is centered about the acetabulum. There are some arthritic changes of the right femoral head and acetabulum.    PMFS History: Patient Active Problem List   Diagnosis Date Noted  . Low back pain with sciatica 11/03/2016  . Compression fracture   . Obstructive chronic bronchitis without exacerbation ([place]) 02/21/2014  . Osteoarthritis of left hip 02/21/2014  . S/P total hip arthroplasty 02/19/2014  .  Aftercare following surgery of the circulatory system, [place] 08/16/2013  . Occlusion and stenosis of carotid artery without mention of cerebral infarction 07/30/2011  . DYSLIPIDEMIA 08/25/2009  . TOBACCO ABUSE 08/25/2009  . CAD (coronary artery disease), native coronary artery 08/25/2009  . PVD 08/25/2009  . CAROTID ENDARTERECTOMY, BILATERAL, HX OF 08/25/2009   Past Medical History:  Diagnosis Date  . CAD (coronary artery disease)    Normal LV function .Status post stent placement to the right coronary artery in 2000 and 2001Obstructive coronary artery disease in November 2008 by  catheterization   . Carotid artery disease ([place])   . COPD (chronic obstructive pulmonary disease) ([place])   . GERD (gastroesophageal reflux disease)   . Hyperlipidemia   . Hypertension    pt denies, no meds    Family History  Problem Relation Age of Onset  . Heart disease Mother        Before age 42  . Heart disease Father   . Heart attack Father     Past Surgical History:  Procedure Laterality Date  . CARDIAC CATHETERIZATION  02/2007  . cardiac stents     2 stents placed   . CAROTID ENDARTERECTOMY Bilateral 2010      ([facility])  CE  . FRACTURE SURGERY     right arm  . HYDROCELE EXCISION Right 04/24/2013   Procedure: HYDROCELECTOMY ADULT;  Surgeon: Hanley Ben, MD;  [facility];  Service: Urology;  Laterality: Right;  . JOINT REPLACEMENT Left Nov. 3, 2015   Hip  . TOTAL HIP ARTHROPLASTY Left 02/19/2014   Procedure: TOTAL HIP ARTHROPLASTY;  Surgeon: Garald Balding, MD;  Location: [facility];  Service: Orthopedics;  Laterality: Left;   Social History   Occupational History  . Not on file.   Social History Main Topics  . Smoking status: Current Every Day Smoker    Packs/day: 1.00    Years: 55.00    Types: Cigarettes    Start date: 06/19/1953  . Smokeless tobacco: Current User    Types: Chew  . Alcohol use No  . Drug use: No  . Sexual activity: Not on file     Garald Balding, MD   Note - This record has been created using [employer].  Chart creation errors have been sought, but may not always  have been located. Such creation errors do not reflect on  the standard of medical care.

## 2016-11-24 ENCOUNTER — Ambulatory Visit (INDEPENDENT_AMBULATORY_CARE_PROVIDER_SITE_OTHER): Payer: Medicare Other | Admitting: Orthopaedic Surgery

## 2016-11-24 DIAGNOSIS — M5440 Lumbago with sciatica, unspecified side: Secondary | ICD-10-CM

## 2016-11-24 DIAGNOSIS — I6523 Occlusion and stenosis of bilateral carotid arteries: Secondary | ICD-10-CM | POA: Diagnosis not present

## 2016-11-24 MED ORDER — DIAZEPAM 10 MG PO TABS: 10.0000 mg | ORAL_TABLET | Freq: Once | ORAL | 0 refills | Status: AC

## 2016-11-24 NOTE — Progress Notes (Signed)
Office Visit Note   Patient: Joseph Forbes           Date of Birth: 1940-05-08           MRN: [id number] Visit Date: 11/24/2016              Requested by: Deloria Lair., MD [place], [place] [address] PCP: Deloria Lair., MD   Assessment & Plan: Visit Diagnoses: Low back pain with left lower extremity radiculopathy.  Plan: MRI scan lumbar spine.  Follow-Up Instructions: No Follow-up on file.   Orders:  No orders of the defined types were placed in this encounter.  No orders of the defined types were placed in this encounter.     Procedures: No procedures performed   Clinical Data: No additional findings.   Subjective: No chief complaint on file. Joseph Forbes relates that for several days while on the Medrol Dosepak he did quite well with complete resolution of his pain. Once the medicine was finished he began to experience recurrent pain in his low back, left buttock and is far distally as his left thigh. He's having some difficulty loss of feeling on the lateral aspect of his thigh. No changes below his knee. He denies any bowel or bladder changes. He denies any pain to his right lower extremity. He has had a prior left total hip replacement but denies any pain in the groin area no fever or chills  HPI  Review of Systems  Musculoskeletal: Positive for back pain.     Objective: Vital Signs: There were no vitals taken for this visit.  Physical Exam  Ortho Exam straight leg raise negative bilaterally. Reflexes were symmetrical. Painless range of motion of his left hip and left knee. Good strength. +1 pulses. No percussible tenderness of lumbar spine or sacroiliac joints. Skin intact.  Specialty Comments:  No specialty comments available.  Imaging: No results found.   PMFS History: Patient Active Problem List   Diagnosis Date Noted  . Low back pain with sciatica 11/03/2016  . Compression fracture   . Obstructive chronic bronchitis  without exacerbation ([place]) 02/21/2014  . Osteoarthritis of left hip 02/21/2014  . S/P total hip arthroplasty 02/19/2014  . Aftercare following surgery of the circulatory system, [place] 08/16/2013  . Occlusion and stenosis of carotid artery without mention of cerebral infarction 07/30/2011  . DYSLIPIDEMIA 08/25/2009  . TOBACCO ABUSE 08/25/2009  . CAD (coronary artery disease), native coronary artery 08/25/2009  . PVD 08/25/2009  . CAROTID ENDARTERECTOMY, BILATERAL, HX OF 08/25/2009   Past Medical History:  Diagnosis Date  . CAD (coronary artery disease)    Normal LV function .Status post stent placement to the right coronary artery in 2000 and 2001Obstructive coronary artery disease in November 2008 by catheterization   . Carotid artery disease ([place])   . COPD (chronic obstructive pulmonary disease) ([place])   . GERD (gastroesophageal reflux disease)   . Hyperlipidemia   . Hypertension    pt denies, no meds    Family History  Problem Relation Age of Onset  . Heart disease Mother        Before age 52  . Heart disease Father   . Heart attack Father     Past Surgical History:  Procedure Laterality Date  . CARDIAC CATHETERIZATION  02/2007  . cardiac stents     2 stents placed   . CAROTID ENDARTERECTOMY Bilateral 2010      (here [facility])  CE  .  FRACTURE SURGERY     right arm  . HYDROCELE EXCISION Right 04/24/2013   Procedure: HYDROCELECTOMY ADULT;  Surgeon: Hanley Ben, MD;  Location: [facility];  Service: Urology;  Laterality: Right;  . JOINT REPLACEMENT Left Nov. 3, 2015   Hip  . TOTAL HIP ARTHROPLASTY Left 02/19/2014   Procedure: TOTAL HIP ARTHROPLASTY;  Surgeon: Garald Balding, MD;  Location: [facility];  Service: Orthopedics;  Laterality: Left;   Social History   Occupational History  . Not on file.   Social History Main Topics  . Smoking status: Current Every Day Smoker    Packs/day: 1.00    Years: 55.00    Types: Cigarettes    Start date: 06/19/1953  .  Smokeless tobacco: Current User    Types: Chew  . Alcohol use No  . Drug use: No  . Sexual activity: Not on file

## 2016-12-03 ENCOUNTER — Ambulatory Visit (HOSPITAL_COMMUNITY)
Admission: RE | Admit: 2016-12-03 | Discharge: 2016-12-03 | Disposition: A | Discharge status: Home / Self Care | Payer: Medicare Other | Source: Ambulatory Visit | Attending: Orthopaedic Surgery | Admitting: Orthopaedic Surgery

## 2016-12-03 DIAGNOSIS — M545 Low back pain: Secondary | ICD-10-CM | POA: Diagnosis not present

## 2016-12-03 DIAGNOSIS — Z9889 Other specified postprocedural states: Secondary | ICD-10-CM | POA: Insufficient documentation

## 2016-12-03 DIAGNOSIS — M5440 Lumbago with sciatica, unspecified side: Secondary | ICD-10-CM

## 2016-12-03 DIAGNOSIS — M4854XA Collapsed vertebra, not elsewhere classified, thoracic region, initial encounter for fracture: Secondary | ICD-10-CM | POA: Insufficient documentation

## 2016-12-03 DIAGNOSIS — M5137 Other intervertebral disc degeneration, lumbosacral region: Secondary | ICD-10-CM | POA: Diagnosis not present

## 2016-12-08 ENCOUNTER — Encounter (INDEPENDENT_AMBULATORY_CARE_PROVIDER_SITE_OTHER): Payer: Self-pay | Admitting: Orthopedic Surgery

## 2016-12-08 ENCOUNTER — Ambulatory Visit (INDEPENDENT_AMBULATORY_CARE_PROVIDER_SITE_OTHER): Payer: Medicare Other | Admitting: Orthopedic Surgery

## 2016-12-08 VITALS — BP 113/65 | HR 85 | Ht 72.0 in | Wt 195.0 lb

## 2016-12-08 DIAGNOSIS — G8929 Other chronic pain: Secondary | ICD-10-CM

## 2016-12-08 DIAGNOSIS — M5442 Lumbago with sciatica, left side: Secondary | ICD-10-CM

## 2016-12-08 DIAGNOSIS — I6523 Occlusion and stenosis of bilateral carotid arteries: Secondary | ICD-10-CM | POA: Diagnosis not present

## 2016-12-08 NOTE — Progress Notes (Signed)
Office Visit Note   Patient: Joseph Forbes           Date of Birth: 16-Sep-1940           MRN: [id number] Visit Date: 12/08/2016              Requested by: Deloria Lair., MD [place], [place] [address] PCP: Deloria Lair., MD   Assessment & Plan: Visit Diagnoses:  1. Chronic left-sided low back pain with left-sided sciatica     Plan:  #1: We will try a course of physical therapy #2: This is not beneficial may need to consider epidural injection to the lumbar spine #3: May need to consider EMGs  Follow-Up Instructions: Return in about 2 weeks (around 12/22/2016).   Orders:  No orders of the defined types were placed in this encounter.  No orders of the defined types were placed in this encounter.     Procedures: No procedures performed   Clinical Data: No additional findings.   Subjective: Chief Complaint  Patient presents with  . Lower Back - Results    Joseph Forbes  Is a 76 y o that is here to go over MRI results of Lumbar    Joseph Forbes is seen today for evaluation of his low back pain and left leg Pain. Initially seen on 11/24/2016 with a history of low back pain and left leg Symptoms to the knee. He was placed on a Medrol Dosepak and he did quite well with complete resolution of his pain. Once the medicine was finished he began to experience recurrent pain in his low back, left buttock and as far distally to his lateral thigh but not below the knee. He's having some difficulty with numbness on the lateral aspect of his left thigh. No symptoms below his knee. He denies any bowel or bladder incontinence. He denies any pain to his right lower extremity. He has had a prior left total hip replacement but denies any pain in the groin area and no fever or chills. he has had an MRI performed and returns today for review of the MRI scan...    Review of Systems  Constitutional: Negative for fatigue.  HENT: Negative for hearing loss.   Respiratory:  Negative for apnea, chest tightness and shortness of breath.   Cardiovascular: Negative for chest pain, palpitations and leg swelling.  Gastrointestinal: Negative for blood in stool, constipation and diarrhea.  Genitourinary: Negative for difficulty urinating.  Musculoskeletal: Positive for back pain. Negative for arthralgias, joint swelling, myalgias and neck stiffness.  Neurological: Negative for weakness, numbness and headaches.  Hematological: Does not bruise/bleed easily.  Psychiatric/Behavioral: Negative for sleep disturbance. The patient is not nervous/anxious.      Objective: Vital Signs: BP 113/65   Pulse 85   Ht 6' (1.829 m)   Wt 195 lb (88.5 kg)   BMI 26.45 kg/m   Physical Exam  Constitutional: He is oriented to person, place, and time. He appears well-developed and well-nourished.  HENT:  Head: Normocephalic and atraumatic.  Eyes: Pupils are equal, round, and reactive to light. EOM are normal.  Pulmonary/Chest: Effort normal.  Neurological: He is alert and oriented to person, place, and time.  Skin: Skin is warm and dry.  Psychiatric: He has a normal mood and affect. His behavior is normal. Judgment and thought content normal.    Ortho Exam  Exam today reveals excellent strength in lower extremities. Good motion of both hips. Deep tendon reflexes  were absent on both knee and ankle jerks in both legs. Sensation is decreased on the lateral aspect of his left thigh. No symptoms below the knee.  Specialty Comments:  No specialty comments available.  Imaging: Mr Lumbar Spine W/o Contrast  Result Date: 12/03/2016 CLINICAL DATA:  Low back pain with sciatica. EXAM: MRI LUMBAR SPINE WITHOUT CONTRAST TECHNIQUE: Multiplanar, multisequence MR imaging of the lumbar spine was performed. No intravenous contrast was administered. COMPARISON:  None. FINDINGS: Segmentation:  Normal Alignment:  Normal Vertebrae: Chronic fracture T12 with cement kyphoplasty. No acute fracture or mass.  Heterogeneous bone marrow with multiple areas of fatty deposits in the bone marrow. Overall the bone marrow pattern is benign. Conus medullaris: Extends to the L1 level and appears normal. Paraspinal and other soft tissues: Negative Disc levels: T12-L1:  Negative L1-2:  Negative L2-3:  Mild disc degeneration without stenosis L2-3:  Mild disc degeneration without stenosis L4-5:  Mild disc degeneration without stenosis. L5-S1: Moderate disc degeneration with disc space narrowing disc bulging and mild endplate spurring. No significant spinal or foraminal stenosis. IMPRESSION: Chronic compression fracture T12 with cement kyphoplasty. No acute fracture or mass Lumbar degenerative changes most prominent at L5-S1. Negative for stenosis or disc protrusion. Electronically Signed   By: Franchot Gallo M.D.   On: 12/03/2016 08:35     PMFS History: Patient Active Problem List   Diagnosis Date Noted  . Low back pain with sciatica 11/03/2016  . Compression fracture   . Obstructive chronic bronchitis without exacerbation ([place]) 02/21/2014  . Osteoarthritis of left hip 02/21/2014  . S/P total hip arthroplasty 02/19/2014  . Aftercare following surgery of the circulatory system, [place] 08/16/2013  . Occlusion and stenosis of carotid artery without mention of cerebral infarction 07/30/2011  . DYSLIPIDEMIA 08/25/2009  . TOBACCO ABUSE 08/25/2009  . CAD (coronary artery disease), native coronary artery 08/25/2009  . PVD 08/25/2009  . CAROTID ENDARTERECTOMY, BILATERAL, HX OF 08/25/2009   Past Medical History:  Diagnosis Date  . CAD (coronary artery disease)    Normal LV function .Status post stent placement to the right coronary artery in 2000 and 2001Obstructive coronary artery disease in November 2008 by catheterization   . Carotid artery disease ([place])   . COPD (chronic obstructive pulmonary disease) ([place])   . GERD (gastroesophageal reflux disease)   . Hyperlipidemia   . Hypertension    pt denies, no meds      Family History  Problem Relation Age of Onset  . Heart disease Mother        Before age 17  . Heart disease Father   . Heart attack Father     Past Surgical History:  Procedure Laterality Date  . CARDIAC CATHETERIZATION  02/2007  . cardiac stents     2 stents placed   . CAROTID ENDARTERECTOMY Bilateral 2010      (here [facility])  CE  . FRACTURE SURGERY     right arm  . HYDROCELE EXCISION Right 04/24/2013   Procedure: HYDROCELECTOMY ADULT;  Surgeon: Hanley Ben, MD;  Location: [facility];  Service: Urology;  Laterality: Right;  . JOINT REPLACEMENT Left Nov. 3, 2015   Hip  . TOTAL HIP ARTHROPLASTY Left 02/19/2014   Procedure: TOTAL HIP ARTHROPLASTY;  Surgeon: Garald Balding, MD;  Location: [facility];  Service: Orthopedics;  Laterality: Left;   Social History   Occupational History  . Not on file.   Social History Main Topics  . Smoking status: Current Every Day Smoker  Packs/day: 1.00    Years: 55.00    Types: Cigarettes    Start date: 06/19/1953  . Smokeless tobacco: Current User    Types: Chew  . Alcohol use No  . Drug use: No  . Sexual activity: Not on file

## 2016-12-09 DIAGNOSIS — M25552 Pain in left hip: Secondary | ICD-10-CM | POA: Diagnosis not present

## 2016-12-09 DIAGNOSIS — M5489 Other dorsalgia: Secondary | ICD-10-CM | POA: Diagnosis not present

## 2016-12-09 DIAGNOSIS — M5442 Lumbago with sciatica, left side: Secondary | ICD-10-CM | POA: Diagnosis not present

## 2016-12-09 DIAGNOSIS — R2689 Other abnormalities of gait and mobility: Secondary | ICD-10-CM | POA: Diagnosis not present

## 2016-12-16 DIAGNOSIS — R2689 Other abnormalities of gait and mobility: Secondary | ICD-10-CM | POA: Diagnosis not present

## 2016-12-16 DIAGNOSIS — M5442 Lumbago with sciatica, left side: Secondary | ICD-10-CM | POA: Diagnosis not present

## 2016-12-16 DIAGNOSIS — M25552 Pain in left hip: Secondary | ICD-10-CM | POA: Diagnosis not present

## 2016-12-16 DIAGNOSIS — M5489 Other dorsalgia: Secondary | ICD-10-CM | POA: Diagnosis not present

## 2017-01-10 ENCOUNTER — Other Ambulatory Visit: Payer: Self-pay

## 2017-01-10 MED ORDER — ATORVASTATIN CALCIUM 80 MG PO TABS: 80.0000 mg | ORAL_TABLET | Freq: Every day | ORAL | 0 refills | Status: DC

## 2017-01-24 DIAGNOSIS — Z720 Tobacco use: Secondary | ICD-10-CM | POA: Diagnosis not present

## 2017-01-24 DIAGNOSIS — Z6822 Body mass index (BMI) 22.0-22.9, adult: Secondary | ICD-10-CM | POA: Diagnosis not present

## 2017-01-24 DIAGNOSIS — J441 Chronic obstructive pulmonary disease with (acute) exacerbation: Secondary | ICD-10-CM | POA: Diagnosis not present

## 2017-02-26 DIAGNOSIS — Z23 Encounter for immunization: Secondary | ICD-10-CM | POA: Diagnosis not present

## 2017-03-04 ENCOUNTER — Other Ambulatory Visit: Payer: Self-pay | Admitting: Cardiology

## 2017-04-15 DIAGNOSIS — J449 Chronic obstructive pulmonary disease, unspecified: Secondary | ICD-10-CM | POA: Diagnosis not present

## 2017-04-15 DIAGNOSIS — Z720 Tobacco use: Secondary | ICD-10-CM | POA: Diagnosis not present

## 2017-04-15 DIAGNOSIS — I2510 Atherosclerotic heart disease of native coronary artery without angina pectoris: Secondary | ICD-10-CM | POA: Diagnosis not present

## 2017-04-15 DIAGNOSIS — Z6822 Body mass index (BMI) 22.0-22.9, adult: Secondary | ICD-10-CM | POA: Diagnosis not present

## 2017-04-26 DIAGNOSIS — Z6822 Body mass index (BMI) 22.0-22.9, adult: Secondary | ICD-10-CM | POA: Diagnosis not present

## 2017-04-26 DIAGNOSIS — K921 Melena: Secondary | ICD-10-CM | POA: Diagnosis not present

## 2017-04-26 DIAGNOSIS — J449 Chronic obstructive pulmonary disease, unspecified: Secondary | ICD-10-CM | POA: Diagnosis not present

## 2017-04-26 DIAGNOSIS — Z720 Tobacco use: Secondary | ICD-10-CM | POA: Diagnosis not present

## 2017-05-02 ENCOUNTER — Other Ambulatory Visit (INDEPENDENT_AMBULATORY_CARE_PROVIDER_SITE_OTHER): Payer: Self-pay | Admitting: Internal Medicine

## 2017-05-05 ENCOUNTER — Emergency Department (HOSPITAL_COMMUNITY): Payer: Medicare Other

## 2017-05-05 ENCOUNTER — Encounter (HOSPITAL_COMMUNITY): Payer: Self-pay | Admitting: Emergency Medicine

## 2017-05-05 ENCOUNTER — Other Ambulatory Visit: Payer: Self-pay

## 2017-05-05 ENCOUNTER — Emergency Department (HOSPITAL_COMMUNITY)
Admission: EM | Admit: 2017-05-05 | Discharge: 2017-05-05 | Disposition: A | Discharge status: Home / Self Care | Payer: Medicare Other | Attending: Emergency Medicine | Admitting: Emergency Medicine

## 2017-05-05 DIAGNOSIS — Z79899 Other long term (current) drug therapy: Secondary | ICD-10-CM | POA: Diagnosis not present

## 2017-05-05 DIAGNOSIS — R1084 Generalized abdominal pain: Secondary | ICD-10-CM | POA: Diagnosis not present

## 2017-05-05 DIAGNOSIS — K921 Melena: Secondary | ICD-10-CM | POA: Diagnosis not present

## 2017-05-05 DIAGNOSIS — F17210 Nicotine dependence, cigarettes, uncomplicated: Secondary | ICD-10-CM | POA: Diagnosis not present

## 2017-05-05 DIAGNOSIS — K5730 Diverticulosis of large intestine without perforation or abscess without bleeding: Secondary | ICD-10-CM | POA: Diagnosis not present

## 2017-05-05 DIAGNOSIS — J449 Chronic obstructive pulmonary disease, unspecified: Secondary | ICD-10-CM | POA: Insufficient documentation

## 2017-05-05 DIAGNOSIS — K6289 Other specified diseases of anus and rectum: Secondary | ICD-10-CM | POA: Insufficient documentation

## 2017-05-05 DIAGNOSIS — I10 Essential (primary) hypertension: Secondary | ICD-10-CM | POA: Insufficient documentation

## 2017-05-05 DIAGNOSIS — I2510 Atherosclerotic heart disease of native coronary artery without angina pectoris: Secondary | ICD-10-CM | POA: Insufficient documentation

## 2017-05-05 DIAGNOSIS — Z7982 Long term (current) use of aspirin: Secondary | ICD-10-CM | POA: Diagnosis not present

## 2017-05-05 DIAGNOSIS — R195 Other fecal abnormalities: Secondary | ICD-10-CM | POA: Diagnosis present

## 2017-05-05 LAB — COMPREHENSIVE METABOLIC PANEL
ALK PHOS: 97 U/L (ref 38–126)
ALT: 16 U/L — AB (ref 17–63)
ANION GAP: 8 (ref 5–15)
AST: 23 U/L (ref 15–41)
Albumin: 4 g/dL (ref 3.5–5.0)
BILIRUBIN TOTAL: 0.7 mg/dL (ref 0.3–1.2)
BUN: 12 mg/dL (ref 6–20)
CALCIUM: 9.7 mg/dL (ref 8.9–10.3)
CO2: 28 mmol/L (ref 22–32)
CREATININE: 1.02 mg/dL (ref 0.61–1.24)
Chloride: 101 mmol/L (ref 101–111)
GFR calc non Af Amer: >60 mL/min (ref >60)
GLUCOSE: 107 mg/dL — AB (ref 65–99)
Potassium: 4.5 mmol/L (ref 3.5–5.1)
Sodium: 137 mmol/L (ref 135–145)
TOTAL PROTEIN: 7.1 g/dL (ref 6.5–8.1)

## 2017-05-05 LAB — POC OCCULT BLOOD, ED: Fecal Occult Bld: NEGATIVE

## 2017-05-05 LAB — CBC
HCT: 47.8 % (ref 39.0–52.0)
HEMOGLOBIN: 15.9 g/dL (ref 13.0–17.0)
MCH: 31.5 pg (ref 26.0–34.0)
MCHC: 33.3 g/dL (ref 30.0–36.0)
MCV: 94.7 fL (ref 78.0–100.0)
PLATELETS: 239 10*3/uL (ref 150–400)
RBC: 5.05 MIL/uL (ref 4.22–5.81)
RDW: 12 % (ref 11.5–15.5)
WBC: 6.4 10*3/uL (ref 4.0–10.5)

## 2017-05-05 LAB — TYPE AND SCREEN
ABO/RH(D): O POS
ANTIBODY SCREEN: NEGATIVE

## 2017-05-05 MED ORDER — CIPROFLOXACIN HCL 500 MG PO TABS: 500.0000 mg | ORAL_TABLET | Freq: Two times a day (BID) | ORAL | 0 refills | Status: DC

## 2017-05-05 MED ORDER — METRONIDAZOLE 500 MG PO TABS: 500.0000 mg | ORAL_TABLET | Freq: Two times a day (BID) | ORAL | 0 refills | Status: DC

## 2017-05-05 MED ORDER — IOPAMIDOL (ISOVUE-300) INJECTION 61%
100.0000 mL | Freq: Once | INTRAVENOUS | Status: AC | PRN
  Administered 2017-05-05: 100 mL via INTRAVENOUS

## 2017-05-05 NOTE — ED Notes (Signed)
Pt in ct 

## 2017-05-05 NOTE — ED Triage Notes (Signed)
Patient complains of black tarry stools x 3 weeks.

## 2017-05-05 NOTE — ED Provider Notes (Signed)
[facility] EMERGENCY DEPARTMENT Provider Note   CSN: [id number] Arrival date & time: 05/05/17  1001     History   Chief Complaint Chief Complaint  Patient presents with  . Blood In Stools    HPI DOM HAVERLAND is a 77 y.o. male.  HPI  Pt was seen at 1210. Per pt, c/o gradual onset and improvement of constant "black stools" for the past 1 month. Pt states "they started to clear up" last week, and his BM today was "normal." Has been associated with generalized abd "pain." Pt was evaluated by his PMD and referred to GI MD, but has not scheduled an appointment yet. Denies N/V/D, no back pain, no CP/SOB, no fevers, no red blood in stools.   Past Medical History:  Diagnosis Date  . CAD (coronary artery disease)    Normal LV function .Status post stent placement to the right coronary artery in 2000 and 2001Obstructive coronary artery disease in November 2008 by catheterization   . Carotid artery disease ([place])   . COPD (chronic obstructive pulmonary disease) ([place])   . GERD (gastroesophageal reflux disease)   . Hyperlipidemia   . Hypertension    pt denies, no meds    Patient Active Problem List   Diagnosis Date Noted  . Low back pain with sciatica 11/03/2016  . Compression fracture   . Obstructive chronic bronchitis without exacerbation ([place]) 02/21/2014  . Osteoarthritis of left hip 02/21/2014  . S/P total hip arthroplasty 02/19/2014  . Aftercare following surgery of the circulatory system, [place] 08/16/2013  . Occlusion and stenosis of carotid artery without mention of cerebral infarction 07/30/2011  . DYSLIPIDEMIA 08/25/2009  . TOBACCO ABUSE 08/25/2009  . CAD (coronary artery disease), native coronary artery 08/25/2009  . PVD 08/25/2009  . CAROTID ENDARTERECTOMY, BILATERAL, HX OF 08/25/2009    Past Surgical History:  Procedure Laterality Date  . CARDIAC CATHETERIZATION  02/2007  . cardiac stents     2 stents placed   . CAROTID ENDARTERECTOMY Bilateral 2010      (here  [facility])  CE  . FRACTURE SURGERY     right arm  . HYDROCELE EXCISION Right 04/24/2013   Procedure: HYDROCELECTOMY ADULT;  Surgeon: Hanley Ben, MD;  Location: [facility];  Service: Urology;  Laterality: Right;  . JOINT REPLACEMENT Left Nov. 3, 2015   Hip  . TOTAL HIP ARTHROPLASTY Left 02/19/2014   Procedure: TOTAL HIP ARTHROPLASTY;  Surgeon: Garald Balding, MD;  Location: [facility];  Service: Orthopedics;  Laterality: Left;       Home Medications    Prior to Admission medications   Medication Sig Start Date End Date Taking? Authorizing Provider  aspirin 81 MG tablet Take 81 mg by mouth daily.    [provider]  atorvastatin (LIPITOR) 80 MG tablet TAKE ONE TABLET BY MOUTH DAILY 03/04/17   Arnoldo Lenis, MD  famotidine (PEPCID) 20 MG tablet Take 1 tablet by mouth daily. 04/24/15   [provider]  isosorbide mononitrate (IMDUR) 30 MG 24 hr tablet Take 0.5 tablets (15 mg total) by mouth daily. 03/20/15   Arnoldo Lenis, MD  lisinopril (PRINIVIL,ZESTRIL) 10 MG tablet Take 10 mg by mouth daily.    [provider]  methylPREDNISolone (MEDROL) 4 MG tablet Take 1 tablet (4 mg total) by mouth daily. 11/03/16   Garald Balding, MD  nitroGLYCERIN (NITROSTAT) 0.4 MG SL tablet Place 0.4 mg under the tongue every 5 (five) minutes as needed for chest pain.  [provider]  predniSONE (DELTASONE) 20 MG tablet Take 2 tablets (40 mg total) by mouth daily. Patient not taking: Reported on 11/03/2016 06/21/16   Davonna Belling, MD  [facility] HFA [id number] [phone] Base) MCG/ACT inhaler 2 puffs daily as needed.  06/20/15   [provider]  tiotropium (SPIRIVA HANDIHALER) 18 MCG inhalation capsule Place 1 capsule (18 mcg total) into inhaler and inhale daily. 06/24/15   Arnoldo Lenis, MD    Family History Family History  Problem Relation Age of Onset  . Heart disease Mother        Before age 83  . Heart disease Father   . Heart attack Father       Social History Social History   Tobacco Use  . Smoking status: Current Every Day Smoker    Packs/day: 1.00    Years: 55.00    Pack years: 55.00    Types: Cigarettes    Start date: 06/19/1953  . Smokeless tobacco: Current User    Types: Chew  Substance Use Topics  . Alcohol use: No    Alcohol/week: 0.0 oz  . Drug use: No     Allergies   Other   Review of Systems Review of Systems ROS: Statement: All systems negative except as marked or noted in the HPI; Constitutional: Negative for fever and chills. ; ; Eyes: Negative for eye pain, redness and discharge. ; ; ENMT: Negative for ear pain, hoarseness, nasal congestion, sinus pressure and sore throat. ; ; Cardiovascular: Negative for chest pain, palpitations, diaphoresis, dyspnea and peripheral edema. ; ; Respiratory: Negative for cough, wheezing and stridor. ; ; Gastrointestinal: +abd pain, black stools. Negative for nausea, vomiting, diarrhea, blood in stool, hematemesis, jaundice and rectal bleeding. . ; ; Genitourinary: Negative for dysuria, flank pain and hematuria. ; ; Musculoskeletal: Negative for back pain and neck pain. Negative for swelling and trauma.; ; Skin: Negative for pruritus, rash, abrasions, blisters, bruising and skin lesion.; ; Neuro: Negative for headache, lightheadedness and neck stiffness. Negative for weakness, altered level of consciousness, altered mental status, extremity weakness, paresthesias, involuntary movement, seizure and syncope.       Physical Exam Updated Vital Signs BP (!) 142/81 (BP Location: Right Arm)   Pulse 91   Temp 97.7 F (36.5 C) (Oral)   Resp 18   Ht 6\' 3"  (1.905 m)   Wt 81.2 kg (179 lb)   SpO2 95%   BMI 22.37 kg/m   11:53:26 Orthostatic Vital Signs CE  Orthostatic Lying   BP- Lying: 138/69  Pulse- Lying: 70      Orthostatic Sitting  BP- Sitting: 144/73  Pulse- Sitting: 80      Orthostatic Standing at 0 minutes  BP- Standing at 0 minutes: 144/82  Pulse- Standing  at 0 minutes: 96     Physical Exam 1215: Physical examination:  Nursing notes reviewed; Vital signs and O2 SAT reviewed;  Constitutional: Well developed, Well nourished, Well hydrated, In no acute distress; Head:  Normocephalic, atraumatic; Eyes: EOMI, PERRL, No scleral icterus; ENMT: Mouth and pharynx normal, Mucous membranes moist; Neck: Supple, Full range of motion, No lymphadenopathy; Cardiovascular: Regular rate and rhythm, No gallop; Respiratory: Breath sounds clear & equal bilaterally, No wheezes.  Speaking full sentences with ease, Normal respiratory effort/excursion; Chest: Nontender, Movement normal; Abdomen: Soft, +mild diffuse tenderness. No rebound or guarding. Nondistended, Normal bowel sounds. Rectal exam performed w/permission of pt and ED RN chaperone present.  Anal tone normal.  Non-tender, soft brown stool in rectal vault,  heme neg.  No fissures, no external hemorrhoids, no palp masses.; Genitourinary: No CVA tenderness; Extremities: Pulses normal, No tenderness, No edema, No calf edema or asymmetry.; Neuro: AA&Ox3, +HOH, otherwise major CN grossly intact.  Speech clear. No gross focal motor or sensory deficits in extremities.; Skin: Color normal, Warm, Dry.   ED Treatments / Results  Labs (all labs ordered are listed, but only abnormal results are displayed)   EKG  EKG Interpretation None       Radiology   Procedures Procedures (including critical care time)  Medications Ordered in ED Medications - No data to display   Initial Impression / Assessment and Plan / ED Course  I have reviewed the triage vital signs and the nursing notes.  Pertinent labs & imaging results that were available during my care of the patient were reviewed by me and considered in my medical decision making (see chart for details).  MDM Reviewed: previous chart, nursing note and vitals Reviewed previous: labs Interpretation: labs and CT scan   Results for orders placed or performed  during the hospital encounter of 05/05/17  Comprehensive metabolic panel  Result Value Ref Range   Sodium 137 135 - 145 mmol/L   Potassium 4.5 3.5 - 5.1 mmol/L   Chloride 101 101 - 111 mmol/L   CO2 28 22 - 32 mmol/L   Glucose, Bld 107 (H) 65 - 99 mg/dL   BUN 12 6 - 20 mg/dL   Creatinine, Ser 1.02 0.61 - 1.24 mg/dL   Calcium 9.7 8.9 - 10.3 mg/dL   Total Protein 7.1 6.5 - 8.1 g/dL   Albumin 4.0 3.5 - 5.0 g/dL   AST 23 15 - 41 U/L   ALT 16 (L) 17 - 63 U/L   Alkaline Phosphatase 97 38 - 126 U/L   Total Bilirubin 0.7 0.3 - 1.2 mg/dL   GFR calc non Af Amer >60 >60 mL/min   GFR calc Af Amer >60 >60 mL/min   Anion gap 8 5 - 15  CBC  Result Value Ref Range   WBC 6.4 4.0 - 10.5 K/uL   RBC 5.05 4.22 - 5.81 MIL/uL   Hemoglobin 15.9 13.0 - 17.0 g/dL   HCT 47.8 39.0 - 52.0 %   MCV 94.7 78.0 - 100.0 fL   MCH 31.5 26.0 - 34.0 pg   MCHC 33.3 30.0 - 36.0 g/dL   RDW 12.0 11.5 - 15.5 %   Platelets 239 150 - 400 K/uL  POC occult blood, ED  Result Value Ref Range   Fecal Occult Bld NEGATIVE NEGATIVE  Type and screen [facility]  Result Value Ref Range   ABO/RH(D) O POS    Antibody Screen NEG    Sample Expiration 05/08/2017    Ct Abdomen Pelvis W Contrast Result Date: 05/05/2017 CLINICAL DATA:  Left abdomen pain with tarry stools for 3 weeks. EXAM: CT ABDOMEN AND PELVIS WITH CONTRAST TECHNIQUE: Multidetector CT imaging of the abdomen and pelvis was performed using the standard protocol following bolus administration of intravenous contrast. CONTRAST:  158mL ISOVUE-300 IOPAMIDOL (ISOVUE-300) INJECTION 61% COMPARISON:  None. FINDINGS: Lower chest: No acute abnormality. Hepatobiliary: No focal liver abnormality is seen. A 1 mm calcified granuloma is identified in the right lobe liver. No gallstones, gallbladder wall thickening, or biliary dilatation. Pancreas: Unremarkable. No pancreatic ductal dilatation or surrounding inflammatory changes. Spleen: Normal in size without focal abnormality.  Adrenals/Urinary Tract: The adrenal glands are normal. Simple cysts are identified in the left breast. No hydronephrosis is  identified bilaterally. The bladder is normal. Stomach/Bowel: There is a small hiatal hernia. The stomach is otherwise normal. There is no small bowel obstruction. There is diverticulosis of colon without diverticulitis. There is generalized diffuse thickening of the distal rectum. The appendix is normal. Vascular/Lymphatic: Aortic atherosclerosis. No enlarged abdominal or pelvic lymph nodes. Reproductive: Prostate gland is enlarged. Other: No abdominal wall hernia or abnormality. No abdominopelvic ascites. Musculoskeletal: Degenerative joint changes of the spine are noted. Vertebroplasty of T12 is noted. IMPRESSION: Diverticulosis of colon without diverticulitis. No bowel obstruction. Generalized diffuse thickening of the distal rectum, nonspecific. Electronically Signed   By: Abelardo Diesel M.D.   On: 05/05/2017 14:02    1530:  H/H stable. Pt not orthostatic. Stool is heme negative. CT as above; will tx for proctitis. Pt has tol PO well while in the ED without N/V. No stooling while in the ED. Abd benign, VSS. Pt states he wants to go home now. Dx and testing d/w pt.  Questions answered.  Verb understanding, agreeable to d/c home with outpt f/u.   Final Clinical Impressions(s) / ED Diagnoses   Final diagnoses:  None    ED Discharge Orders    None       Francine Graven, DO 05/08/17 1328

## 2017-05-05 NOTE — Discharge Instructions (Signed)
Take the prescriptions as directed.  Call your regular medical doctor today to schedule a follow up appointment within the next 3 days. Call your GI doctor today to schedule a follow up appointment within the next week.  Return to the Emergency Department immediately sooner if worsening.  ° °

## 2017-05-18 ENCOUNTER — Encounter (INDEPENDENT_AMBULATORY_CARE_PROVIDER_SITE_OTHER): Payer: Self-pay | Admitting: *Deleted

## 2017-05-18 ENCOUNTER — Encounter (INDEPENDENT_AMBULATORY_CARE_PROVIDER_SITE_OTHER): Payer: Self-pay | Admitting: Internal Medicine

## 2017-05-18 ENCOUNTER — Ambulatory Visit (INDEPENDENT_AMBULATORY_CARE_PROVIDER_SITE_OTHER): Payer: Medicare Other | Admitting: Internal Medicine

## 2017-05-18 VITALS — BP 150/82 | HR 64 | Temp 97.7°F | Ht 75.0 in | Wt 175.7 lb

## 2017-05-18 DIAGNOSIS — R935 Abnormal findings on diagnostic imaging of other abdominal regions, including retroperitoneum: Secondary | ICD-10-CM | POA: Insufficient documentation

## 2017-05-18 DIAGNOSIS — K921 Melena: Secondary | ICD-10-CM | POA: Insufficient documentation

## 2017-05-18 NOTE — Progress Notes (Signed)
Subjective:    Patient ID: Joseph Forbes, male    DOB: January 07, 1941, 77 y.o.   MRN: [id number]  HPI Referred by DR. Tapper for melena. Seen in the ED at [facility] with c/o black stools for the past month. Seen in the ED 05/05/2017. His stools had been clear for about a week before being seen in the ED. He had had black stools for 2-3 weeks. Had slight rt upper quadrant pain but known in 2-3 days.  Had not been taking any NSAIDs except a baby ASA. Hemoglobin was stable in the ED. His stool was guaiac negative. No N,V,D. He has never had a colonoscopy or EGD.   He has some acid reflux.  His weight is stable.  No change in his stools.  Has a BM daily or every other day. No family hx of colon cancer Smokes a pack of cigarettes a day  CT of the abdomen on 05/05/2017 revealed: IMPRESSION: Diverticulosis of colon without diverticulitis. No bowel obstruction. Generalized diffuse thickening of the distal rectum, nonspecific.   Hx of MI years ago, CAD, Carotid endarterectomy, total hip 6 cardiac stents CBC    Component Value Date/Time   WBC 6.4 05/05/2017 1056   RBC 5.05 05/05/2017 1056   HGB 15.9 05/05/2017 1056   HCT 47.8 05/05/2017 1056   PLT 239 05/05/2017 1056   MCV 94.7 05/05/2017 1056   MCH 31.5 05/05/2017 1056   MCHC 33.3 05/05/2017 1056   RDW 12.0 05/05/2017 1056   LYMPHSABS 1.5 11/15/2014 0930   MONOABS 0.6 11/15/2014 0930   EOSABS 0.1 11/15/2014 0930   BASOSABS 0.0 11/15/2014 0930       Review of Systems Past Medical History:  Diagnosis Date  . CAD (coronary artery disease)    Normal LV function .Status post stent placement to the right coronary artery in 2000 and 2001Obstructive coronary artery disease in November 2008 by catheterization   . Carotid artery disease ([place])   . COPD (chronic obstructive pulmonary disease) ([place])   . GERD (gastroesophageal reflux disease)   . Hyperlipidemia   . Hypertension    pt denies, no meds    Past Surgical History:  Procedure  Laterality Date  . CARDIAC CATHETERIZATION  02/2007  . cardiac stents     2 stents placed   . CAROTID ENDARTERECTOMY Bilateral 2010      (here at [facility])  CE  . FRACTURE SURGERY     right arm  . HYDROCELE EXCISION Right 04/24/2013   Procedure: HYDROCELECTOMY ADULT;  Surgeon: Hanley Ben, MD;  Location: [facility];  Service: Urology;  Laterality: Right;  . JOINT REPLACEMENT Left Nov. 3, 2015   Hip  . TOTAL HIP ARTHROPLASTY Left 02/19/2014   Procedure: TOTAL HIP ARTHROPLASTY;  Surgeon: Garald Balding, MD;  Location: [facility];  Service: Orthopedics;  Laterality: Left;    Allergies  Allergen Reactions  . Clarithromycin Nausea Only  . Other Itching, Rash and Other (See Comments)    Some medication OTC he took for headache caused a rash    Current Outpatient Medications on File Prior to Visit  Medication Sig Dispense Refill  . aspirin 81 MG tablet Take 81 mg by mouth daily.    Marland Kitchen atorvastatin (LIPITOR) 80 MG tablet TAKE ONE TABLET BY MOUTH DAILY 15 tablet 0  . famotidine (PEPCID) 20 MG tablet Take 1 tablet by mouth daily.    . nitroGLYCERIN (NITROSTAT) 0.4 MG SL tablet Place 0.4 mg under the tongue every 5 (  five) minutes as needed for chest pain.     Marland Kitchen PROAIR HFA 108 (90 Base) MCG/ACT inhaler 2 puffs daily as needed.      No current facility-administered medications on file prior to visit.         Objective:   Physical Exam Blood pressure (!) 150/82, pulse 64, temperature 97.7 F (36.5 C), height 6\' 3"  (1.905 m), weight 175 lb 11.2 oz (79.7 kg). Alert and oriented. Skin warm and dry. Oral mucosa is moist.   . Sclera anicteric, conjunctivae is pink. Thyroid not enlarged. No cervical lymphadenopathy. Bilateral wheezes Heart regular rate and rhythm.  Abdomen is soft. Bowel sounds are positive. No hepatomegaly. No abdominal masses felt. No tenderness.  No edema to lower extremities.           Assessment & Plan:  Melena: resolved. PUD needs to be ruled out. His  hemoglobin is stable. Screening colonoscopy: He has never undergone a colonoscopy. Abnormal CT.

## 2017-05-18 NOTE — Patient Instructions (Signed)
EGD/Colonoscopy. The risks of bleeding, perforation and infection were reviewed with patient.  

## 2017-05-23 ENCOUNTER — Ambulatory Visit (HOSPITAL_COMMUNITY)
Admission: RE | Admit: 2017-05-23 | Discharge: 2017-05-23 | Disposition: A | Discharge status: Home / Self Care | Payer: Medicare Other | Source: Ambulatory Visit | Attending: Internal Medicine | Admitting: Internal Medicine

## 2017-05-23 ENCOUNTER — Encounter (HOSPITAL_COMMUNITY)
Admission: RE | Disposition: A | Discharge status: Home / Self Care | Payer: Self-pay | Source: Ambulatory Visit | Attending: Internal Medicine

## 2017-05-23 ENCOUNTER — Other Ambulatory Visit: Payer: Self-pay

## 2017-05-23 ENCOUNTER — Encounter (HOSPITAL_COMMUNITY): Payer: Self-pay | Admitting: *Deleted

## 2017-05-23 DIAGNOSIS — R935 Abnormal findings on diagnostic imaging of other abdominal regions, including retroperitoneum: Secondary | ICD-10-CM

## 2017-05-23 DIAGNOSIS — K5520 Angiodysplasia of colon without hemorrhage: Secondary | ICD-10-CM | POA: Diagnosis not present

## 2017-05-23 DIAGNOSIS — R933 Abnormal findings on diagnostic imaging of other parts of digestive tract: Secondary | ICD-10-CM | POA: Diagnosis not present

## 2017-05-23 DIAGNOSIS — K921 Melena: Secondary | ICD-10-CM | POA: Diagnosis not present

## 2017-05-23 DIAGNOSIS — F17210 Nicotine dependence, cigarettes, uncomplicated: Secondary | ICD-10-CM | POA: Insufficient documentation

## 2017-05-23 DIAGNOSIS — Z955 Presence of coronary angioplasty implant and graft: Secondary | ICD-10-CM | POA: Diagnosis not present

## 2017-05-23 DIAGNOSIS — K210 Gastro-esophageal reflux disease with esophagitis: Secondary | ICD-10-CM | POA: Insufficient documentation

## 2017-05-23 DIAGNOSIS — I10 Essential (primary) hypertension: Secondary | ICD-10-CM | POA: Insufficient documentation

## 2017-05-23 DIAGNOSIS — K644 Residual hemorrhoidal skin tags: Secondary | ICD-10-CM | POA: Diagnosis not present

## 2017-05-23 DIAGNOSIS — J449 Chronic obstructive pulmonary disease, unspecified: Secondary | ICD-10-CM | POA: Insufficient documentation

## 2017-05-23 DIAGNOSIS — Z79899 Other long term (current) drug therapy: Secondary | ICD-10-CM | POA: Diagnosis not present

## 2017-05-23 DIAGNOSIS — D122 Benign neoplasm of ascending colon: Secondary | ICD-10-CM | POA: Insufficient documentation

## 2017-05-23 DIAGNOSIS — K5730 Diverticulosis of large intestine without perforation or abscess without bleeding: Secondary | ICD-10-CM | POA: Insufficient documentation

## 2017-05-23 DIAGNOSIS — K449 Diaphragmatic hernia without obstruction or gangrene: Secondary | ICD-10-CM | POA: Insufficient documentation

## 2017-05-23 DIAGNOSIS — I2510 Atherosclerotic heart disease of native coronary artery without angina pectoris: Secondary | ICD-10-CM | POA: Insufficient documentation

## 2017-05-23 DIAGNOSIS — Z7982 Long term (current) use of aspirin: Secondary | ICD-10-CM | POA: Insufficient documentation

## 2017-05-23 DIAGNOSIS — E785 Hyperlipidemia, unspecified: Secondary | ICD-10-CM | POA: Diagnosis not present

## 2017-05-23 HISTORY — PX: ESOPHAGOGASTRODUODENOSCOPY: SHX5428

## 2017-05-23 HISTORY — PX: COLONOSCOPY: SHX5424

## 2017-05-23 SURGERY — EGD (ESOPHAGOGASTRODUODENOSCOPY)
Anesthesia: Moderate Sedation

## 2017-05-23 MED ORDER — LIDOCAINE VISCOUS 2 % MT SOLN
OROMUCOSAL | Status: AC
  Filled 2017-05-23: qty 15

## 2017-05-23 MED ORDER — MIDAZOLAM HCL 5 MG/5ML IJ SOLN
INTRAMUSCULAR | Status: AC
  Filled 2017-05-23: qty 10

## 2017-05-23 MED ORDER — MIDAZOLAM HCL 5 MG/5ML IJ SOLN
INTRAMUSCULAR | Status: DC | PRN
  Administered 2017-05-23: 1 mg via INTRAVENOUS
  Administered 2017-05-23 (×2): 2 mg via INTRAVENOUS

## 2017-05-23 MED ORDER — MEPERIDINE HCL 50 MG/ML IJ SOLN
INTRAMUSCULAR | Status: AC
  Filled 2017-05-23: qty 1

## 2017-05-23 MED ORDER — LIDOCAINE VISCOUS 2 % MT SOLN
OROMUCOSAL | Status: DC | PRN
  Administered 2017-05-23: 4 mL via OROMUCOSAL

## 2017-05-23 MED ORDER — STERILE WATER FOR IRRIGATION IR SOLN
Status: DC | PRN
  Administered 2017-05-23: 200 mL

## 2017-05-23 MED ORDER — PANTOPRAZOLE SODIUM 40 MG PO TBEC: 40.0000 mg | DELAYED_RELEASE_TABLET | Freq: Every day | ORAL | 3 refills | Status: DC

## 2017-05-23 MED ORDER — MEPERIDINE HCL 50 MG/ML IJ SOLN
INTRAMUSCULAR | Status: DC | PRN
  Administered 2017-05-23 (×2): 25 mg via INTRAVENOUS

## 2017-05-23 MED ORDER — SODIUM CHLORIDE 0.9 % IV SOLN
INTRAVENOUS | Status: DC
  Administered 2017-05-23: 07:00:00 via INTRAVENOUS

## 2017-05-23 NOTE — Op Note (Signed)
[facility] Patient Name: Joseph Forbes Procedure Date: 05/23/2017 7:54 AM MRN: [id number] Date of Birth: 06-Nov-1940 Attending MD: Hildred Laser , MD CSN: [id number] Age: 77 Admit Type: Outpatient Procedure:                Colonoscopy Indications:              Abnormal CT of the GI tract Providers:                Hildred Laser, MD, Rosina Lowenstein, RN, Nelma Rothman,                            Technician Referring MD:             Zella Richer. Scotty Court, MD Medicines:                Midazolam 1 mg IV Complications:            No immediate complications. Estimated Blood Loss:     Estimated blood loss: none. Estimated blood loss                            was minimal. Procedure:                Pre-Anesthesia Assessment:                           - Prior to the procedure, a History and Physical                            was performed, and patient medications and                            allergies were reviewed. The patient's tolerance of                            previous anesthesia was also reviewed. The risks                            and benefits of the procedure and the sedation                            options and risks were discussed with the patient.                            All questions were answered, and informed consent                            was obtained. Prior Anticoagulants: The patient                            last took aspirin 2 days prior to the procedure.                            ASA Grade Assessment: III - A patient with severe  systemic disease. After reviewing the risks and                            benefits, the patient was deemed in satisfactory                            condition to undergo the procedure.                           After obtaining informed consent, the colonoscope                            was passed under direct vision. Throughout the                            procedure, the patient's blood pressure, pulse, and                             oxygen saturations were monitored continuously. The                            [id number] ([id number]) scope was introduced through                            the anus and advanced to the the cecum, identified                            by appendiceal orifice and ileocecal valve. The                            colonoscopy was performed without difficulty. The                            patient tolerated the procedure well. The quality                            of the bowel preparation was excellent. The                            ileocecal valve, appendiceal orifice, and rectum                            were photographed. Scope In: 7:59:17 AM Scope Out: 8:16:44 AM Scope Withdrawal Time: 0 hours 9 minutes 4 seconds  Total Procedure Duration: 0 hours 17 minutes 27 seconds  Findings:      The perianal and digital rectal examinations were normal.      A small polyp was found in the proximal ascending colon. The polyp was       sessile. Biopsies were taken with a cold forceps for histology.      A single small angiodysplastic lesion without bleeding was found in the       ascending colon.      Multiple medium-mouthed diverticula were found in the sigmoid colon.      External hemorrhoids were found during retroflexion. The hemorrhoids  were small. Impression:               - One small polyp in the proximal ascending colon.                            Biopsied.                           - A single non-bleeding colonic angiodysplastic                            lesion.                           - Diverticulosis in the sigmoid colon.                           - External hemorrhoids. Moderate Sedation:      Moderate (conscious) sedation was administered by the endoscopy nurse       and supervised by the endoscopist. The following parameters were       monitored: oxygen saturation, heart rate, blood pressure, CO2       capnography and response to care. Total  physician intraservice time was       17 minutes. Recommendation:           - Patient has a contact number available for                            emergencies. The signs and symptoms of potential                            delayed complications were discussed with the                            patient. Return to normal activities tomorrow.                            Written discharge instructions were provided to the                            patient.                           - High fiber diet today.                           - Continue present medications.                           - Resume aspirin at prior dose tomorrow.                           - Await pathology results.                           - Repeat colonoscopy is recommended. The  colonoscopy date will be determined after pathology                            results from today's exam become available for                            review. Procedure Code(s):        --- Professional ---                           [phone], Colonoscopy, flexible; with biopsy, single                            or multiple                           99152, Moderate sedation services provided by the                            same physician or other qualified health care                            professional performing the diagnostic or                            therapeutic service that the sedation supports,                            requiring the presence of an independent trained                            observer to assist in the monitoring of the                            patient's level of consciousness and physiological                            status; initial 15 minutes of intraservice time,                            patient age 1 years or older Diagnosis Code(s):        --- Professional ---                           D12.2, Benign neoplasm of ascending colon                           K55.20, Angiodysplasia  of colon without hemorrhage                           K64.4, Residual hemorrhoidal skin tags                           K57.30, Diverticulosis of large intestine without  perforation or abscess without bleeding                           R93.3, Abnormal findings on diagnostic imaging of                            other parts of digestive tract CPT copyright 2016 American Medical Association. All rights reserved. The codes documented in this report are preliminary and upon coder review may  be revised to meet current compliance requirements. Hildred Laser, MD Hildred Laser, MD 05/23/2017 8:34:18 AM This report has been signed electronically. Number of Addenda: 0

## 2017-05-23 NOTE — H&P (Addendum)
Joseph Forbes is an 77 y.o. male.   Chief Complaint: Patient is here for EGD and colonoscopy. HPI: Patient is 77 year old Caucasian male who experienced melena 3 weeks ago.  He was seen in emergency room and discharged.  Had abdominopelvic CT while in the emergency room and it showed colonic diverticulosis and rectal wall thickening.  Patient denies abdominal pain nausea vomiting.  He has history of GERD and is on Pepcid once a day which is not enough.  He says his prescription got changed from 2 tablets a day to 1 and is not working.  He denies dysphagia.  He also denies rectal bleeding.  His good appetite and has not lost any weight.  He is on low-dose aspirin but does not take other OTC NSAIDs.  Last aspirin dose was 2 days ago. He has never been screened for CRC. Family history is negative for CRC.  Past Medical History:  Diagnosis Date  . CAD (coronary artery disease)    Normal LV function .Status post stent placement to the right coronary artery in 2000 and 2001Obstructive coronary artery disease in November 2008 by catheterization   . Carotid artery disease ([place])   . COPD (chronic obstructive pulmonary disease) ([place])   . GERD (gastroesophageal reflux disease)   . Hyperlipidemia   . Hypertension    pt denies, no meds    Past Surgical History:  Procedure Laterality Date  . CARDIAC CATHETERIZATION  02/2007  . cardiac stents     2 stents placed   . CAROTID ENDARTERECTOMY Bilateral 2010      (here at [facility])  CE  . FRACTURE SURGERY     right arm  . HYDROCELE EXCISION Right 04/24/2013   Procedure: HYDROCELECTOMY ADULT;  Surgeon: Hanley Ben, MD;  Location: [facility];  Service: Urology;  Laterality: Right;  . JOINT REPLACEMENT Left Nov. 3, 2015   Hip  . TOTAL HIP ARTHROPLASTY Left 02/19/2014   Procedure: TOTAL HIP ARTHROPLASTY;  Surgeon: Garald Balding, MD;  Location: [facility];  Service: Orthopedics;  Laterality: Left;    Family History  Problem Relation Age of  Onset  . Heart disease Mother        Before age 78  . Heart disease Father   . Heart attack Father   . Colon cancer Neg Hx    Social History:  reports that he has been smoking cigarettes.  He started smoking about 63 years ago. He has a 55.00 pack-year smoking history. His smokeless tobacco use includes chew. He reports that he does not drink alcohol or use drugs.  Allergies:  Allergies  Allergen Reactions  . Clarithromycin Nausea Only  . Other Itching, Rash and Other (See Comments)    Some medication OTC he took for headache caused a rash    Medications Prior to Admission  Medication Sig Dispense Refill  . aspirin 81 MG tablet Take 81 mg by mouth daily.    Marland Kitchen atorvastatin (LIPITOR) 80 MG tablet TAKE ONE TABLET BY MOUTH DAILY 15 tablet 0  . famotidine (PEPCID) 20 MG tablet Take 20 mg by mouth daily.     Marland Kitchen PROAIR HFA 108 (90 Base) MCG/ACT inhaler Inhale 2 puffs into the lungs every 6 (six) hours as needed for wheezing or shortness of breath.     . nitroGLYCERIN (NITROSTAT) 0.4 MG SL tablet Place 0.4 mg under the tongue every 5 (five) minutes as needed for chest pain.       No results found for this  or any previous visit (from the past 48 hour(s)). No results found.  ROS  Blood pressure (!) 156/76, pulse 100, temperature 97.8 F (36.6 C), temperature source Oral, resp. rate 19, height 6\' 3"  (1.905 m), weight 175 lb (79.4 kg), SpO2 97 %. Physical Exam  Constitutional: He appears well-developed and well-nourished.  HENT:  Mouth/Throat: Oropharynx is clear and moist.  Eyes: Conjunctivae are normal. No scleral icterus.  Neck:  Bilateral carotid endarterectomy scars  Cardiovascular: Normal rate, regular rhythm and normal heart sounds.  No murmur heard. Respiratory: Effort normal.  Few rhonchi at right base.  GI:  Abdomen is flat soft and nontender without organomegaly or masses.  Musculoskeletal: He exhibits no edema.  Neurological: He is alert.  Skin: Skin is warm and dry.      Assessment/Plan History of melena. Abnormal CT revealing rectal wall thickening. Diagnostic EGD and colonoscopy.  Hildred Laser, MD 05/23/2017, 7:33 AM

## 2017-05-23 NOTE — Op Note (Signed)
[facility] Patient Name: Jeffree Cazeau Procedure Date: 05/23/2017 7:13 AM MRN: [id number] Date of Birth: 16-Mar-1941 Attending MD: Hildred Laser , MD CSN: [id number] Age: 77 Admit Type: Outpatient Procedure:                Upper GI endoscopy Indications:              Melena Providers:                Hildred Laser, MD, Rosina Lowenstein, RN, Nelma Rothman,                            Technician Referring MD:             Zella Richer. Scotty Court, MD Medicines:                Lidocaine spray, Meperidine 50 mg IV, Midazolam 4                            mg IV Complications:            No immediate complications. Estimated Blood Loss:     Estimated blood loss: none. Procedure:                Pre-Anesthesia Assessment:                           - Prior to the procedure, a History and Physical                            was performed, and patient medications and                            allergies were reviewed. The patient's tolerance of                            previous anesthesia was also reviewed. The risks                            and benefits of the procedure and the sedation                            options and risks were discussed with the patient.                            All questions were answered, and informed consent                            was obtained. Prior Anticoagulants: The patient                            last took aspirin 2 days prior to the procedure.                            ASA Grade Assessment: III - A patient with severe  systemic disease. After reviewing the risks and                            benefits, the patient was deemed in satisfactory                            condition to undergo the procedure.                           After obtaining informed consent, the endoscope was                            passed under direct vision. Throughout the                            procedure, the patient's blood pressure, pulse, and                  oxygen saturations were monitored continuously. The                            [id number] ([id number]) scope was introduced through the                            mouth, and advanced to the third part of duodenum.                            The upper GI endoscopy was accomplished without                            difficulty. The patient tolerated the procedure                            well. Scope In: 7:47:51 AM Scope Out: 7:53:50 AM Total Procedure Duration: 0 hours 5 minutes 59 seconds  Findings:      The proximal esophagus and mid esophagus were normal.      LA Grade A (one or more mucosal breaks less than 5 mm, not extending       between tops of 2 mucosal folds) esophagitis was found 39 to 40 cm from       the incisors.      The Z-line was irregular and was found 40 cm from the incisors.      A 2 cm hiatal hernia was present.      The entire examined stomach was normal.      The duodenal bulb, second portion of the duodenum and third portion of       the duodenum were normal. Impression:               - Normal proximal esophagus and mid esophagus.                           - LA Grade A reflux esophagitis.                           - Z-line irregular, 40 cm from the incisors.                           -  2 cm hiatal hernia.                           - Normal stomach.                           - Normal duodenal bulb, second portion of the                            duodenum and third portion of the duodenum.                           - No specimens collected. Moderate Sedation:      Moderate (conscious) sedation was administered by the endoscopy nurse       and supervised by the endoscopist. The following parameters were       monitored: oxygen saturation, heart rate, blood pressure, CO2       capnography and response to care. Total physician intraservice time was       12 minutes. Recommendation:           - Patient has a contact number available for                             emergencies. The signs and symptoms of potential                            delayed complications were discussed with the                            patient. Return to normal activities tomorrow.                            Written discharge instructions were provided to the                            patient.                           - High fiber diet today.                           - Continue present medications but ddiscontinue                            Pepcid.                           - Resume aspirin at prior dose tomorrow.                           - Use Protonix (pantoprazole) 40 mg PO daily. Procedure Code(s):        --- Professional ---                           [id number], Esophagogastroduodenoscopy, flexible,  transoral; diagnostic, including collection of                            specimen(s) by brushing or washing, when performed                            (separate procedure)                           99152, Moderate sedation services provided by the                            same physician or other qualified health care                            professional performing the diagnostic or                            therapeutic service that the sedation supports,                            requiring the presence of an independent trained                            observer to assist in the monitoring of the                            patient's level of consciousness and physiological                            status; initial 15 minutes of intraservice time,                            patient age 47 years or older Diagnosis Code(s):        --- Professional ---                           K21.0, Gastro-esophageal reflux disease with                            esophagitis                           K22.8, Other specified diseases of esophagus                           K44.9, Diaphragmatic hernia without obstruction or                             gangrene                           K92.1, Melena (includes Hematochezia) CPT copyright 2016 American Medical Association. All rights reserved. The codes documented in this report are preliminary and upon coder review may  be revised to meet current compliance requirements. Hildred Laser, MD Hildred Laser, MD 05/23/2017  8:29:49 AM This report has been signed electronically. Number of Addenda: 0

## 2017-05-23 NOTE — Discharge Instructions (Signed)
Resume aspirin on 05/24/2017. Discontinue Pepcid/famotidine. Resume other medications as before. Pantoprazole 40 mg by mouth 30 minutes before breakfast daily. High fiber diet. No driving for 24 hours. Physician will call with biopsy results.   Colonoscopy, Adult, Care After This sheet gives you information about how to care for yourself after your procedure. Your doctor may also give you more specific instructions. If you have problems or questions, call your doctor. Follow these instructions at home: General instructions   For the first 24 hours after the procedure: ? Do not drive or use machinery. ? Do not sign important documents. ? Do not drink alcohol. ? Do your daily activities more slowly than normal. ? Eat foods that are soft and easy to digest. ? Rest often.  Take over-the-counter or prescription medicines only as told by your doctor.  It is up to you to get the results of your procedure. Ask your doctor, or the department performing the procedure, when your results will be ready. To help cramping and bloating:  Try walking around.  Put heat on your belly (abdomen) as told by your doctor. Use a heat source that your doctor recommends, such as a moist heat pack or a heating pad. ? Put a towel between your skin and the heat source. ? Leave the heat on for 20-30 minutes. ? Remove the heat if your skin turns bright red. This is especially important if you cannot feel pain, heat, or cold. You can get burned. Eating and drinking  Drink enough fluid to keep your pee (urine) clear or pale yellow.  Return to your normal diet as told by your doctor. Avoid heavy or fried foods that are hard to digest.  Avoid drinking alcohol for as long as told by your doctor. Contact a doctor if:  You have blood in your poop (stool) 2-3 days after the procedure. Get help right away if:  You have more than a small amount of blood in your poop.  You see large clumps of tissue (blood clots)  in your poop.  Your belly is swollen.  You feel sick to your stomach (nauseous).  You throw up (vomit).  You have a fever.  You have belly pain that gets worse, and medicine does not help your pain. This information is not intended to replace advice given to you by your health care provider. Make sure you discuss any questions you have with your health care provider. Document Released: 05/08/2010 Document Revised: 12/29/2015 Document Reviewed: 12/29/2015 Elsevier Interactive Patient Education  2017 [facility], Care After Refer to this sheet in the next few weeks. These instructions provide you with information about caring for yourself after your procedure. Your health care provider may also give you more specific instructions. Your treatment has been planned according to current medical practices, but problems sometimes occur. Call your health care provider if you have any problems or questions after your procedure. What can I expect after the procedure? After the procedure, it is common to have:  A sore throat.  Bloating.  Nausea.  Follow these instructions at home:  Follow instructions from your health care provider about what to eat or drink after your procedure.  Return to your normal activities as told by your health care provider. Ask your health care provider what activities are safe for you.  Take over-the-counter and prescription medicines only as told by your health care provider.  Do not drive for 24 hours if you received a sedative.  Keep  all follow-up visits as told by your health care provider. This is important. Contact a health care provider if:  You have a sore throat that lasts longer than one day.  You have trouble swallowing. Get help right away if:  You have a fever.  You vomit blood or your vomit looks like coffee grounds.  You have bloody, black, or tarry stools.  You have a severe sore throat or you cannot swallow.  You  have difficulty breathing.  You have severe pain in your chest or belly. This information is not intended to replace advice given to you by your health care provider. Make sure you discuss any questions you have with your health care provider. Document Released: 10/05/2011 Document Revised: 09/11/2015 Document Reviewed: 01/16/2015 Elsevier Interactive Patient Education  2018 [employer].  Hiatal Hernia A hiatal hernia occurs when part of the stomach slides above the muscle that separates the abdomen from the chest (diaphragm). A person can be born with a hiatal hernia (congenital), or it may develop over time. In almost all cases of hiatal hernia, only the top part of the stomach pushes through the diaphragm. Many people have a hiatal hernia with no symptoms. The larger the hernia, the more likely it is that you will have symptoms. In some cases, a hiatal hernia allows stomach acid to flow back into the tube that carries food from your mouth to your stomach (esophagus). This may cause heartburn symptoms. Severe heartburn symptoms may mean that you have developed a condition called gastroesophageal reflux disease (GERD). What are the causes? This condition is caused by a weakness in the opening (hiatus) where the esophagus passes through the diaphragm to attach to the upper part of the stomach. A person may be born with a weakness in the hiatus, or a weakness can develop over time. What increases the risk? This condition is more likely to develop in:  Older people. Age is a major risk factor for a hiatal hernia, especially if you are over the age of 43.  Pregnant women.  People who are overweight.  People who have frequent constipation.  What are the signs or symptoms? Symptoms of this condition usually develop in the form of GERD symptoms. Symptoms include:  Heartburn.  Belching.  Indigestion.  Trouble swallowing.  Coughing or wheezing.  Sore throat.  Hoarseness.  Chest  pain.  Nausea and vomiting.  How is this diagnosed? This condition may be diagnosed during testing for GERD. Tests that may be done include:  X-rays of your stomach or chest.  An upper gastrointestinal (GI) series. This is an X-ray exam of your GI tract that is taken after you swallow a chalky liquid that shows up clearly on the X-ray.  Endoscopy. This is a procedure to look into your stomach using a thin, flexible tube that has a tiny camera and light on the end of it.  How is this treated? This condition may be treated by:  Dietary and lifestyle changes to help reduce GERD symptoms.  Medicines. These may include: ? Over-the-counter antacids. ? Medicines that make your stomach empty more quickly. ? Medicines that block the production of stomach acid (H2 blockers). ? Stronger medicines to reduce stomach acid (proton pump inhibitors).  Surgery to repair the hernia, if other treatments are not helping.  If you have no symptoms, you may not need treatment. Follow these instructions at home: Lifestyle and activity  Do not use any products that contain nicotine or tobacco, such as cigarettes and  e-cigarettes. If you need help quitting, ask your health care provider.  Try to achieve and maintain a healthy body weight.  Avoid putting pressure on your abdomen. Anything that puts pressure on your abdomen increases the amount of acid that may be pushed up into your esophagus. ? Avoid bending over, especially after eating. ? Raise the head of your bed by putting blocks under the legs. This keeps your head and esophagus higher than your stomach. ? Do not wear tight clothing around your chest or stomach. ? Try not to strain when having a bowel movement, when urinating, or when lifting heavy objects. Eating and drinking  Avoid foods that can worsen GERD symptoms. These may include: ? Fatty foods, like fried foods. ? Citrus fruits, like oranges or lemon. ? Other foods and drinks that  contain acid, like orange juice or tomatoes. ? Spicy food. ? Chocolate.  Eat frequent small meals instead of three large meals a day. This helps prevent your stomach from getting too full. ? Eat slowly. ? Do not lie down right after eating. ? Do not eat 1-2 hours before bed.  Do not drink beverages with caffeine. These include cola, coffee, cocoa, and tea.  Do not drink alcohol. General instructions  Take over-the-counter and prescription medicines only as told by your health care provider.  Keep all follow-up visits as told by your health care provider. This is important. Contact a health care provider if:  Your symptoms are not controlled with medicines or lifestyle changes.  You are having trouble swallowing.  You have coughing or wheezing that will not go away. Get help right away if:  Your pain is getting worse.  Your pain spreads to your arms, neck, jaw, teeth, or back.  You have shortness of breath.  You sweat for no reason.  You feel sick to your stomach (nauseous) or you vomit.  You vomit blood.  You have bright red blood in your stools.  You have black, tarry stools. This information is not intended to replace advice given to you by your health care provider. Make sure you discuss any questions you have with your health care provider. Document Released: 06/26/2003 Document Revised: 03/29/2016 Document Reviewed: 03/29/2016 Elsevier Interactive Patient Education  2018 [employer].  Diverticulosis Diverticulosis is a condition that develops when small pouches (diverticula) form in the wall of the large intestine (colon). The colon is where water is absorbed and stool is formed. The pouches form when the inside layer of the colon pushes through weak spots in the outer layers of the colon. You may have a few pouches or many of them. What are the causes? The cause of this condition is not known. What increases the risk? The following factors may make you more  likely to develop this condition:  Being older than age 108. Your risk for this condition increases with age. Diverticulosis is rare among people younger than age 52. By age 59, many people have it.  Eating a low-fiber diet.  Having frequent constipation.  Being overweight.  Not getting enough exercise.  Smoking.  Taking over-the-counter pain medicines, like aspirin and ibuprofen.  Having a family history of diverticulosis.  What are the signs or symptoms? In most people, there are no symptoms of this condition. If you do have symptoms, they may include:  Bloating.  Cramps in the abdomen.  Constipation or diarrhea.  Pain in the lower left side of the abdomen.  How is this diagnosed? This condition is most often  diagnosed during an exam for other colon problems. Because diverticulosis usually has no symptoms, it often cannot be diagnosed independently. This condition may be diagnosed by:  Using a flexible scope to examine the colon (colonoscopy).  Taking an X-ray of the colon after dye has been put into the colon (barium enema).  Doing a CT scan.  How is this treated? You may not need treatment for this condition if you have never developed an infection related to diverticulosis. If you have had an infection before, treatment may include:  Eating a high-fiber diet. This may include eating more fruits, vegetables, and grains.  Taking a fiber supplement.  Taking a live bacteria supplement (probiotic).  Taking medicine to relax your colon.  Taking antibiotic medicines.  Follow these instructions at home:  Drink 6-8 glasses of water or more each day to prevent constipation.  Try not to strain when you have a bowel movement.  If you have had an infection before: ? Eat more fiber as directed by your health care provider or your diet and nutrition specialist (dietitian). ? Take a fiber supplement or probiotic, if your health care provider approves.  Take  over-the-counter and prescription medicines only as told by your health care provider.  If you were prescribed an antibiotic, take it as told by your health care provider. Do not stop taking the antibiotic even if you start to feel better.  Keep all follow-up visits as told by your health care provider. This is important. Contact a health care provider if:  You have pain in your abdomen.  You have bloating.  You have cramps.  You have not had a bowel movement in 3 days. Get help right away if:  Your pain gets worse.  Your bloating becomes very bad.  You have a fever or chills, and your symptoms suddenly get worse.  You vomit.  You have bowel movements that are bloody or black.  You have bleeding from your rectum. Summary  Diverticulosis is a condition that develops when small pouches (diverticula) form in the wall of the large intestine (colon).  You may have a few pouches or many of them.  This condition is most often diagnosed during an exam for other colon problems.  If you have had an infection related to diverticulosis, treatment may include increasing the fiber in your diet, taking supplements, or taking medicines. This information is not intended to replace advice given to you by your health care provider. Make sure you discuss any questions you have with your health care provider. Document Released: 01/01/2004 Document Revised: 02/23/2016 Document Reviewed: 02/23/2016 Elsevier Interactive Patient Education  2017 [employer].

## 2017-05-25 ENCOUNTER — Encounter (HOSPITAL_COMMUNITY): Payer: Self-pay | Admitting: Internal Medicine

## 2017-06-16 IMAGING — XA IR KYPHO VERTEBRAL THORACIC AUGMENTATION
1 series · 14 of 17 positions shown · non-contrast
Comparison: none

CLINICAL DATA: Severe low back pain secondary to compression
fracture at T12.

[Series 300: ir kypho vertebral thoracic augmentation · 14 of 17 slices shown]
[im 1/17]
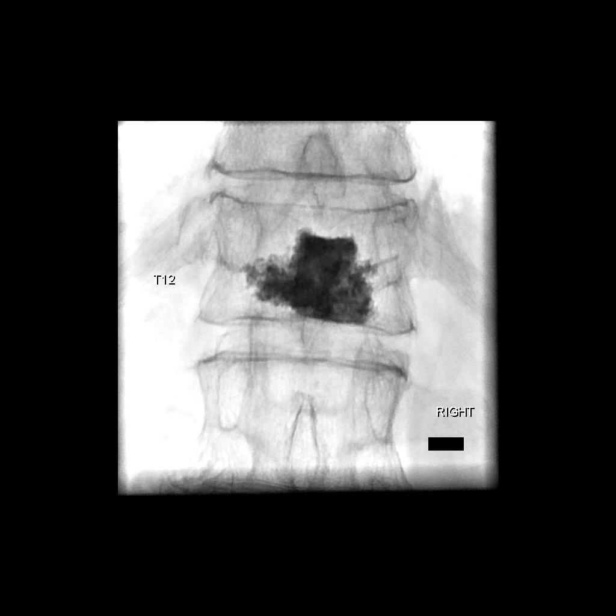
[im 2/17]
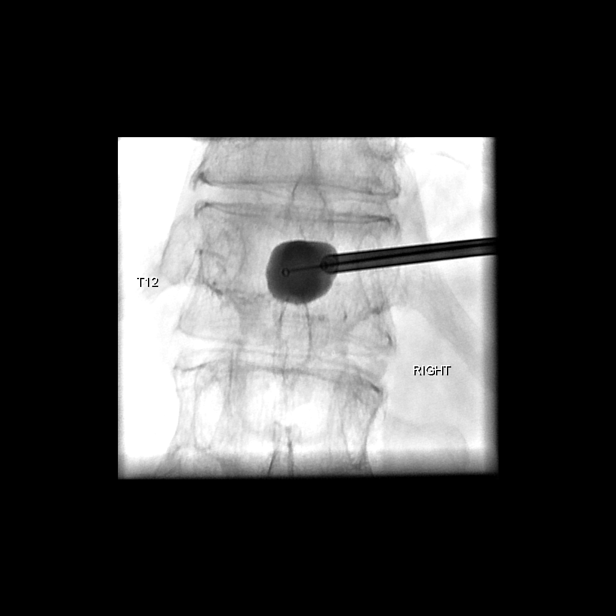
[im 4/17]
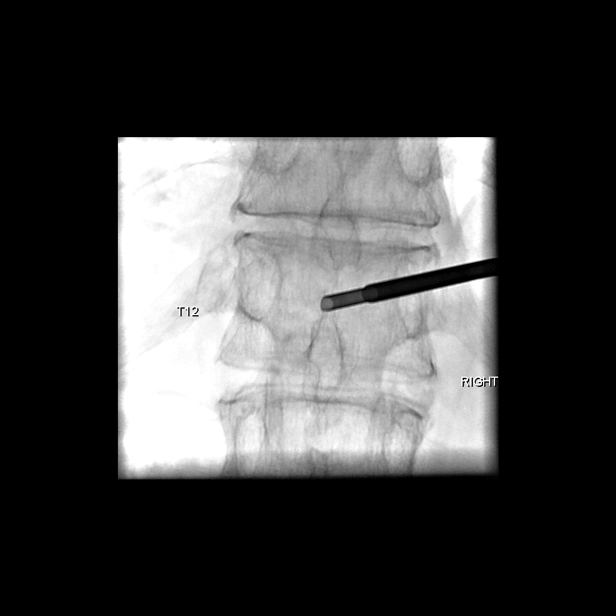
[im 5/17]
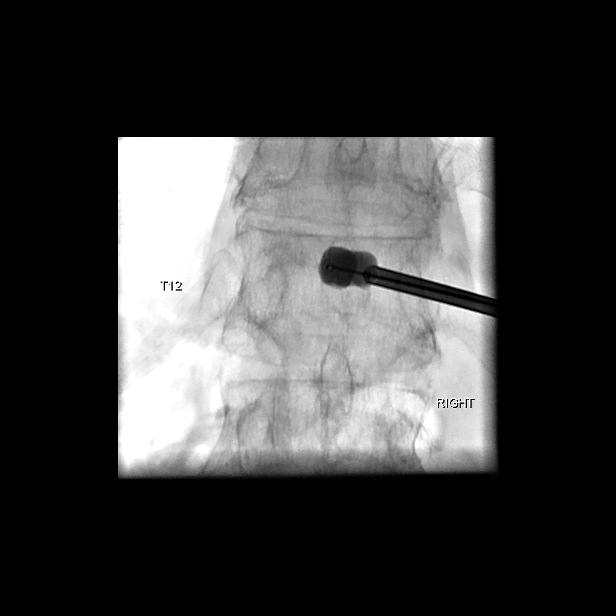
[im 6/17]
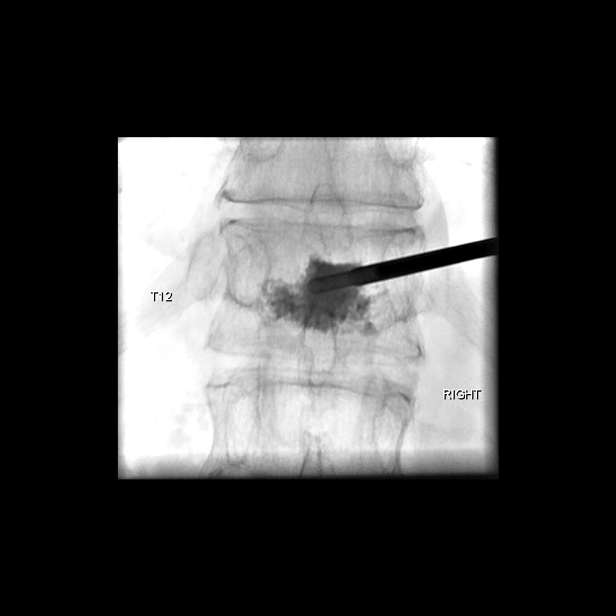
[im 7/17]
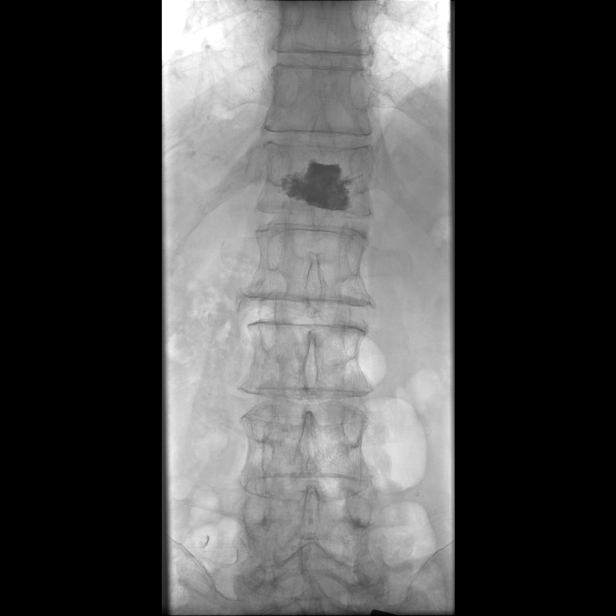
[im 8/17]
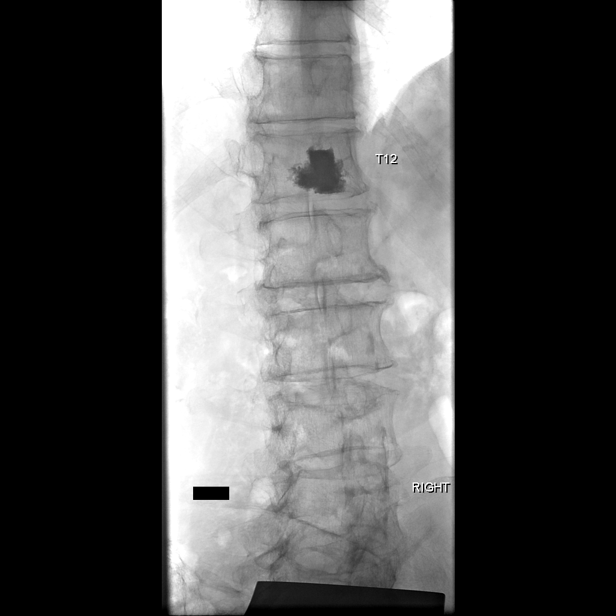
[im 10/17]
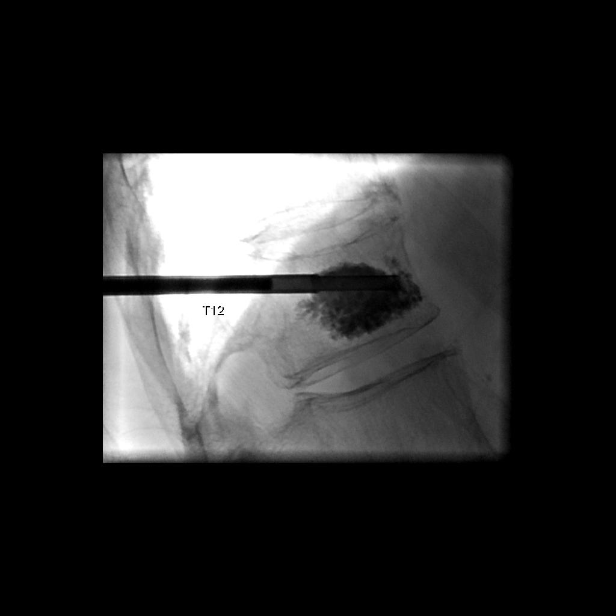
[im 11/17]
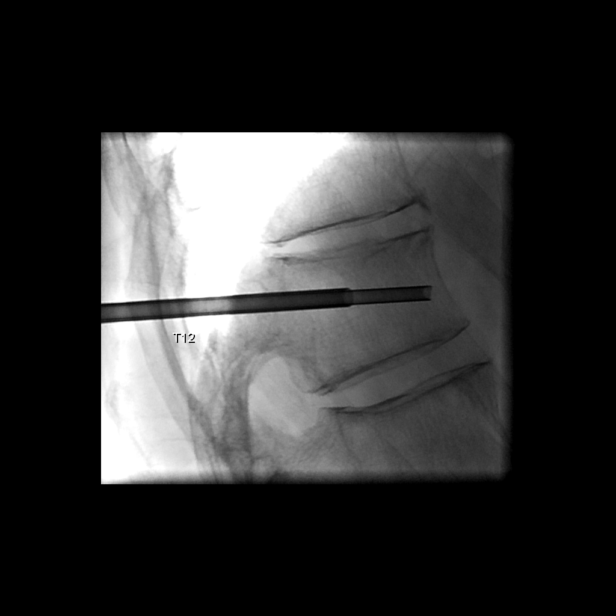
[im 12/17]
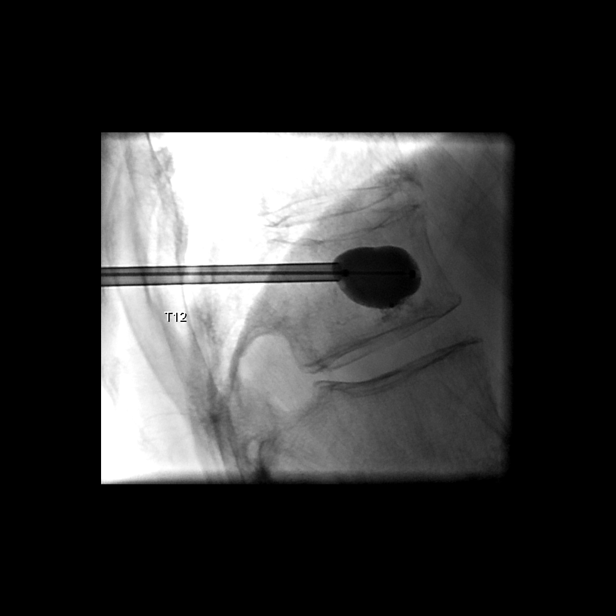
[im 13/17]
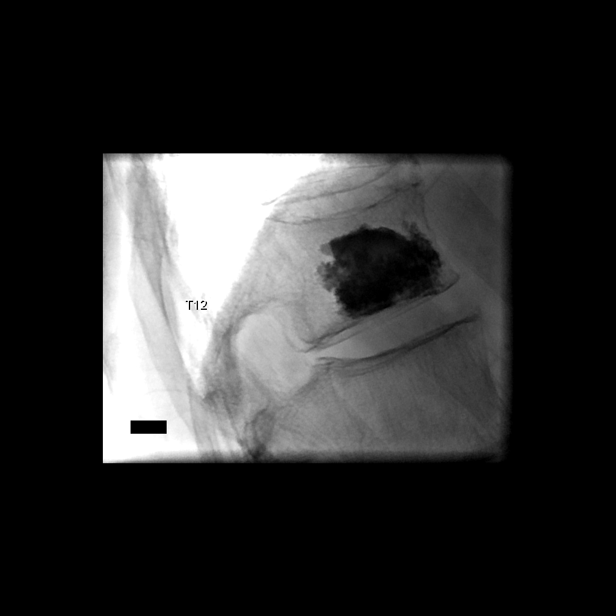
[im 14/17]
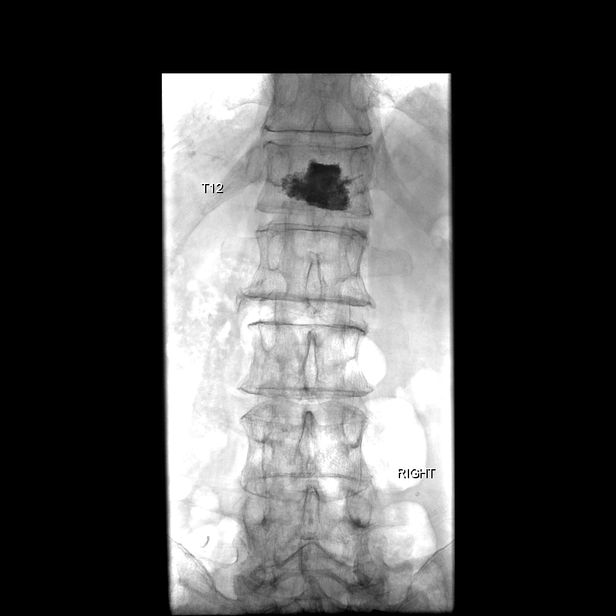
[im 16/17]
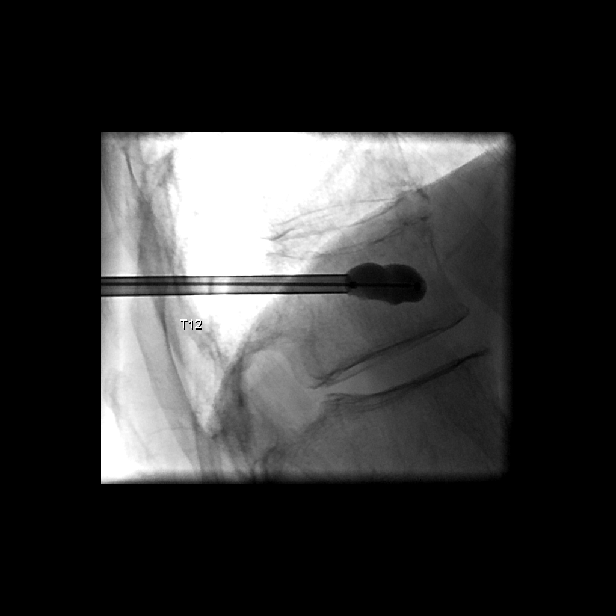
[im 17/17]
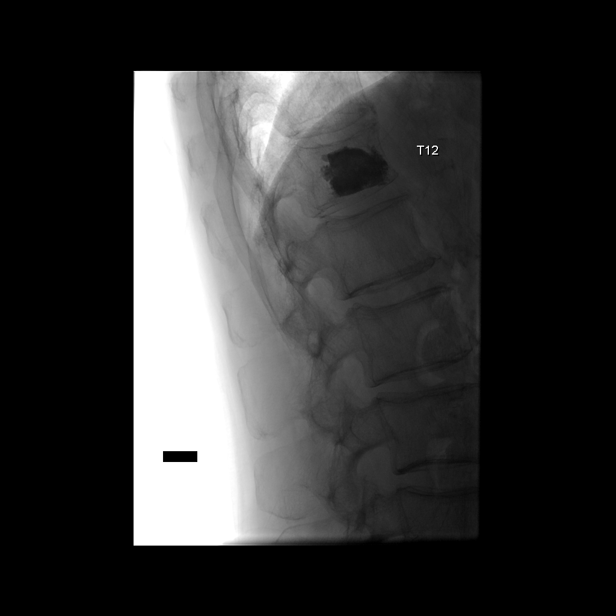

[14 of 17 positions shown; findings below may reference images not displayed]

EXAM:
KYPHOPLASTY AT T12

PROCEDURE:
Following a full explanation of the procedure along with the
potential associated complications, an informed witnessed consent
was obtained.

The patient was placed prone on the fluoroscopic table. The skin
overlying the thoracolumbar region was then prepped and draped in
the usual sterile fashion. The right pedicle at T12 was then
infiltrated with 0.25% bupivacaine followed by the advancement of an
11-gauge Jamshidi needle through the right pedicle into the
posterior one-third at T12. This was then exchanged for a Kyphon
advanced osteo introducer system comprised of a working cannula and
a Kyphon osteo drill.

This combination was then advanced over a Kyphon osteo bone pin
until the tip of the Kyphon osteo drill was in the posterior
one-third at T12.

At this time, the bone pin was removed. In a medial trajectory, the
combination was advanced until the tip of the working cannula was
inside the posterior one-third at T12.

The osteo drill was removed and a core sample sent for pathologic
analysis.

Through the working cannula, a Kyphon bone biopsy device was
advanced to within 5 mm of the anterior aspect. A core sample from
this was also sent for pathologic analysis.

Through the working cannula, a Kyphon inflatable bone tamp 20 x 3
was advanced and positioned with the distal marker 5 mm from the
anterior aspect of T2. Crossing of the midline was seen on the AP
projection. At this time, the balloon was expanded using contrast
via a Kyphon inflation syringe device via microtubing.

Inflations were continued until there was apposition with the
superior and the inferior end plates.

At this time, methylmethacrylate mixture was reconstituted with
Tobramycin in the Kyphon bone mixing device system. This was then
loaded onto the Kyphon bone fillers.

The balloon was deflated and removed followed by the instillation of
5 bone filler equivalents of methylmethacrylate mixture at T12 with
excellent filling in the AP and lateral projections. No
extravasation was noted in the disk spaces or posteriorly into the
spinal canal. No epidural venous contamination was seen.

The patient tolerated the procedure well. There were no acute
complications. The working cannula and the bone filler were then
retrieved and removed.

Medications utilized: Versed 3 mg IV and Fentanyl 125 micrograms IV.
Dilaudid 1 mg IV.

Total Moderate Sedation Time:  38 minutes.
IMPRESSION: 1. Status post fluoroscopic-guided needle placement for deep core
bone biopsy at T12.

2. Status post vertebral body augmentation using balloon kyphoplasty
at T12 as described without event.

## 2017-09-08 ENCOUNTER — Other Ambulatory Visit (INDEPENDENT_AMBULATORY_CARE_PROVIDER_SITE_OTHER): Payer: Self-pay | Admitting: Internal Medicine

## 2017-09-20 DIAGNOSIS — S338XXA Sprain of other parts of lumbar spine and pelvis, initial encounter: Secondary | ICD-10-CM | POA: Diagnosis not present

## 2017-09-20 DIAGNOSIS — M9901 Segmental and somatic dysfunction of cervical region: Secondary | ICD-10-CM | POA: Diagnosis not present

## 2017-09-20 DIAGNOSIS — M9902 Segmental and somatic dysfunction of thoracic region: Secondary | ICD-10-CM | POA: Diagnosis not present

## 2017-09-20 DIAGNOSIS — M9903 Segmental and somatic dysfunction of lumbar region: Secondary | ICD-10-CM | POA: Diagnosis not present

## 2017-09-20 DIAGNOSIS — S233XXA Sprain of ligaments of thoracic spine, initial encounter: Secondary | ICD-10-CM | POA: Diagnosis not present

## 2017-09-20 DIAGNOSIS — S134XXA Sprain of ligaments of cervical spine, initial encounter: Secondary | ICD-10-CM | POA: Diagnosis not present

## 2017-09-21 ENCOUNTER — Ambulatory Visit (INDEPENDENT_AMBULATORY_CARE_PROVIDER_SITE_OTHER): Payer: Medicare Other | Admitting: Orthopaedic Surgery

## 2017-09-21 DIAGNOSIS — M9903 Segmental and somatic dysfunction of lumbar region: Secondary | ICD-10-CM | POA: Diagnosis not present

## 2017-09-21 DIAGNOSIS — S134XXA Sprain of ligaments of cervical spine, initial encounter: Secondary | ICD-10-CM | POA: Diagnosis not present

## 2017-09-21 DIAGNOSIS — S338XXA Sprain of other parts of lumbar spine and pelvis, initial encounter: Secondary | ICD-10-CM | POA: Diagnosis not present

## 2017-09-21 DIAGNOSIS — M9902 Segmental and somatic dysfunction of thoracic region: Secondary | ICD-10-CM | POA: Diagnosis not present

## 2017-09-21 DIAGNOSIS — S233XXA Sprain of ligaments of thoracic spine, initial encounter: Secondary | ICD-10-CM | POA: Diagnosis not present

## 2017-09-21 DIAGNOSIS — M9901 Segmental and somatic dysfunction of cervical region: Secondary | ICD-10-CM | POA: Diagnosis not present

## 2017-09-23 DIAGNOSIS — M9902 Segmental and somatic dysfunction of thoracic region: Secondary | ICD-10-CM | POA: Diagnosis not present

## 2017-09-23 DIAGNOSIS — S134XXA Sprain of ligaments of cervical spine, initial encounter: Secondary | ICD-10-CM | POA: Diagnosis not present

## 2017-09-23 DIAGNOSIS — M9903 Segmental and somatic dysfunction of lumbar region: Secondary | ICD-10-CM | POA: Diagnosis not present

## 2017-09-23 DIAGNOSIS — S338XXA Sprain of other parts of lumbar spine and pelvis, initial encounter: Secondary | ICD-10-CM | POA: Diagnosis not present

## 2017-09-23 DIAGNOSIS — M9901 Segmental and somatic dysfunction of cervical region: Secondary | ICD-10-CM | POA: Diagnosis not present

## 2017-09-23 DIAGNOSIS — S233XXA Sprain of ligaments of thoracic spine, initial encounter: Secondary | ICD-10-CM | POA: Diagnosis not present

## 2017-09-26 ENCOUNTER — Telehealth: Payer: Self-pay | Admitting: Cardiology

## 2017-09-26 ENCOUNTER — Encounter: Payer: Self-pay | Admitting: Cardiology

## 2017-09-26 ENCOUNTER — Ambulatory Visit (INDEPENDENT_AMBULATORY_CARE_PROVIDER_SITE_OTHER): Payer: Medicare Other | Admitting: Cardiology

## 2017-09-26 ENCOUNTER — Other Ambulatory Visit: Payer: Self-pay

## 2017-09-26 ENCOUNTER — Encounter: Payer: Self-pay | Admitting: *Deleted

## 2017-09-26 VITALS — BP 129/69 | HR 79 | Ht 75.0 in | Wt 177.0 lb

## 2017-09-26 DIAGNOSIS — I2510 Atherosclerotic heart disease of native coronary artery without angina pectoris: Secondary | ICD-10-CM | POA: Diagnosis not present

## 2017-09-26 DIAGNOSIS — I6523 Occlusion and stenosis of bilateral carotid arteries: Secondary | ICD-10-CM | POA: Diagnosis not present

## 2017-09-26 DIAGNOSIS — R079 Chest pain, unspecified: Secondary | ICD-10-CM | POA: Diagnosis not present

## 2017-09-26 DIAGNOSIS — E782 Mixed hyperlipidemia: Secondary | ICD-10-CM

## 2017-09-26 DIAGNOSIS — I25118 Atherosclerotic heart disease of native coronary artery with other forms of angina pectoris: Secondary | ICD-10-CM | POA: Diagnosis not present

## 2017-09-26 NOTE — Progress Notes (Signed)
Clinical Summary Mr. Mckone is a 77 y.o.male seen today for follow up of the following medical problems.   1. CAD: prior stents in 2000 and 2001. Last cath 2008 w/ nonobstructive disease. Normal LVEF 65% by echo 2012. - nuclear stress 03/2015 which showed no specific ischemia.     - episode 1 month ago. Sharp pain entire chest, 9/10 in severity. Occurred at rest while sitting. No other symptoms. Not positional. Took NG, symptoms improved. - total of roughly 2-3 episodes. Last episode 3 weeks ago.  - chronic stable SOB. Sedentary lifestyle, yardwork is most activity he does. Chronic SOB with these activities unchanged    2. Carotid stenosis  - prior bilateral carotid endarterectomies, recent US shows good patency. Followed by vascular - no recent symptoms  3. HL - 09/2015 TC 148 HDL 53 LDL 81 TG 70 -he is compliant with statin   4. 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 - he is compliant with inhalers   5. GI bleed - recent ER visit Jan 2019 with blood in stools. - followed by GI - no recent issues Past Medical History:  Diagnosis Date  . CAD (coronary artery disease)    Normal LV function .Status post stent placement to the right coronary artery in 2000 and 2001Obstructive coronary artery disease in November 2008 by catheterization   . Carotid artery disease ([place])   . COPD (chronic obstructive pulmonary disease) ([place])   . GERD (gastroesophageal reflux disease)   . Hyperlipidemia   . Hypertension    pt denies, no meds     Allergies  Allergen Reactions  . Clarithromycin Nausea Only  . Other Itching, Rash and Other (See Comments)    Some medication OTC he took for headache caused a rash     Current Outpatient Medications  Medication Sig Dispense Refill  . aspirin 81 MG tablet Take 1 tablet (81 mg total) by mouth daily. 30 tablet   . atorvastatin (LIPITOR) 80 MG tablet TAKE ONE TABLET BY MOUTH DAILY 15 tablet 0  . nitroGLYCERIN (NITROSTAT) 0.4  MG SL tablet Place 0.4 mg under the tongue every 5 (five) minutes as needed for chest pain.     . pantoprazole (PROTONIX) 40 MG tablet TAKE ONE TABLET BY MOUTH DAILY BEFORE BREAKFAST 30 tablet 5  . PROAIR HFA 108 (90 Base) MCG/ACT inhaler Inhale 2 puffs into the lungs every 6 (six) hours as needed for wheezing or shortness of breath.      No current facility-administered medications for this visit.      Past Surgical History:  Procedure Laterality Date  . CARDIAC CATHETERIZATION  02/2007  . cardiac stents     2 stents placed   . CAROTID ENDARTERECTOMY Bilateral 2010      (here at [facility])  CE  . COLONOSCOPY N/A 05/23/2017   Procedure: COLONOSCOPY;  Surgeon: Rogene Houston, MD;  Location[facility];  Service: Endoscopy;  Laterality: N/A;  . ESOPHAGOGASTRODUODENOSCOPY N/A 05/23/2017   Procedure: ESOPHAGOGASTRODUODENOSCOPY (EGD);  Surgeon: Rogene Houston, MD;  Location: [facility];  Service: Endoscopy;  Laterality: N/A;  . FRACTURE SURGERY     right arm  . HYDROCELE EXCISION Right 04/24/2013   Procedure: HYDROCELECTOMY ADULT;  Surgeon: Hanley Ben, MD;  Location: [facility];  Service: Urology;  Laterality: Right;  . JOINT REPLACEMENT Left Nov. 3, 2015   Hip  . TOTAL HIP ARTHROPLASTY Left 02/19/2014   Procedure: TOTAL HIP ARTHROPLASTY;  Surgeon: Vonna Kotyk  Durward Fortes, MD;  Location: [facility];  Service: Orthopedics;  Laterality: Left;     Allergies  Allergen Reactions  . Clarithromycin Nausea Only  . Other Itching, Rash and Other (See Comments)    Some medication OTC he took for headache caused a rash      Family History  Problem Relation Age of Onset  . Heart disease Mother        Before age 45  . Heart disease Father   . Heart attack Father   . Colon cancer Neg Hx      Social History Mr. Loveall reports that he has been smoking cigarettes.  He started smoking about 64 years ago. He has a 55.00 pack-year smoking history. His smokeless tobacco use includes  chew. Mr. Nauert reports that he does not drink alcohol.   Review of Systems CONSTITUTIONAL: No weight loss, fever, chills, weakness or fatigue.  HEENT: Eyes: No visual loss, blurred vision, double vision or yellow sclerae.No hearing loss, sneezing, congestion, runny nose or sore throat.  SKIN: No rash or itching.  CARDIOVASCULAR: per hpi RESPIRATORY: per hpi GASTROINTESTINAL: No anorexia, nausea, vomiting or diarrhea. No abdominal pain or blood.  GENITOURINARY: No burning on urination, no polyuria NEUROLOGICAL: No headache, dizziness, syncope, paralysis, ataxia, numbness or tingling in the extremities. No change in bowel or bladder control.  MUSCULOSKELETAL: No muscle, back pain, joint pain or stiffness.  LYMPHATICS: No enlarged nodes. No history of splenectomy.  PSYCHIATRIC: No history of depression or anxiety.  ENDOCRINOLOGIC: No reports of sweating, cold or heat intolerance. No polyuria or polydipsia.  Marland Kitchen   Physical Examination Vitals:   09/26/17 0958  BP: 129/69  Pulse: 79  SpO2: 96%   Vitals:   09/26/17 0958  Weight: 177 lb (80.3 kg)  Height: 6\' 3"  (1.905 m)    Gen: resting comfortably, no acute distress HEENT: no scleral icterus, pupils equal round and reactive, no palptable cervical adenopathy,  CV: RRR, no m/r/g, no jvd Resp: Clear to auscultation bilaterally GI: abdomen is soft, non-tender, non-distended, normal bowel sounds, no hepatosplenomegaly MSK: extremities are warm, no edema.  Skin: warm, no rash Neuro:  no focal deficits Psych: appropriate affect   Diagnostic Studies 12/12 DSE: Baseline LVEF 55-60%. Negative for ischemia.   07/2012 Carotids: patent bilaterally w/ minimal plaque/hyperplasia, vertebals normal bilateral   01/02/13 EKG: SR, normal axis, no ischemic changes  03/2015 Dobutamine nuclear stress  Defect 1: There is a small defect of moderate severity present in the basal inferoseptal, mid inferoseptal, apical septal and apical inferior  location. This is most likely due to soft tissue attenuation artifact. However, myocardial scar cannot entirely be ruled out.  This is a low risk study. No ischemic territories.  Nuclear stress EF: 58%.        Assessment and Plan  1. CAD - recent episodes of chest pain, chronic SOB - EKG today shows SR, no ischemic changes - we will plan for dobutamine nuclear stress to evaluate for ischemia  2. Carotid stenosis - continue to follow with vascular - continue current medical therapy  3. Hyperlipidemia - he will continue statin, repeat lipid panel  F/u 6 weeks         Arnoldo Lenis, M.D.

## 2017-09-26 NOTE — Patient Instructions (Addendum)
Your physician recommends that you schedule a follow-up appointment in: [place] recommends that you continue on your current medications as directed. Please refer to the Current Medication list given to you today.  Your physician recommends that you return for lab work LIPIDS - PLEASE FAST 6-8 [place]  Your physician has requested that you have a dobutamine myoview. For furth information please visit [email or web address]. Please follow instruction sheet, as given.  Thank you for choosing [facility]!!

## 2017-09-26 NOTE — Telephone Encounter (Signed)
dobutamine nuc stress test scheduled at [facility] October 03, 2017 arrive at 10am

## 2017-09-27 DIAGNOSIS — M9901 Segmental and somatic dysfunction of cervical region: Secondary | ICD-10-CM | POA: Diagnosis not present

## 2017-09-27 DIAGNOSIS — S134XXA Sprain of ligaments of cervical spine, initial encounter: Secondary | ICD-10-CM | POA: Diagnosis not present

## 2017-09-27 DIAGNOSIS — M9903 Segmental and somatic dysfunction of lumbar region: Secondary | ICD-10-CM | POA: Diagnosis not present

## 2017-09-27 DIAGNOSIS — S233XXA Sprain of ligaments of thoracic spine, initial encounter: Secondary | ICD-10-CM | POA: Diagnosis not present

## 2017-09-27 DIAGNOSIS — S338XXA Sprain of other parts of lumbar spine and pelvis, initial encounter: Secondary | ICD-10-CM | POA: Diagnosis not present

## 2017-09-27 DIAGNOSIS — M9902 Segmental and somatic dysfunction of thoracic region: Secondary | ICD-10-CM | POA: Diagnosis not present

## 2017-09-28 ENCOUNTER — Telehealth: Payer: Self-pay

## 2017-09-28 NOTE — Telephone Encounter (Signed)
Patient notified. Routed to PCP 

## 2017-09-28 NOTE — Telephone Encounter (Signed)
-----   Message from Arnoldo Lenis, MD sent at 09/28/2017  2:02 PM EDT ----- Cholesterol is at goal  Zandra Abts MD

## 2017-09-30 ENCOUNTER — Encounter: Payer: Self-pay | Admitting: Cardiology

## 2017-09-30 DIAGNOSIS — M9901 Segmental and somatic dysfunction of cervical region: Secondary | ICD-10-CM | POA: Diagnosis not present

## 2017-09-30 DIAGNOSIS — M9903 Segmental and somatic dysfunction of lumbar region: Secondary | ICD-10-CM | POA: Diagnosis not present

## 2017-09-30 DIAGNOSIS — S233XXA Sprain of ligaments of thoracic spine, initial encounter: Secondary | ICD-10-CM | POA: Diagnosis not present

## 2017-09-30 DIAGNOSIS — S338XXA Sprain of other parts of lumbar spine and pelvis, initial encounter: Secondary | ICD-10-CM | POA: Diagnosis not present

## 2017-09-30 DIAGNOSIS — S134XXA Sprain of ligaments of cervical spine, initial encounter: Secondary | ICD-10-CM | POA: Diagnosis not present

## 2017-09-30 DIAGNOSIS — M9902 Segmental and somatic dysfunction of thoracic region: Secondary | ICD-10-CM | POA: Diagnosis not present

## 2017-10-03 ENCOUNTER — Encounter (HOSPITAL_BASED_OUTPATIENT_CLINIC_OR_DEPARTMENT_OTHER)
Admission: RE | Admit: 2017-10-03 | Discharge: 2017-10-03 | Disposition: A | Discharge status: Home / Self Care | Payer: Medicare Other | Source: Ambulatory Visit | Attending: Cardiology | Admitting: Cardiology

## 2017-10-03 ENCOUNTER — Encounter (HOSPITAL_COMMUNITY)
Admission: RE | Admit: 2017-10-03 | Discharge: 2017-10-03 | Disposition: A | Discharge status: Home / Self Care | Payer: Medicare Other | Source: Ambulatory Visit | Attending: Cardiology | Admitting: Cardiology

## 2017-10-03 ENCOUNTER — Encounter (HOSPITAL_COMMUNITY): Payer: Self-pay

## 2017-10-03 DIAGNOSIS — R079 Chest pain, unspecified: Secondary | ICD-10-CM

## 2017-10-03 LAB — NM MYOCAR MULTI W/SPECT W/WALL MOTION / EF
CHL CUP NUCLEAR SRS: 1
CHL CUP RESTING HR STRESS: 61 {beats}/min
LHR: 0.39
LV dias vol: 96 mL (ref 62–150)
LVSYSVOL: 45 mL
MPHR: 144 {beats}/min
NUC STRESS TID: 1.04
Peak HR: 137 {beats}/min
Percent HR: 95 %
SDS: 1
SSS: 2

## 2017-10-03 MED ORDER — SODIUM CHLORIDE 0.9% FLUSH
INTRAVENOUS | Status: AC
  Filled 2017-10-03: qty 160

## 2017-10-03 MED ORDER — TECHNETIUM TC 99M TETROFOSMIN IV KIT
30.0000 | PACK | Freq: Once | INTRAVENOUS | Status: AC | PRN
  Administered 2017-10-03: 31 via INTRAVENOUS

## 2017-10-03 MED ORDER — DOBUTAMINE IN D5W 4-5 MG/ML-% IV SOLN
INTRAVENOUS | Status: AC
  Administered 2017-10-03: 12:00:00 via INTRAVENOUS
  Filled 2017-10-03: qty 250

## 2017-10-03 MED ORDER — TECHNETIUM TC 99M TETROFOSMIN IV KIT
10.0000 | PACK | Freq: Once | INTRAVENOUS | Status: AC | PRN
  Administered 2017-10-03: 10 via INTRAVENOUS

## 2017-10-06 ENCOUNTER — Telehealth: Payer: Self-pay

## 2017-10-06 DIAGNOSIS — S338XXA Sprain of other parts of lumbar spine and pelvis, initial encounter: Secondary | ICD-10-CM | POA: Diagnosis not present

## 2017-10-06 DIAGNOSIS — S233XXA Sprain of ligaments of thoracic spine, initial encounter: Secondary | ICD-10-CM | POA: Diagnosis not present

## 2017-10-06 DIAGNOSIS — S134XXA Sprain of ligaments of cervical spine, initial encounter: Secondary | ICD-10-CM | POA: Diagnosis not present

## 2017-10-06 DIAGNOSIS — M9901 Segmental and somatic dysfunction of cervical region: Secondary | ICD-10-CM | POA: Diagnosis not present

## 2017-10-06 DIAGNOSIS — M9902 Segmental and somatic dysfunction of thoracic region: Secondary | ICD-10-CM | POA: Diagnosis not present

## 2017-10-06 DIAGNOSIS — M9903 Segmental and somatic dysfunction of lumbar region: Secondary | ICD-10-CM | POA: Diagnosis not present

## 2017-10-06 NOTE — Telephone Encounter (Signed)
Wife notified and verbalized understanding. Routed to PCP

## 2017-10-06 NOTE — Telephone Encounter (Signed)
-----   Message from Arnoldo Lenis, MD sent at 10/05/2017  1:44 PM EDT ----- Stress test is overall looks good. Small area of the tip of the heart that may represent a very small blockage, this could cause some symptoms but is considered low risk given how small it is. We will discuss in detail at our upcoming f/u  Zandra Abts MD

## 2017-10-19 DIAGNOSIS — M9901 Segmental and somatic dysfunction of cervical region: Secondary | ICD-10-CM | POA: Diagnosis not present

## 2017-10-19 DIAGNOSIS — M9902 Segmental and somatic dysfunction of thoracic region: Secondary | ICD-10-CM | POA: Diagnosis not present

## 2017-10-19 DIAGNOSIS — S134XXA Sprain of ligaments of cervical spine, initial encounter: Secondary | ICD-10-CM | POA: Diagnosis not present

## 2017-10-19 DIAGNOSIS — S338XXA Sprain of other parts of lumbar spine and pelvis, initial encounter: Secondary | ICD-10-CM | POA: Diagnosis not present

## 2017-10-19 DIAGNOSIS — M9903 Segmental and somatic dysfunction of lumbar region: Secondary | ICD-10-CM | POA: Diagnosis not present

## 2017-10-19 DIAGNOSIS — S233XXA Sprain of ligaments of thoracic spine, initial encounter: Secondary | ICD-10-CM | POA: Diagnosis not present

## 2017-11-07 ENCOUNTER — Ambulatory Visit (INDEPENDENT_AMBULATORY_CARE_PROVIDER_SITE_OTHER): Payer: Medicare Other | Admitting: Cardiology

## 2017-11-07 ENCOUNTER — Encounter: Payer: Self-pay | Admitting: Cardiology

## 2017-11-07 VITALS — BP 98/60 | HR 92 | Ht 75.0 in | Wt 172.6 lb

## 2017-11-07 DIAGNOSIS — I6523 Occlusion and stenosis of bilateral carotid arteries: Secondary | ICD-10-CM | POA: Diagnosis not present

## 2017-11-07 DIAGNOSIS — I2510 Atherosclerotic heart disease of native coronary artery without angina pectoris: Secondary | ICD-10-CM | POA: Diagnosis not present

## 2017-11-07 NOTE — Patient Instructions (Signed)

## 2017-11-07 NOTE — Progress Notes (Signed)
Clinical Summary Joseph Forbes is a 77 y.o.male seen today for follow up, this is a focused visit on recent symptoms of chest pain and recent stress test results.   1. CAD: prior stents in 2000 and 2001. Last cath 2008 w/ nonobstructive disease. Normal LVEF 65% by echo 2012. - nuclear stress 03/2015 which showed no specific ischemia.   - had reported recent episodes of chest pain.  09/2017 dobutmaine nuclear stress inferoseptal scar, small apical infarct with mild peri-infarct ischemia. Low risk study.  - no recurrent chest pain since last visit. No significant SOB/DOE.        Past Medical History:  Diagnosis Date  . CAD (coronary artery disease)    Normal LV function .Status post stent placement to the right coronary artery in 2000 and 2001Obstructive coronary artery disease in November 2008 by catheterization   . Carotid artery disease ([place])   . COPD (chronic obstructive pulmonary disease) ([place])   . GERD (gastroesophageal reflux disease)   . Hyperlipidemia   . Hypertension    pt denies, no meds     Allergies  Allergen Reactions  . Clarithromycin Nausea Only  . Other Itching, Rash and Other (See Comments)    Some medication OTC he took for headache caused a rash     Current Outpatient Medications  Medication Sig Dispense Refill  . aspirin 81 MG tablet Take 1 tablet (81 mg total) by mouth daily. 30 tablet   . atorvastatin (LIPITOR) 80 MG tablet TAKE ONE TABLET BY MOUTH DAILY 15 tablet 0  . nitroGLYCERIN (NITROSTAT) 0.4 MG SL tablet Place 0.4 mg under the tongue every 5 (five) minutes as needed for chest pain.     Marland Kitchen PROAIR HFA 108 (90 Base) MCG/ACT inhaler Inhale 2 puffs into the lungs every 6 (six) hours as needed for wheezing or shortness of breath.      No current facility-administered medications for this visit.      Past Surgical History:  Procedure Laterality Date  . CARDIAC CATHETERIZATION  02/2007  . cardiac stents     2 stents placed   . CAROTID  ENDARTERECTOMY Bilateral 2010      ([facility])  CE  . COLONOSCOPY N/A 05/23/2017   Procedure: COLONOSCOPY;  Surgeon: Rogene Houston, MD;  [facility];  Service: Endoscopy;  Laterality: N/A;  . ESOPHAGOGASTRODUODENOSCOPY N/A 05/23/2017   Procedure: ESOPHAGOGASTRODUODENOSCOPY (EGD);  Surgeon: Rogene Houston, MD;  [facility];  Service: Endoscopy;  Laterality: N/A;  . FRACTURE SURGERY     right arm  . HYDROCELE EXCISION Right 04/24/2013   Procedure: HYDROCELECTOMY ADULT;  Surgeon: Hanley Ben, MD;  Location[facility];  Service: Urology;  Laterality: Right;  . JOINT REPLACEMENT Left Nov. 3, 2015   Hip  . TOTAL HIP ARTHROPLASTY Left 02/19/2014   Procedure: TOTAL HIP ARTHROPLASTY;  Surgeon: Garald Balding, MD;  Location: [place];  Service: Orthopedics;  Laterality: Left;     Allergies  Allergen Reactions  . Clarithromycin Nausea Only  . Other Itching, Rash and Other (See Comments)    Some medication OTC he took for headache caused a rash      Family History  Problem Relation Age of Onset  . Heart disease Mother        Before age 41  . Heart disease Father   . Heart attack Father   . Colon cancer Neg Hx      Social History Joseph Forbes  reports that he has been smoking cigarettes.  He started smoking about 64 years ago. He has a 55.00 pack-year smoking history. His smokeless tobacco use includes chew. Joseph Forbes reports that he does not drink alcohol.   Review of Systems CONSTITUTIONAL: No weight loss, fever, chills, weakness or fatigue.  HEENT: Eyes: No visual loss, blurred vision, double vision or yellow sclerae.No hearing loss, sneezing, congestion, runny nose or sore throat.  SKIN: No rash or itching.  CARDIOVASCULAR: per hpi RESPIRATORY: No shortness of breath, cough or sputum.  GASTROINTESTINAL: No anorexia, nausea, vomiting or diarrhea. No abdominal pain or blood.  GENITOURINARY: No burning on urination, no  polyuria NEUROLOGICAL: No headache, dizziness, syncope, paralysis, ataxia, numbness or tingling in the extremities. No change in bowel or bladder control.  MUSCULOSKELETAL: No muscle, back pain, joint pain or stiffness.  LYMPHATICS: No enlarged nodes. No history of splenectomy.  PSYCHIATRIC: No history of depression or anxiety.  ENDOCRINOLOGIC: No reports of sweating, cold or heat intolerance. No polyuria or polydipsia.  Marland Kitchen   Physical Examination Vitals:   11/07/17 1120  BP: 98/60  Pulse: 92  SpO2: 94%   Vitals:   11/07/17 1120  Weight: 172 lb 9.6 oz (78.3 kg)  Height: 6\' 3"  (1.905 m)    Gen: resting comfortably, no acute distress HEENT: no scleral icterus, pupils equal round and reactive, no palptable cervical adenopathy,  CV: RRR, no m/r/g, no jvd Resp: Clear to auscultation bilaterally GI: abdomen is soft, non-tender, non-distended, normal bowel sounds, no hepatosplenomegaly MSK: extremities are warm, no edema.  Skin: warm, no rash Neuro:  no focal deficits Psych: appropriate affect   Diagnostic Studies 12/12 DSE: Baseline LVEF 55-60%. Negative for ischemia.   07/2012 Carotids: patent bilaterally w/ minimal plaque/hyperplasia, vertebals normal bilateral   01/02/13 EKG: SR, normal axis, no ischemic changes  03/2015 Dobutamine nuclear stress  Defect 1: There is a small defect of moderate severity present in the basal inferoseptal, mid inferoseptal, apical septal and apical inferior location. This is most likely due to soft tissue attenuation artifact. However, myocardial scar cannot entirely be ruled out.  This is a low risk study. No ischemic territories.  Nuclear stress EF: 58%.   09/2017 Dobutamine nuclear stress  There was no ST segment deviation noted during stress.  Findings consistent with prior inferoseptal infarct without current ischemia. Small apical infarct with mild peri-infarct ischemia.  This is a low risk study.  The left ventricular ejection  fraction is normal (55-65%).     Assessment and Plan  1. CAD - recent stress test low risk, overall mild apical ischemia. Symptoms have resolved - continue medical therapy, adjust antianginal therapy if recurrence of symptoms. If refractory consider cath at that time.     F/u 6 months     Arnoldo Lenis, M.D.

## 2017-11-16 ENCOUNTER — Encounter: Payer: Self-pay | Admitting: Cardiology

## 2017-12-21 ENCOUNTER — Encounter (INDEPENDENT_AMBULATORY_CARE_PROVIDER_SITE_OTHER): Payer: Self-pay | Admitting: Orthopaedic Surgery

## 2017-12-21 ENCOUNTER — Ambulatory Visit (INDEPENDENT_AMBULATORY_CARE_PROVIDER_SITE_OTHER): Payer: Medicare Other

## 2017-12-21 ENCOUNTER — Ambulatory Visit (INDEPENDENT_AMBULATORY_CARE_PROVIDER_SITE_OTHER): Payer: Medicare Other | Admitting: Orthopaedic Surgery

## 2017-12-21 VITALS — BP 116/60 | HR 77 | Ht 75.0 in | Wt 184.0 lb

## 2017-12-21 DIAGNOSIS — I6523 Occlusion and stenosis of bilateral carotid arteries: Secondary | ICD-10-CM

## 2017-12-21 DIAGNOSIS — M25511 Pain in right shoulder: Secondary | ICD-10-CM

## 2017-12-21 MED ORDER — METHYLPREDNISOLONE ACETATE 40 MG/ML IJ SUSP
80.0000 mg | INTRAMUSCULAR | Status: AC | PRN
  Administered 2017-12-21: 80 mg

## 2017-12-21 MED ORDER — BUPIVACAINE HCL 0.5 % IJ SOLN
2.0000 mL | INTRAMUSCULAR | Status: AC | PRN
  Administered 2017-12-21: 2 mL via INTRA_ARTICULAR

## 2017-12-21 MED ORDER — LIDOCAINE HCL 2 % IJ SOLN
2.0000 mL | INTRAMUSCULAR | Status: AC | PRN
  Administered 2017-12-21: 2 mL

## 2017-12-21 NOTE — Progress Notes (Signed)
Office Visit Note   Patient: Joseph Forbes           Date of Birth: Sep 05, 1940           MRN: [id number] Visit Date: 12/21/2017              Requested by: Deloria Lair., MD [place], [place] [id number] PCP: Deloria Lair., MD   Assessment & Plan: Visit Diagnoses:  1. Acute pain of right shoulder     Plan: Possible rotator cuff tear.  Will inject subacromial space with cortisone and monitor response.  If no improvement over the next 3 to 4 weeks will consider MRI scan.  Discussion regarding possible diagnoses and treatment options lasting approximately half an hour 50% of the time in counseling Follow-Up Instructions: Return in about 3 weeks (around 01/11/2018).   Orders:  Orders Placed This Encounter  Procedures  . Large Joint Inj: R subacromial bursa  . XR Shoulder Right   No orders of the defined types were placed in this encounter.     Procedures: Large Joint Inj: R subacromial bursa on 12/21/2017 1:36 PM Indications: pain and diagnostic evaluation Details: 25 G 1.5 in needle, anterolateral approach  Arthrogram: No  Medications: 2 mL lidocaine 2 %; 2 mL bupivacaine 0.5 %; 80 mg methylPREDNISolone acetate 40 MG/ML Consent was given by the patient. Immediately prior to procedure a time out was called to verify the correct patient, procedure, equipment, support staff and site/side marked as required. Patient was prepped and draped in the usual sterile fashion.       Clinical Data: No additional findings.   Subjective: Chief Complaint  Patient presents with  . New Patient (Initial Visit)    r shoulder pain for 1 mo,  no injury. unable to reach arm back  Insidious onset of right shoulder pain approximately 1 month ago without injury or trauma.  Has persistent difficulty raising his arm over his and sleeping on that side.  Has experienced "grating".  No referred pain to the hand or forearm.  No neck discomfort.  HPI  Review of Systems    Constitutional: Negative for fatigue and fever.  HENT: Negative for ear pain.   Eyes: Negative for pain.  Respiratory: Positive for shortness of breath. Negative for cough.   Cardiovascular: Negative for leg swelling.  Gastrointestinal: Negative for constipation and diarrhea.  Genitourinary: Negative for difficulty urinating.  Musculoskeletal: Positive for back pain. Negative for neck pain.  Skin: Negative for rash.  Allergic/Immunologic: Negative for food allergies.  Neurological: Positive for weakness. Negative for numbness.  Hematological: Does not bruise/bleed easily.  Psychiatric/Behavioral: Positive for sleep disturbance.     Objective: Vital Signs: BP 116/60 (BP Location: Right Arm, Patient Position: Sitting, Cuff Size: Normal)   Pulse 77   Ht 6\' 3"  (1.905 m)   Wt 184 lb (83.5 kg)   BMI 23.00 kg/m   Physical Exam  Constitutional: He is oriented to person, place, and time. He appears well-developed and well-nourished.  HENT:  Mouth/Throat: Oropharynx is clear and moist.  Eyes: Pupils are equal, round, and reactive to light. EOM are normal.  Pulmonary/Chest: Effort normal.  Neurological: He is alert and oriented to person, place, and time.  Skin: Skin is warm and dry.  Psychiatric: He has a normal mood and affect. His behavior is normal.    Ortho Exam right shoulder with obvious grating with internal/external rotation.  I am able to passively place his arm in  about 140 degrees of flexion.  He could not perform that actively.  He could maintain that position once it was at 140 degrees.  He could abduct 90 degrees actively.  Considerable discomfort with internal/external rotation.  No distal edema.  Good grip and good release. Significant improvement in pain and range of motion after the injection without grating   Imaging: Xr Shoulder Right  Result Date: 12/21/2017 Films of the right shoulder obtained in several projections.  There is very slight superior migration of  the humeral head.  No ectopic calcification.  Mild degenerative changes of the acromioclavicular joint.  Normal space between the humeral head and the acromium    PMFS History: Patient Active Problem List   Diagnosis Date Noted  . Melena 05/18/2017  . Abnormal CT of the abdomen 05/18/2017  . Low back pain with sciatica 11/03/2016  . Compression fracture   . 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 Osteoarthritis of left hip 02/21/2014  . S/P total hip arthroplasty 02/19/2014  . Aftercare following surgery of the circulatory system, [place] 08/16/2013  . Occlusion and stenosis of carotid artery without mention of cerebral infarction 07/30/2011  . DYSLIPIDEMIA 08/25/2009  . TOBACCO ABUSE 08/25/2009  . CAD (coronary artery disease), native coronary artery 08/25/2009  . PVD 08/25/2009  . CAROTID ENDARTERECTOMY, BILATERAL, HX OF 08/25/2009   Past Medical History:  Diagnosis Date  . CAD (coronary artery disease)    Normal LV function .Status post stent placement to the right coronary artery in 2000 and 2001Obstructive coronary artery disease in November 2008 by catheterization   . Carotid artery disease ([place])   . COPD (chronic obstructive pulmonary disease) ([place])   . GERD (gastroesophageal reflux disease)   . Hyperlipidemia   . Hypertension    pt denies, no meds    Family History  Problem Relation Age of Onset  . Heart disease Mother        Before age 54  . Heart disease Father   . Heart attack Father   . Colon cancer Neg Hx     Past Surgical History:  Procedure Laterality Date  . CARDIAC CATHETERIZATION  02/2007  . cardiac stents     2 stents placed   . CAROTID ENDARTERECTOMY Bilateral 2010      ([facility])  CE  . COLONOSCOPY N/A 05/23/2017   Procedure: COLONOSCOPY;  Surgeon: Rogene Houston, MD;  [facility];  Service: Endoscopy;  Laterality: N/A;  . ESOPHAGOGASTRODUODENOSCOPY N/A 05/23/2017   Procedure: ESOPHAGOGASTRODUODENOSCOPY  (EGD);  Surgeon: Rogene Houston, MD;  [facility];  Service: Endoscopy;  Laterality: N/A;  . FRACTURE SURGERY     right arm  . HYDROCELE EXCISION Right 04/24/2013   Procedure: HYDROCELECTOMY ADULT;  Surgeon: Hanley Ben, MD;  Location: [facility];  Service: Urology;  Laterality: Right;  . JOINT REPLACEMENT Left Nov. 3, 2015   Hip  . TOTAL HIP ARTHROPLASTY Left 02/19/2014   Procedure: TOTAL HIP ARTHROPLASTY;  Surgeon: Garald Balding, MD;  Location: [place];  Service: Orthopedics;  Laterality: Left;   Social History   Occupational History  . Not on file  Tobacco Use  . Smoking status: Current Every Day Smoker    Packs/day: 1.00    Years: 55.00    Pack years: 55.00    Types: Cigarettes    Start date: 06/19/1953  . Smokeless tobacco: Current User    Types: Chew  Substance and Sexual Activity  .  Alcohol use: No    Alcohol/week: 0.0 standard drinks  . Drug use: No  . Sexual activity: Not on file

## 2017-12-23 ENCOUNTER — Other Ambulatory Visit: Payer: Self-pay | Admitting: Cardiology

## 2017-12-23 MED ORDER — NITROGLYCERIN 0.4 MG SL SUBL: SUBLINGUAL_TABLET | SUBLINGUAL | 3 refills | Status: DC

## 2017-12-23 NOTE — Telephone Encounter (Signed)
Patient's wife called requesting refill on nitroGLYCERIN (NITROSTAT) 0.4 MG .  Marland Kitchen States that around 200pm today he stated having chest pains.  Took 1 nitroGlycerin Tab. He only has one left. She states that he is not having any other symptoms.  [employer]

## 2017-12-23 NOTE — Telephone Encounter (Signed)
Spoke with pt who says he had chest pain for a few minutes and took 1 NTG and was resolved says this was earlier today - refill sent to [facility] - pt denies any active chest pain/SOB/dizziness - denies any symptoms at this time. Pt aware that if chest pt comes back and NTG doesn't resolve pain or if other symptoms develop to be evaluated at [facility] - pt voiced understanding

## 2018-02-18 DIAGNOSIS — Z23 Encounter for immunization: Secondary | ICD-10-CM | POA: Diagnosis not present

## 2018-03-21 ENCOUNTER — Ambulatory Visit: Payer: Medicare Other | Admitting: Family

## 2018-03-21 ENCOUNTER — Inpatient Hospital Stay (HOSPITAL_COMMUNITY): Admission: RE | Admit: 2018-03-21 | Payer: Medicare Other | Source: Ambulatory Visit

## 2018-03-28 ENCOUNTER — Ambulatory Visit (INDEPENDENT_AMBULATORY_CARE_PROVIDER_SITE_OTHER): Payer: Medicare Other | Admitting: Cardiology

## 2018-03-28 ENCOUNTER — Encounter

## 2018-03-28 ENCOUNTER — Encounter: Payer: Self-pay | Admitting: Cardiology

## 2018-03-28 VITALS — BP 114/64 | HR 70 | Ht 75.0 in | Wt 179.8 lb

## 2018-03-28 DIAGNOSIS — I6523 Occlusion and stenosis of bilateral carotid arteries: Secondary | ICD-10-CM | POA: Diagnosis not present

## 2018-03-28 DIAGNOSIS — E782 Mixed hyperlipidemia: Secondary | ICD-10-CM | POA: Diagnosis not present

## 2018-03-28 DIAGNOSIS — I2510 Atherosclerotic heart disease of native coronary artery without angina pectoris: Secondary | ICD-10-CM | POA: Diagnosis not present

## 2018-03-28 NOTE — Progress Notes (Signed)
Clinical Summary Joseph Forbes is a 77 y.o.male seen today for follow up of the following medical problems.  1. CAD: prior stents in 2000 and 2001. Last cath 2008 w/ nonobstructive disease. Normal LVEF 65% by echo 2012. - nuclear stress 03/2015 which showed no specific ischemia.  09/2017 dobutmaine nuclear stress inferoseptal scar, small apical infarct with mild peri-infarct ischemia. Low risk study.   - some recent chest pain - episode Sunday watching tv. Sharp pain across chest, 8-9/10. No other associated symptoms. Not positional. Pain lasted about 2 minutes, took NG. 2 episodes over the 6 weeks. - can have some SOB/DOE which is stable, better with inhaler.  - looks like imdur stopped after ER visit Jan 2019 with GI bleed .   2. Carotid stenosis  - prior bilateral carotid endarterectomies, recent US shows good patency. Followed by vascular - no recent neuro symptoms.   3. HL 09/2017 TC 136 TG 87 HDL 52 LDL 68 - compliant with statin   4. 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 - he is compliant with inhalers   Past Medical History:  Diagnosis Date  . CAD (coronary artery disease)    Normal LV function .Status post stent placement to the right coronary artery in 2000 and 2001Obstructive coronary artery disease in November 2008 by catheterization   . Carotid artery disease ([place])   . COPD (chronic obstructive pulmonary disease) ([place])   . GERD (gastroesophageal reflux disease)   . Hyperlipidemia   . Hypertension    pt denies, no meds     Allergies  Allergen Reactions  . Clarithromycin Nausea Only  . Other Itching, Rash and Other (See Comments)    Some medication OTC he took for headache caused a rash     Current Outpatient Medications  Medication Sig Dispense Refill  . aspirin 81 MG tablet Take 1 tablet (81 mg total) by mouth daily. 30 tablet   . atorvastatin (LIPITOR) 80 MG tablet TAKE ONE TABLET BY MOUTH DAILY 15 tablet 0  . nitroGLYCERIN (NITROSTAT)  0.4 MG SL tablet DISSOLVE 1 TABLET BY MOUTH UNDER TONGUE EVERY 5 MINUTES AS NEEDED FOR CHEST PAIN. DO NOT EXCEED A TOTAL OF 3 DOSES IN 15 MIN 25 tablet 3  . PROAIR HFA 108 (90 Base) MCG/ACT inhaler Inhale 2 puffs into the lungs every 6 (six) hours as needed for wheezing or shortness of breath.      No current facility-administered medications for this visit.      Past Surgical History:  Procedure Laterality Date  . CARDIAC CATHETERIZATION  02/2007  . cardiac stents     2 stents placed   . CAROTID ENDARTERECTOMY Bilateral 2010      ([facility])  CE  . COLONOSCOPY N/A 05/23/2017   Procedure: COLONOSCOPY;  Surgeon: Rogene Houston, MD;  [facility];  Service: Endoscopy;  Laterality: N/A;  . ESOPHAGOGASTRODUODENOSCOPY N/A 05/23/2017   Procedure: ESOPHAGOGASTRODUODENOSCOPY (EGD);  Surgeon: Rogene Houston, MD;  [facility];  Service: Endoscopy;  Laterality: N/A;  . FRACTURE SURGERY     right arm  . HYDROCELE EXCISION Right 04/24/2013   Procedure: HYDROCELECTOMY ADULT;  Surgeon: Hanley Ben, MD;  Location: [facility];  Service: Urology;  Laterality: Right;  . JOINT REPLACEMENT Left Nov. 3, 2015   Hip  . TOTAL HIP ARTHROPLASTY Left 02/19/2014   Procedure: TOTAL HIP ARTHROPLASTY;  Surgeon: Garald Balding, MD;  Location: [place];  Service: Orthopedics;  Laterality:  Left;     Allergies  Allergen Reactions  . Clarithromycin Nausea Only  . Other Itching, Rash and Other (See Comments)    Some medication OTC he took for headache caused a rash      Family History  Problem Relation Age of Onset  . Heart disease Mother        Before age 67  . Heart disease Father   . Heart attack Father   . Colon cancer Neg Hx      Social History Joseph Forbes reports that he has been smoking cigarettes. He started smoking about 64 years ago. He has a 55.00 pack-year smoking history. His smokeless tobacco use includes chew. Joseph Forbes reports that he does not  drink alcohol.   Review of Systems CONSTITUTIONAL: No weight loss, fever, chills, weakness or fatigue.  HEENT: Eyes: No visual loss, blurred vision, double vision or yellow sclerae.No hearing loss, sneezing, congestion, runny nose or sore throat.  SKIN: No rash or itching.  CARDIOVASCULAR: per hpi RESPIRATORY: No shortness of breath, cough or sputum.  GASTROINTESTINAL: No anorexia, nausea, vomiting or diarrhea. No abdominal pain or blood.  GENITOURINARY: No burning on urination, no polyuria NEUROLOGICAL: No headache, dizziness, syncope, paralysis, ataxia, numbness or tingling in the extremities. No change in bowel or bladder control.  MUSCULOSKELETAL: No muscle, back pain, joint pain or stiffness.  LYMPHATICS: No enlarged nodes. No history of splenectomy.  PSYCHIATRIC: No history of depression or anxiety.  ENDOCRINOLOGIC: No reports of sweating, cold or heat intolerance. No polyuria or polydipsia.  Marland Kitchen   Physical Examination Vitals:   03/28/18 1132  BP: 114/64  Pulse: 70  SpO2: 96%   Vitals:   03/28/18 1132  Weight: 179 lb 12.8 oz (81.6 kg)  Height: 6\' 3"  (1.905 m)    Gen: resting comfortably, no acute distress HEENT: no scleral icterus, pupils equal round and reactive, no palptable cervical adenopathy,  CV: RRR, no m/r/g, no jvd Resp: Clear to auscultation bilaterally GI: abdomen is soft, non-tender, non-distended, normal bowel sounds, no hepatosplenomegaly MSK: extremities are warm, no edema.  Skin: warm, no rash Neuro:  no focal deficits Psych: appropriate affect   Diagnostic Studies 12/12 DSE: Baseline LVEF 55-60%. Negative for ischemia.   07/2012 Carotids: patent bilaterally w/ minimal plaque/hyperplasia, vertebals normal bilateral   01/02/13 EKG: SR, normal axis, no ischemic changes  03/2015 Dobutamine nuclear stress  Defect 1: There is a small defect of moderate severity present in the basal inferoseptal, mid inferoseptal, apical septal and apical inferior  location. This is most likely due to soft tissue attenuation artifact. However, myocardial scar cannot entirely be ruled out.  This is a low risk study. No ischemic territories.  Nuclear stress EF: 58%.   09/2017 Dobutamine nuclear stress  There was no ST segment deviation noted during stress.  Findings consistent with prior inferoseptal infarct without current ischemia. Small apical infarct with mild peri-infarct ischemia.  This is a low risk study.  The left ventricular ejection fraction is normal (55-65%).    Assessment and Plan  1. CAD -recent stress test low risk, overall mild apical ischemia.  - occasional chest pain symptoms. Not completely clear what happened to his imdur, appears it may have been stopped during Jan 2019 ER visit with GI bleed - restart imdur 15mg  daily, adjust antianginal therapy as needed for symptoms. If refractory would consider cath at that time - he is waiting for additional insurance at the beignning of the year and wishes to wait to fill  imdur until then, he will call us when his insurance is in place   2. Carotid stenosis - continue to follow with vascular - continue medical therapy.   3. Hyperlipidemia - at goal, continue statin  F/u 4 months.   Arnoldo Lenis, M.D

## 2018-03-28 NOTE — Patient Instructions (Signed)
Your physician recommends that you schedule a follow-up appointment in: [place] has recommended you make the following change in your medication:   START IMDUR 15 MG DAILY - PLEASE CALL us WHEN YOU ARE READY FOR [place] TO SENT THIS MEDICATION IN   Thank you for choosing [facility]!!

## 2018-04-05 ENCOUNTER — Telehealth (HOSPITAL_COMMUNITY): Payer: Self-pay | Admitting: Surgery

## 2018-04-05 NOTE — Telephone Encounter (Signed)
Attempted to contact patient to confirm appointments scheduled 04/06/2018 [facility]

## 2018-04-06 ENCOUNTER — Encounter: Payer: Self-pay | Admitting: Family

## 2018-04-06 ENCOUNTER — Ambulatory Visit (HOSPITAL_COMMUNITY)
Admission: RE | Admit: 2018-04-06 | Discharge: 2018-04-06 | Disposition: A | Discharge status: Home / Self Care | Payer: Medicare Other | Source: Ambulatory Visit | Attending: Family | Admitting: Family

## 2018-04-06 ENCOUNTER — Ambulatory Visit (INDEPENDENT_AMBULATORY_CARE_PROVIDER_SITE_OTHER): Payer: Medicare Other | Admitting: Family

## 2018-04-06 VITALS — BP 131/66 | HR 75 | Temp 96.9°F | Resp 16 | Ht 75.0 in | Wt 174.0 lb

## 2018-04-06 DIAGNOSIS — I6523 Occlusion and stenosis of bilateral carotid arteries: Secondary | ICD-10-CM | POA: Insufficient documentation

## 2018-04-06 DIAGNOSIS — Z9889 Other specified postprocedural states: Secondary | ICD-10-CM | POA: Diagnosis not present

## 2018-04-06 DIAGNOSIS — F17200 Nicotine dependence, unspecified, uncomplicated: Secondary | ICD-10-CM | POA: Diagnosis not present

## 2018-04-06 NOTE — Progress Notes (Signed)
Chief Complaint: Follow up Extracranial Carotid Artery Stenosis   History of Present Illness  Joseph Forbes is a 77 y.o. male who is status post bilateral carotid endarterectomies in 2010 by Dr. Kellie Simmering. He returns today for routine surveillance.  He denies any history of TIA or stroke symptoms.Specifically he denies a history of amaurosis fugax or monocular blindness, unilateral facial drooping, hemiplegia/hemiparesis, or receptive or expressive aphasia.   Pt denies claudication type symptoms in his legs with walking. 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  Diabetic: no Tobacco use: smoker (1 ppd , started at age 39 yrs)  Pt meds include: Statin : Yes ASA: Yes Other anticoagulants/antiplatelets: no    Past Medical History:  Diagnosis Date  . CAD (coronary artery disease)    Normal LV function .Status post stent placement to the right coronary artery in 2000 and 2001Obstructive coronary artery disease in November 2008 by catheterization   . Carotid artery disease ([place])   . COPD (chronic obstructive pulmonary disease) ([place])   . GERD (gastroesophageal reflux disease)   . Hyperlipidemia   . Hypertension    pt denies, no meds    Social History Social History   Tobacco Use  . Smoking status: Current Every Day Smoker    Packs/day: 1.00    Years: 55.00    Pack years: 55.00    Types: Cigarettes    Start date: 06/19/1953  . Smokeless tobacco: Current User    Types: Chew  Substance Use Topics  . Alcohol use: No    Alcohol/week: 0.0 standard drinks  . Drug use: No    Family History Family History  Problem Relation Age of Onset  . Heart disease Mother        Before age 9  . Heart disease Father   . Heart attack Father   . Colon cancer Neg Hx     Surgical History Past Surgical History:  Procedure Laterality Date  . CARDIAC CATHETERIZATION  02/2007  . cardiac stents     2 stents placed   . CAROTID ENDARTERECTOMY Bilateral 2010   ([facility])  CE  . COLONOSCOPY N/A 05/23/2017   Procedure: COLONOSCOPY;  Surgeon: Rogene Houston, MD;  [facility];  Service: Endoscopy;  Laterality: N/A;  . ESOPHAGOGASTRODUODENOSCOPY N/A 05/23/2017   Procedure: ESOPHAGOGASTRODUODENOSCOPY (EGD);  Surgeon: Rogene Houston, MD;  [facility];  Service: Endoscopy;  Laterality: N/A;  . FRACTURE SURGERY     right arm  . HYDROCELE EXCISION Right 04/24/2013   Procedure: HYDROCELECTOMY ADULT;  Surgeon: Hanley Ben, MD;  Location: [facility];  Service: Urology;  Laterality: Right;  . JOINT REPLACEMENT Left Nov. 3, 2015   Hip  . TOTAL HIP ARTHROPLASTY Left 02/19/2014   Procedure: TOTAL HIP ARTHROPLASTY;  Surgeon: Garald Balding, MD;  Location: [place];  Service: Orthopedics;  Laterality: Left;    Allergies  Allergen Reactions  . Clarithromycin Nausea Only  . Other Itching, Rash and Other (See Comments)    Some medication OTC he took for headache caused a rash    Current Outpatient Medications  Medication Sig Dispense Refill  . aspirin 81 MG tablet Take 1 tablet (81 mg total) by mouth daily. 30 tablet   . atorvastatin (LIPITOR) 80 MG tablet TAKE ONE TABLET BY MOUTH DAILY 15 tablet 0  . nitroGLYCERIN (NITROSTAT) 0.4 MG SL tablet DISSOLVE 1 TABLET BY MOUTH UNDER TONGUE EVERY 5 MINUTES AS NEEDED FOR  CHEST PAIN. DO NOT EXCEED A TOTAL OF 3 DOSES IN 15 MIN 25 tablet 3  . pantoprazole (PROTONIX) 40 MG tablet Take 1 tablet by mouth daily.  5  . PROAIR HFA 108 (90 Base) MCG/ACT inhaler Inhale 2 puffs into the lungs every 6 (six) hours as needed for wheezing or shortness of breath.      No current facility-administered medications for this visit.     Review of Systems : See HPI for pertinent positives and negatives.  Physical Examination  Vitals:   04/06/18 1315 04/06/18 1318  BP: 120/60 131/66  Pulse: 75   Resp: 16   Temp: (!) 96.9 F (36.1 C)   SpO2: 96%   Weight: 174 lb (78.9 kg)   Height: 6\' 3"   (1.905 m)    Body mass index is 21.75 kg/m.  General: WDWN male in NAD GAIT: normal Eyes: PERRLA HENT: No gross abnormalities.  Pulmonary:  Respirations are non-labored, fair air movement in all fields, no rales, rhonchi, or wheezes. Occasional moist cough. Cardiac: regular rhythm, no detected murmur.  VASCULAR EXAM Carotid Bruits Right Left   Negative Negative     Abdominal aortic pulse is not palpable. Radial pulses are 2+ palpable and equal.                                                                                                                            LE Pulses Right Left       POPLITEAL  not palpable   not palpable       POSTERIOR TIBIAL  1+ palpable   2+ palpable        DORSALIS PEDIS      ANTERIOR TIBIAL 1+ palpable  2+ palpable     Gastrointestinal: soft, nontender, BS WNL, no r/g, no palpable masses. Musculoskeletal: no muscle atrophy/wasting. M/S 5/5 throughout, extremities without ischemic changes. Mild to moderate thoracic and cervical kyphosis. Skin: No rashes, no ulcers, no cellulitis.   Neurologic:  A&O X 3; appropriate affect, sensation is normal; speech is normal, CN 2-12 intact, pain and light touch intact in extremities, motor exam as listed above. Psychiatric: Normal thought content, mood appropriate to clinical situation.    Assessment: Joseph Forbes is a 77 y.o. male whois status post bilateral carotid endarterectomies in 2010. He has no history of stroke or TIA.  Fortunately he does not have DM. His primary atherosclerotic risk factor remains active smoking, currently a ppd.   He takes a daily ASA and a statin.   He states that Dr. Quintin Alto on [facility] in [place] is his new PCP since Dr. Scotty Court retired.   DATA Carotid Duplex (04-06-18): Right Carotid: Velocities in the right ICA are consistent with a 1-39% stenosis.         Non-hemodynamically significant plaque <50% noted in the CCA. The         ECA appears <50%  stenosed. Patent endarterectomy with  heterogenous plaque formation without evidence of restenosis.    Left Carotid: Velocities in the left ICA are consistent with a 1-39% stenosis.        Non-hemodynamically significant plaque noted in the CCA. The ECA        appears <50% stenosed. Patent endarterectomy with heterogenous        plaque formation without evidence of restenosis.    Vertebrals: Bilateral vertebral arteries demonstrate antegrade flow.  Subclavians: Bilateral subclavian artery flow was disturbed.  No significant change compared to the exams on 09-09-15 and 09-14-16.     Plan: Follow-up in 18 months with Carotid Duplex scan. The patient was counseled re smoking cessation and given several free resources re smoking cessation.   I discussed in depth with the patient the nature of atherosclerosis, and emphasized the importance of maximal medical management including strict control of blood pressure, blood glucose, and lipid levels, obtaining regular exercise, and cessation of smoking.  The patient is aware that without maximal medical management the underlying atherosclerotic disease process will progress, limiting the benefit of any interventions. The patient was given information about stroke prevention and what symptoms should prompt the patient to seek immediate medical care. Thank you for allowing [place] to participate in this patient's care.  Clemon Chambers, RN, MSN, FNP-C [facility] Office: [phone]  Clinic Physician: Oneida Alar  04/06/18 1:21 PM

## 2018-04-06 NOTE — Patient Instructions (Signed)
Steps to Quit Smoking  Smoking tobacco can be bad for your health. It can also affect almost every organ in your body. Smoking puts you and people around you at risk for many serious long-lasting (chronic) diseases. Quitting smoking is hard, but it is one of the best things that you can do for your health. It is never too late to quit. What are the benefits of quitting smoking? When you quit smoking, you lower your risk for getting serious diseases and conditions. They can include:  Lung cancer or lung disease.  Heart disease.  Stroke.  Heart attack.  Not being able to have children (infertility).  Weak bones (osteoporosis) and broken bones (fractures). If you have coughing, wheezing, and shortness of breath, those symptoms may get better when you quit. You may also get sick less often. If you are pregnant, quitting smoking can help to lower your chances of having a baby of low birth weight. What can I do to help me quit smoking? Talk with your doctor about what can help you quit smoking. Some things you can do (strategies) include:  Quitting smoking totally, instead of slowly cutting back how much you smoke over a period of time.  Going to in-person counseling. You are more likely to quit if you go to many counseling sessions.  Using resources and support systems, such as: ? Online chats with a counselor. ? Phone quitlines. ? Printed self-help materials. ? Support groups or group counseling. ? Text messaging programs. ? Mobile phone apps or applications.  Taking medicines. Some of these medicines may have nicotine in them. If you are pregnant or breastfeeding, do not take any medicines to quit smoking unless your doctor says it is okay. Talk with your doctor about counseling or other things that can help you. Talk with your doctor about using more than one strategy at the same time, such as taking medicines while you are also going to in-person counseling. This can help make  quitting easier. What things can I do to make it easier to quit? Quitting smoking might feel very hard at first, but there is a lot that you can do to make it easier. Take these steps:  Talk to your family and friends. Ask them to support and encourage you.  Call phone quitlines, reach out to support groups, or work with a counselor.  Ask people who smoke to not smoke around you.  Avoid places that make you want (trigger) to smoke, such as: ? Bars. ? Parties. ? Smoke-break areas at work.  Spend time with people who do not smoke.  Lower the stress in your life. Stress can make you want to smoke. Try these things to help your stress: ? Getting regular exercise. ? Deep-breathing exercises. ? Yoga. ? Meditating. ? Doing a body scan. To do this, close your eyes, focus on one area of your body at a time from head to toe, and notice which parts of your body are tense. Try to relax the muscles in those areas.  Download or buy apps on your mobile phone or tablet that can help you stick to your quit plan. There are many free apps, such as QuitGuide from the CDC (Centers for Disease Control and Prevention). You can find more support from smokefree.gov and other websites. This information is not intended to replace advice given to you by your health care provider. Make sure you discuss any questions you have with your health care provider. Document Released: 01/30/2009 Document Revised: 12/02/2015   Document Reviewed: 08/20/2014 Elsevier Interactive Patient Education  2019 Elsevier Inc.     Stroke Prevention Some medical conditions and lifestyle choices can lead to a higher risk for a stroke. You can help to prevent a stroke by making nutrition, lifestyle, and other changes. What nutrition changes can be made?   Eat healthy foods. ? Choose foods that are high in fiber. These include:  Fresh fruits.  Fresh vegetables.  Whole grains. ? Eat at least 5 or more servings of fruits and  vegetables each day. Try to fill half of your plate at each meal with fruits and vegetables. ? Choose lean protein foods. These include:  Lowfat (lean) cuts of meat.  Chicken without skin.  Fish.  Tofu.  Beans.  Nuts. ? Eat low-fat dairy products. ? Avoid foods that:  Are high in salt (sodium).  Have saturated fat.  Have trans fat.  Have cholesterol.  Are processed.  Are premade.  Follow eating guidelines as told by your doctor. These may include: ? Reducing how many calories you eat and drink each day. ? Limiting how much salt you eat or drink each day to 1,500 milligrams (mg). ? Using only healthy fats for cooking. These include:  Olive oil.  Canola oil.  Sunflower oil. ? Counting how many carbohydrates you eat and drink each day. What lifestyle changes can be made?  Try to stay at a healthy weight. Talk to your doctor about what a good weight is for you.  Get at least 30 minutes of moderate physical activity at least 5 days a week. This can include: ? Fast walking. ? Biking. ? Swimming.  Do not use any products that have nicotine or tobacco. This includes cigarettes and e-cigarettes. If you need help quitting, ask your doctor. Avoid being around tobacco smoke in general.  Limit how much alcohol you drink to no more than 1 drink a day for nonpregnant women and 2 drinks a day for men. One drink equals 12 oz of beer, 5 oz of wine, or 1 oz of hard liquor.  Do not use drugs.  Avoid taking birth control pills. Talk to your doctor about the risks of taking birth control pills if: ? You are over 35 years old. ? You smoke. ? You get migraines. ? You have had a blood clot. What other changes can be made?  Manage your cholesterol. ? It is important to eat a healthy diet. ? If your cholesterol cannot be managed through your diet, you may also need to take medicines. Take medicines as told by your doctor.  Manage your diabetes. ? It is important to eat a  healthy diet and to exercise regularly. ? If your blood sugar cannot be managed through diet and exercise, you may need to take medicines. Take medicines as told by your doctor.  Control your high blood pressure (hypertension). ? Try to keep your blood pressure below 130/80. This can help lower your risk of stroke. ? It is important to eat a healthy diet and to exercise regularly. ? If your blood pressure cannot be managed through diet and exercise, you may need to take medicines. Take medicines as told by your doctor. ? Ask your doctor if you should check your blood pressure at home. ? Have your blood pressure checked every year. Do this even if your blood pressure is normal.  Talk to your doctor about getting checked for a sleep disorder. Signs of this can include: ? Snoring a lot. ? Feeling   very tired.  Take over-the-counter and prescription medicines only as told by your doctor. These may include aspirin or blood thinners (antiplatelets or anticoagulants).  Make sure that any other medical conditions you have are managed. Where to find more information  American Stroke Association: www.strokeassociation.org  National Stroke Association: www.stroke.org Get help right away if:  You have any symptoms of stroke. "BE FAST" is an easy way to remember the main warning signs: ? B - Balance. Signs are dizziness, sudden trouble walking, or loss of balance. ? E - Eyes. Signs are trouble seeing or a sudden change in how you see. ? F - Face. Signs are sudden weakness or loss of feeling of the face, or the face or eyelid drooping on one side. ? A - Arms. Signs are weakness or loss of feeling in an arm. This happens suddenly and usually on one side of the body. ? S - Speech. Signs are sudden trouble speaking, slurred speech, or trouble understanding what people say. ? T - Time. Time to call emergency services. Write down what time symptoms started.  You have other signs of stroke, such as: ? A  sudden, very bad headache with no known cause. ? Feeling sick to your stomach (nausea). ? Throwing up (vomiting). ? Jerky movements you cannot control (seizure). These symptoms may represent a serious problem that is an emergency. Do not wait to see if the symptoms will go away. Get medical help right away. Call your local emergency services (911 in the [place] Do not drive yourself to the hospital. Summary  You can prevent a stroke by eating healthy, exercising, not smoking, drinking less alcohol, and treating other health problems, such as diabetes, high blood pressure, or high cholesterol.  Do not use any products that contain nicotine or tobacco, such as cigarettes and e-cigarettes.  Get help right away if you have any signs or symptoms of a stroke. This information is not intended to replace advice given to you by your health care provider. Make sure you discuss any questions you have with your health care provider. Document Released: 10/05/2011 Document Revised: 07/07/2016 Document Reviewed: 07/07/2016 Elsevier Interactive Patient Education  2019 Elsevier Inc.  

## 2018-04-22 DIAGNOSIS — J441 Chronic obstructive pulmonary disease with (acute) exacerbation: Secondary | ICD-10-CM | POA: Diagnosis not present

## 2018-04-22 DIAGNOSIS — J449 Chronic obstructive pulmonary disease, unspecified: Secondary | ICD-10-CM | POA: Diagnosis not present

## 2018-04-22 DIAGNOSIS — Z96642 Presence of left artificial hip joint: Secondary | ICD-10-CM | POA: Diagnosis not present

## 2018-04-25 DIAGNOSIS — Z6821 Body mass index (BMI) 21.0-21.9, adult: Secondary | ICD-10-CM | POA: Diagnosis not present

## 2018-04-25 DIAGNOSIS — J449 Chronic obstructive pulmonary disease, unspecified: Secondary | ICD-10-CM | POA: Diagnosis not present

## 2018-04-25 DIAGNOSIS — E782 Mixed hyperlipidemia: Secondary | ICD-10-CM | POA: Diagnosis not present

## 2018-04-25 DIAGNOSIS — I679 Cerebrovascular disease, unspecified: Secondary | ICD-10-CM | POA: Diagnosis not present

## 2018-04-25 DIAGNOSIS — I2510 Atherosclerotic heart disease of native coronary artery without angina pectoris: Secondary | ICD-10-CM | POA: Diagnosis not present

## 2018-04-25 DIAGNOSIS — K210 Gastro-esophageal reflux disease with esophagitis: Secondary | ICD-10-CM | POA: Diagnosis not present

## 2018-04-28 ENCOUNTER — Other Ambulatory Visit (INDEPENDENT_AMBULATORY_CARE_PROVIDER_SITE_OTHER): Payer: Self-pay | Admitting: Internal Medicine

## 2018-05-09 ENCOUNTER — Encounter (HOSPITAL_COMMUNITY): Payer: Medicare Other

## 2018-05-09 ENCOUNTER — Ambulatory Visit: Payer: Medicare Other | Admitting: Family

## 2018-05-16 ENCOUNTER — Ambulatory Visit: Payer: Medicare Other | Admitting: Cardiology

## 2018-05-17 DIAGNOSIS — R05 Cough: Secondary | ICD-10-CM | POA: Diagnosis not present

## 2018-05-17 DIAGNOSIS — J441 Chronic obstructive pulmonary disease with (acute) exacerbation: Secondary | ICD-10-CM | POA: Diagnosis not present

## 2018-05-17 DIAGNOSIS — Z6821 Body mass index (BMI) 21.0-21.9, adult: Secondary | ICD-10-CM | POA: Diagnosis not present

## 2018-05-23 DIAGNOSIS — Z96642 Presence of left artificial hip joint: Secondary | ICD-10-CM | POA: Diagnosis not present

## 2018-05-23 DIAGNOSIS — J449 Chronic obstructive pulmonary disease, unspecified: Secondary | ICD-10-CM | POA: Diagnosis not present

## 2018-05-23 DIAGNOSIS — J441 Chronic obstructive pulmonary disease with (acute) exacerbation: Secondary | ICD-10-CM | POA: Diagnosis not present

## 2018-06-21 DIAGNOSIS — J441 Chronic obstructive pulmonary disease with (acute) exacerbation: Secondary | ICD-10-CM | POA: Diagnosis not present

## 2018-06-21 DIAGNOSIS — J449 Chronic obstructive pulmonary disease, unspecified: Secondary | ICD-10-CM | POA: Diagnosis not present

## 2018-06-21 DIAGNOSIS — Z96642 Presence of left artificial hip joint: Secondary | ICD-10-CM | POA: Diagnosis not present

## 2018-07-04 ENCOUNTER — Other Ambulatory Visit: Payer: Self-pay | Admitting: *Deleted

## 2018-07-04 MED ORDER — NITROGLYCERIN 0.4 MG SL SUBL: SUBLINGUAL_TABLET | SUBLINGUAL | 3 refills | Status: DC

## 2018-07-22 DIAGNOSIS — J449 Chronic obstructive pulmonary disease, unspecified: Secondary | ICD-10-CM | POA: Diagnosis not present

## 2018-07-22 DIAGNOSIS — Z96642 Presence of left artificial hip joint: Secondary | ICD-10-CM | POA: Diagnosis not present

## 2018-07-22 DIAGNOSIS — J441 Chronic obstructive pulmonary disease with (acute) exacerbation: Secondary | ICD-10-CM | POA: Diagnosis not present

## 2018-08-15 ENCOUNTER — Ambulatory Visit: Payer: Medicare Other | Admitting: Cardiology

## 2018-08-21 DIAGNOSIS — Z96642 Presence of left artificial hip joint: Secondary | ICD-10-CM | POA: Diagnosis not present

## 2018-08-21 DIAGNOSIS — J449 Chronic obstructive pulmonary disease, unspecified: Secondary | ICD-10-CM | POA: Diagnosis not present

## 2018-08-21 DIAGNOSIS — J441 Chronic obstructive pulmonary disease with (acute) exacerbation: Secondary | ICD-10-CM | POA: Diagnosis not present

## 2018-08-22 DIAGNOSIS — J441 Chronic obstructive pulmonary disease with (acute) exacerbation: Secondary | ICD-10-CM | POA: Diagnosis not present

## 2018-09-08 ENCOUNTER — Encounter: Payer: Self-pay | Admitting: Cardiology

## 2018-09-08 DIAGNOSIS — J449 Chronic obstructive pulmonary disease, unspecified: Secondary | ICD-10-CM | POA: Diagnosis not present

## 2018-09-08 DIAGNOSIS — E7800 Pure hypercholesterolemia, unspecified: Secondary | ICD-10-CM | POA: Diagnosis not present

## 2018-09-08 DIAGNOSIS — R112 Nausea with vomiting, unspecified: Secondary | ICD-10-CM | POA: Diagnosis not present

## 2018-09-08 DIAGNOSIS — R531 Weakness: Secondary | ICD-10-CM | POA: Diagnosis not present

## 2018-09-08 DIAGNOSIS — R110 Nausea: Secondary | ICD-10-CM | POA: Diagnosis not present

## 2018-09-08 DIAGNOSIS — I252 Old myocardial infarction: Secondary | ICD-10-CM | POA: Diagnosis not present

## 2018-09-08 DIAGNOSIS — F17200 Nicotine dependence, unspecified, uncomplicated: Secondary | ICD-10-CM | POA: Diagnosis not present

## 2018-09-08 DIAGNOSIS — Z79899 Other long term (current) drug therapy: Secondary | ICD-10-CM | POA: Diagnosis not present

## 2018-09-08 DIAGNOSIS — Z7982 Long term (current) use of aspirin: Secondary | ICD-10-CM | POA: Diagnosis not present

## 2018-09-08 DIAGNOSIS — K219 Gastro-esophageal reflux disease without esophagitis: Secondary | ICD-10-CM | POA: Diagnosis not present

## 2018-09-19 ENCOUNTER — Ambulatory Visit: Payer: Medicare Other | Admitting: Family

## 2018-09-19 ENCOUNTER — Encounter (HOSPITAL_COMMUNITY): Payer: Medicare Other

## 2018-09-21 DIAGNOSIS — J441 Chronic obstructive pulmonary disease with (acute) exacerbation: Secondary | ICD-10-CM | POA: Diagnosis not present

## 2018-09-21 DIAGNOSIS — J449 Chronic obstructive pulmonary disease, unspecified: Secondary | ICD-10-CM | POA: Diagnosis not present

## 2018-09-21 DIAGNOSIS — Z96642 Presence of left artificial hip joint: Secondary | ICD-10-CM | POA: Diagnosis not present

## 2018-10-17 ENCOUNTER — Other Ambulatory Visit: Payer: Medicare HMO

## 2018-10-17 ENCOUNTER — Ambulatory Visit (INDEPENDENT_AMBULATORY_CARE_PROVIDER_SITE_OTHER): Payer: Medicare HMO | Admitting: Cardiology

## 2018-10-17 ENCOUNTER — Other Ambulatory Visit: Payer: Self-pay

## 2018-10-17 ENCOUNTER — Encounter: Payer: Self-pay | Admitting: *Deleted

## 2018-10-17 ENCOUNTER — Encounter: Payer: Self-pay | Admitting: Cardiology

## 2018-10-17 VITALS — BP 128/73 | HR 100 | Ht 75.0 in | Wt 181.2 lb

## 2018-10-17 DIAGNOSIS — I2510 Atherosclerotic heart disease of native coronary artery without angina pectoris: Secondary | ICD-10-CM

## 2018-10-17 DIAGNOSIS — E782 Mixed hyperlipidemia: Secondary | ICD-10-CM | POA: Diagnosis not present

## 2018-10-17 DIAGNOSIS — Z20822 Contact with and (suspected) exposure to covid-19: Secondary | ICD-10-CM

## 2018-10-17 DIAGNOSIS — I6523 Occlusion and stenosis of bilateral carotid arteries: Secondary | ICD-10-CM

## 2018-10-17 DIAGNOSIS — R6889 Other general symptoms and signs: Secondary | ICD-10-CM | POA: Diagnosis not present

## 2018-10-17 NOTE — Progress Notes (Signed)
452  

## 2018-10-17 NOTE — Patient Instructions (Signed)

## 2018-10-17 NOTE — Progress Notes (Signed)
Clinical Summary [name] is a 78 y.o.male seen today for follow up of the following medical problems.  1. CAD: prior stents in 2000 and 2001. Last cath 2008 w/ nonobstructive disease. Normal LVEF 65% by echo 2012. - nuclear stress 03/2015 which showed no specific ischemia.  09/2017 [place] nuclear stress inferoseptal scar, small apical infarct with mild peri-infarct ischemia. Low risk study.  - some recent chest pain - episode Sunday watching tv. Sharp pain across chest, 8-9/10. No other associated symptoms. Not positional. Pain lasted about 2 minutes, took NG. 2 episodes over the 6 weeks. - can have some SOB/DOE which is stable, better with inhaler.  - looks like imdur stopped after ER visit Jan 2019 with GI bleed .   No recent chest pain. Chronic SOB/DOE stable, he related to his COPD   2. Carotid stenosis  - prior bilateral carotid endarterectomies, recent US shows good patency. Followedby vascular   03/2018 carotid US mild blcokages bilaterally, patent CEA - no recent neuro symptoms  3. HL 09/2017 TC 136 TG 87 HDL 52 LDL 68 - he remains compliant with statin   4. 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 - he is compliant with inhalers Past Medical History:  Diagnosis Date  . CAD (coronary artery disease)    Normal LV function .Status post stent placement to the right coronary artery in 2000 and 2001Obstructive coronary artery disease in November 2008 by catheterization   . Carotid artery disease ([place])   . COPD (chronic obstructive pulmonary disease) ([place])   . GERD (gastroesophageal reflux disease)   . Hyperlipidemia   . Hypertension    pt denies, no meds     Allergies  Allergen Reactions  . Clarithromycin Nausea Only  . Other Itching, Rash and Other (See Comments)    Some medication OTC he took for headache caused a rash     Current Outpatient Medications  Medication Sig Dispense Refill  . aspirin 81 MG tablet Take 1 tablet (81 mg  total) by mouth daily. 30 tablet   . atorvastatin (LIPITOR) 80 MG tablet TAKE ONE TABLET BY MOUTH DAILY 15 tablet 0  . nitroGLYCERIN (NITROSTAT) 0.4 MG SL tablet DISSOLVE 1 TABLET BY MOUTH UNDER TONGUE EVERY 5 MINUTES AS NEEDED FOR CHEST PAIN. DO NOT EXCEED A TOTAL OF 3 DOSES IN 15 MIN 25 tablet 3  . pantoprazole (PROTONIX) 40 MG tablet TAKE 1 TABLET BY MOUTH DAILY BEFORE BREAKFAST 30 tablet 5  . PROAIR HFA 108 (90 Base) MCG/ACT inhaler Inhale 2 puffs into the lungs every 6 (six) hours as needed for wheezing or shortness of breath.      No current facility-administered medications for this visit.      Past Surgical History:  Procedure Laterality Date  . CARDIAC CATHETERIZATION  02/2007  . cardiac stents     2 stents placed   . CAROTID ENDARTERECTOMY Bilateral 2010      ([facility])  CE  . COLONOSCOPY N/A 05/23/2017   Procedure: COLONOSCOPY;  Surgeon: Rogene Houston, MD;  [facility];  Service: Endoscopy;  Laterality: N/A;  . ESOPHAGOGASTRODUODENOSCOPY N/A 05/23/2017   Procedure: ESOPHAGOGASTRODUODENOSCOPY (EGD);  Surgeon: Rogene Houston, MD;  [facility];  Service: Endoscopy;  Laterality: N/A;  . FRACTURE SURGERY     right arm  . HYDROCELE EXCISION Right 04/24/2013   Procedure: HYDROCELECTOMY ADULT;  Surgeon: Hanley Ben, MD;  Location: [facility];  Service: Urology;  Laterality: Right;  .  JOINT REPLACEMENT Left Nov. 3, 2015   Hip  . TOTAL HIP ARTHROPLASTY Left 02/19/2014   Procedure: TOTAL HIP ARTHROPLASTY;  Surgeon: Garald Balding, MD;  Location: [place];  Service: Orthopedics;  Laterality: Left;     Allergies  Allergen Reactions  . Clarithromycin Nausea Only  . Other Itching, Rash and Other (See Comments)    Some medication OTC he took for headache caused a rash      Family History  Problem Relation Age of Onset  . Heart disease Mother        Before age 43  . Heart disease Father   . Heart attack Father   . Colon cancer Neg  Hx      Social History [name] reports that he has been smoking cigarettes. He started smoking about 65 years ago. He has a 55.00 pack-year smoking history. His smokeless tobacco use includes chew. [name] reports no history of alcohol use.   Review of Systems CONSTITUTIONAL: No weight loss, fever, chills, weakness or fatigue.  HEENT: Eyes: No visual loss, blurred vision, double vision or yellow sclerae.No hearing loss, sneezing, congestion, runny nose or sore throat.  SKIN: No rash or itching.  CARDIOVASCULAR: per hpi RESPIRATORY: No shortness of breath, cough or sputum.  GASTROINTESTINAL: No anorexia, nausea, vomiting or diarrhea. No abdominal pain or blood.  GENITOURINARY: No burning on urination, no polyuria NEUROLOGICAL: No headache, dizziness, syncope, paralysis, ataxia, numbness or tingling in the extremities. No change in bowel or bladder control.  MUSCULOSKELETAL: No muscle, back pain, joint pain or stiffness.  LYMPHATICS: No enlarged nodes. No history of splenectomy.  PSYCHIATRIC: No history of depression or anxiety.  ENDOCRINOLOGIC: No reports of sweating, cold or heat intolerance. No polyuria or polydipsia.  Marland Kitchen   Physical Examination Today's Vitals   10/17/18 1434  BP: 128/73  Pulse: 100  SpO2: 95%  Weight: 181 lb 3.2 oz (82.2 kg)  Height: 6\' 3"  (1.905 m)   Body mass index is 22.65 kg/m.  Gen: resting comfortably, no acute distress HEENT: no scleral icterus, pupils equal round and reactive, no palptable cervical adenopathy,  CV: RRR, no m/r/g, no jvd Resp:+bilateral wheezing.  GI: abdomen is soft, non-tender, non-distended, normal bowel sounds, no hepatosplenomegaly MSK: extremities are warm, no edema.  Skin: warm, no rash Neuro:  no focal deficits Psych: appropriate affect   Diagnostic Studies  12/12 DSE: Baseline LVEF 55-60%. Negative for ischemia.   07/2012 Carotids: patent bilaterally w/ minimal plaque/hyperplasia, vertebals normal bilateral    01/02/13 EKG: SR, normal axis, no ischemic changes  03/2015 Dobut[place] nuclear stress  Defect 1: There is a small defect of moderate severity present in the basal inferoseptal, mid inferoseptal, apical septal and apical inferior location. This is most likely due to soft tissue attenuation artifact. However, myocardial scar cannot entirely be ruled out.  This is a low risk study. No ischemic territories.  Nuclear stress EF: 58%.   09/2017 Dobut[place] nuclear stress  There was no ST segment deviation noted during stress.  Findings consistent with prior inferoseptal infarct without current ischemia. Small apical infarct with mild peri-infarct ischemia.  This is a low risk study.  The left ventricular ejection fraction is normal (55-65%).   Assessment and Plan   1. CAD -recent stress test low risk, overall mild apical ischemia.  - no recent symptoms - had been on imdur though somehow stopped taking. If recurrent chest pain would restart   2. Carotid stenosis - no symptoms, recent US without significant  disease and patent CEAs - continue to monitor  3. Hyperlipidemia -continue statin, labs per pcp   F/u 6 months     Arnoldo Lenis, M.D.

## 2018-10-18 DIAGNOSIS — E782 Mixed hyperlipidemia: Secondary | ICD-10-CM | POA: Diagnosis not present

## 2018-10-18 DIAGNOSIS — K210 Gastro-esophageal reflux disease with esophagitis: Secondary | ICD-10-CM | POA: Diagnosis not present

## 2018-10-18 DIAGNOSIS — J449 Chronic obstructive pulmonary disease, unspecified: Secondary | ICD-10-CM | POA: Diagnosis not present

## 2018-10-19 DIAGNOSIS — K210 Gastro-esophageal reflux disease with esophagitis: Secondary | ICD-10-CM | POA: Diagnosis not present

## 2018-10-19 DIAGNOSIS — I2510 Atherosclerotic heart disease of native coronary artery without angina pectoris: Secondary | ICD-10-CM | POA: Diagnosis not present

## 2018-10-19 DIAGNOSIS — R7301 Impaired fasting glucose: Secondary | ICD-10-CM | POA: Diagnosis not present

## 2018-10-19 DIAGNOSIS — Z6821 Body mass index (BMI) 21.0-21.9, adult: Secondary | ICD-10-CM | POA: Diagnosis not present

## 2018-10-19 DIAGNOSIS — J449 Chronic obstructive pulmonary disease, unspecified: Secondary | ICD-10-CM | POA: Diagnosis not present

## 2018-10-19 DIAGNOSIS — E782 Mixed hyperlipidemia: Secondary | ICD-10-CM | POA: Diagnosis not present

## 2018-10-19 DIAGNOSIS — I679 Cerebrovascular disease, unspecified: Secondary | ICD-10-CM | POA: Diagnosis not present

## 2018-10-21 DIAGNOSIS — J441 Chronic obstructive pulmonary disease with (acute) exacerbation: Secondary | ICD-10-CM | POA: Diagnosis not present

## 2018-10-21 DIAGNOSIS — Z96642 Presence of left artificial hip joint: Secondary | ICD-10-CM | POA: Diagnosis not present

## 2018-10-21 DIAGNOSIS — J449 Chronic obstructive pulmonary disease, unspecified: Secondary | ICD-10-CM | POA: Diagnosis not present

## 2018-10-24 LAB — NOVEL CORONAVIRUS, NAA: SARS-CoV-2, NAA: NOT DETECTED

## 2018-11-17 DIAGNOSIS — E782 Mixed hyperlipidemia: Secondary | ICD-10-CM | POA: Diagnosis not present

## 2018-11-17 DIAGNOSIS — I6789 Other cerebrovascular disease: Secondary | ICD-10-CM | POA: Diagnosis not present

## 2018-11-21 DIAGNOSIS — J449 Chronic obstructive pulmonary disease, unspecified: Secondary | ICD-10-CM | POA: Diagnosis not present

## 2018-11-21 DIAGNOSIS — Z96642 Presence of left artificial hip joint: Secondary | ICD-10-CM | POA: Diagnosis not present

## 2018-11-21 DIAGNOSIS — J441 Chronic obstructive pulmonary disease with (acute) exacerbation: Secondary | ICD-10-CM | POA: Diagnosis not present

## 2018-12-18 DIAGNOSIS — I25110 Atherosclerotic heart disease of native coronary artery with unstable angina pectoris: Secondary | ICD-10-CM | POA: Diagnosis not present

## 2018-12-18 DIAGNOSIS — E782 Mixed hyperlipidemia: Secondary | ICD-10-CM | POA: Diagnosis not present

## 2018-12-22 DIAGNOSIS — J441 Chronic obstructive pulmonary disease with (acute) exacerbation: Secondary | ICD-10-CM | POA: Diagnosis not present

## 2018-12-22 DIAGNOSIS — J449 Chronic obstructive pulmonary disease, unspecified: Secondary | ICD-10-CM | POA: Diagnosis not present

## 2018-12-22 DIAGNOSIS — Z96642 Presence of left artificial hip joint: Secondary | ICD-10-CM | POA: Diagnosis not present

## 2019-01-05 DIAGNOSIS — Z23 Encounter for immunization: Secondary | ICD-10-CM | POA: Diagnosis not present

## 2019-01-21 DIAGNOSIS — Z96642 Presence of left artificial hip joint: Secondary | ICD-10-CM | POA: Diagnosis not present

## 2019-01-21 DIAGNOSIS — J441 Chronic obstructive pulmonary disease with (acute) exacerbation: Secondary | ICD-10-CM | POA: Diagnosis not present

## 2019-01-21 DIAGNOSIS — J449 Chronic obstructive pulmonary disease, unspecified: Secondary | ICD-10-CM | POA: Diagnosis not present

## 2019-02-05 DIAGNOSIS — M25511 Pain in right shoulder: Secondary | ICD-10-CM | POA: Diagnosis not present

## 2019-02-05 DIAGNOSIS — Z6820 Body mass index (BMI) 20.0-20.9, adult: Secondary | ICD-10-CM | POA: Diagnosis not present

## 2019-02-13 ENCOUNTER — Ambulatory Visit: Payer: Medicare HMO | Admitting: Orthopaedic Surgery

## 2019-02-14 DIAGNOSIS — R7301 Impaired fasting glucose: Secondary | ICD-10-CM | POA: Diagnosis not present

## 2019-02-14 DIAGNOSIS — E782 Mixed hyperlipidemia: Secondary | ICD-10-CM | POA: Diagnosis not present

## 2019-02-16 DIAGNOSIS — E782 Mixed hyperlipidemia: Secondary | ICD-10-CM | POA: Diagnosis not present

## 2019-02-16 DIAGNOSIS — R7301 Impaired fasting glucose: Secondary | ICD-10-CM | POA: Diagnosis not present

## 2019-02-19 DIAGNOSIS — I2510 Atherosclerotic heart disease of native coronary artery without angina pectoris: Secondary | ICD-10-CM | POA: Diagnosis not present

## 2019-02-19 DIAGNOSIS — E782 Mixed hyperlipidemia: Secondary | ICD-10-CM | POA: Diagnosis not present

## 2019-02-19 DIAGNOSIS — I679 Cerebrovascular disease, unspecified: Secondary | ICD-10-CM | POA: Diagnosis not present

## 2019-02-19 DIAGNOSIS — J449 Chronic obstructive pulmonary disease, unspecified: Secondary | ICD-10-CM | POA: Diagnosis not present

## 2019-02-19 DIAGNOSIS — K2100 Gastro-esophageal reflux disease with esophagitis, without bleeding: Secondary | ICD-10-CM | POA: Diagnosis not present

## 2019-02-19 DIAGNOSIS — R7301 Impaired fasting glucose: Secondary | ICD-10-CM | POA: Diagnosis not present

## 2019-02-21 DIAGNOSIS — Z96642 Presence of left artificial hip joint: Secondary | ICD-10-CM | POA: Diagnosis not present

## 2019-02-21 DIAGNOSIS — J441 Chronic obstructive pulmonary disease with (acute) exacerbation: Secondary | ICD-10-CM | POA: Diagnosis not present

## 2019-02-21 DIAGNOSIS — J449 Chronic obstructive pulmonary disease, unspecified: Secondary | ICD-10-CM | POA: Diagnosis not present

## 2019-03-07 ENCOUNTER — Ambulatory Visit: Payer: Medicare HMO | Admitting: Orthopaedic Surgery

## 2019-03-07 ENCOUNTER — Other Ambulatory Visit: Payer: Self-pay

## 2019-03-20 DIAGNOSIS — R000 Tachycardia, unspecified: Secondary | ICD-10-CM | POA: Diagnosis not present

## 2019-03-20 DIAGNOSIS — Z20828 Contact with and (suspected) exposure to other viral communicable diseases: Secondary | ICD-10-CM | POA: Diagnosis not present

## 2019-03-20 DIAGNOSIS — Z1159 Encounter for screening for other viral diseases: Secondary | ICD-10-CM | POA: Diagnosis not present

## 2019-03-20 DIAGNOSIS — Z7982 Long term (current) use of aspirin: Secondary | ICD-10-CM | POA: Diagnosis not present

## 2019-03-20 DIAGNOSIS — M549 Dorsalgia, unspecified: Secondary | ICD-10-CM | POA: Diagnosis not present

## 2019-03-20 DIAGNOSIS — R531 Weakness: Secondary | ICD-10-CM | POA: Diagnosis not present

## 2019-03-20 DIAGNOSIS — Z79899 Other long term (current) drug therapy: Secondary | ICD-10-CM | POA: Diagnosis not present

## 2019-03-20 DIAGNOSIS — R05 Cough: Secondary | ICD-10-CM | POA: Diagnosis not present

## 2019-03-20 DIAGNOSIS — R0602 Shortness of breath: Secondary | ICD-10-CM | POA: Diagnosis not present

## 2019-03-20 DIAGNOSIS — Z720 Tobacco use: Secondary | ICD-10-CM | POA: Diagnosis not present

## 2019-03-20 DIAGNOSIS — R110 Nausea: Secondary | ICD-10-CM | POA: Diagnosis not present

## 2019-03-20 DIAGNOSIS — R5383 Other fatigue: Secondary | ICD-10-CM | POA: Diagnosis not present

## 2019-03-20 DIAGNOSIS — J449 Chronic obstructive pulmonary disease, unspecified: Secondary | ICD-10-CM | POA: Diagnosis not present

## 2019-03-23 DIAGNOSIS — Z96642 Presence of left artificial hip joint: Secondary | ICD-10-CM | POA: Diagnosis not present

## 2019-03-23 DIAGNOSIS — J449 Chronic obstructive pulmonary disease, unspecified: Secondary | ICD-10-CM | POA: Diagnosis not present

## 2019-03-23 DIAGNOSIS — J441 Chronic obstructive pulmonary disease with (acute) exacerbation: Secondary | ICD-10-CM | POA: Diagnosis not present

## 2019-03-26 DIAGNOSIS — J441 Chronic obstructive pulmonary disease with (acute) exacerbation: Secondary | ICD-10-CM | POA: Diagnosis not present

## 2019-03-26 DIAGNOSIS — R05 Cough: Secondary | ICD-10-CM | POA: Diagnosis not present

## 2019-03-26 DIAGNOSIS — R0602 Shortness of breath: Secondary | ICD-10-CM | POA: Diagnosis not present

## 2019-04-19 DIAGNOSIS — K2100 Gastro-esophageal reflux disease with esophagitis, without bleeding: Secondary | ICD-10-CM | POA: Diagnosis not present

## 2019-04-19 DIAGNOSIS — J441 Chronic obstructive pulmonary disease with (acute) exacerbation: Secondary | ICD-10-CM | POA: Diagnosis not present

## 2019-05-02 ENCOUNTER — Encounter: Payer: Self-pay | Admitting: Orthopaedic Surgery

## 2019-05-02 ENCOUNTER — Ambulatory Visit (INDEPENDENT_AMBULATORY_CARE_PROVIDER_SITE_OTHER): Payer: Medicare HMO | Admitting: Orthopaedic Surgery

## 2019-05-02 DIAGNOSIS — M25511 Pain in right shoulder: Secondary | ICD-10-CM | POA: Insufficient documentation

## 2019-05-02 DIAGNOSIS — G8929 Other chronic pain: Secondary | ICD-10-CM

## 2019-05-02 MED ORDER — METHYLPREDNISOLONE ACETATE 40 MG/ML IJ SUSP
80.0000 mg | INTRAMUSCULAR | Status: AC | PRN
  Administered 2019-05-02: 80 mg via INTRA_ARTICULAR

## 2019-05-02 MED ORDER — BUPIVACAINE HCL 0.5 % IJ SOLN
2.0000 mL | INTRAMUSCULAR | Status: AC | PRN
  Administered 2019-05-02: 11:00:00 2 mL via INTRA_ARTICULAR

## 2019-05-02 MED ORDER — LIDOCAINE HCL 1 % IJ SOLN
2.0000 mL | INTRAMUSCULAR | Status: AC | PRN
  Administered 2019-05-02: 11:00:00 2 mL

## 2019-05-02 NOTE — Progress Notes (Signed)
Office Visit Note   Patient: Joseph Forbes           Date of Birth: March 31, 1941           MRN: [id number] Visit Date: 05/02/2019              Requested by: Sasser, Silvestre Moment, MD [place],  [place] [address] PCP: Manon Hilding, MD   Assessment & Plan: Visit Diagnoses:  1. Chronic right shoulder pain     Plan: Recurrent right shoulder pain and impingement.  Last cortisone injection over a year ago with good relief only to have some recurrence of anterior pain.  Had films performed through his primary care physician's office which I have reviewed.  He has a very small inferior humeral head spur probably consistent with early glenohumeral arthritis.  He certainly could have a rotator cuff tear.  Long discussion with Mr. Dowis regarding his pain.Marland Kitchen  He would prefer not to pursue any further diagnostic testing but would like to cortisone injection.  Follow-Up Instructions: Return if symptoms worsen or fail to improve.   Orders:  Orders Placed This Encounter  Procedures  . Large Joint Inj: R glenohumeral   No orders of the defined types were placed in this encounter.     Procedures: Large Joint Inj: R glenohumeral on 05/02/2019 10:57 AM Indications: pain and diagnostic evaluation Details: 25 G 1.5 in needle, anteromedial approach  Arthrogram: No  Medications: 2 mL lidocaine 1 %; 2 mL bupivacaine 0.5 %; 80 mg methylPREDNISolone acetate 40 MG/ML Consent was given by the patient. Immediately prior to procedure a time out was called to verify the correct patient, procedure, equipment, support staff and site/side marked as required. Patient was prepped and draped in the usual sterile fashion.       Clinical Data: No additional findings.   Subjective: Chief Complaint  Patient presents with  . Right Shoulder - Pain  Patient presents today for recurrent right shoulder pain. He said that it has been hurting again for 6 months. No known injury. He was last evaluated in September  of 2019 for the same issue and had a cortisone injection. His pain is located in his superior shoulder and proximal arm. It hurts with range of motion. No weakness, numbness, or tingling. He does not take anything for pain. He is right hand dominant. He saw his PCP three months ago and had x-rays. He brought a disc with him to today's appointment with those images.  He is not interested in further diagnostic testing to evaluate the cause of the shoulder pain HPI  Review of Systems   Objective: Vital Signs: Ht 6\' 3"  (1.905 m)   Wt 180 lb (81.6 kg)   BMI 22.50 kg/m   Physical Exam Constitutional:      Appearance: He is well-developed.  Eyes:     Pupils: Pupils are equal, round, and reactive to light.  Pulmonary:     Effort: Pulmonary effort is normal.  Skin:    General: Skin is warm and dry.  Neurological:     Mental Status: He is alert and oriented to person, place, and time.  Psychiatric:        Behavior: Behavior normal.     Ortho Exam awake alert and oriented x3.  Comfortable sitting.  Obvious grating with internal and external rotation of his right shoulder seems to be referred from the glenohumeral joint.  There is some popping.  He was able to place  his arm over his head with a slightly circuitous motion.  No localized tenderness over the [facility] joint or the anterior or lateral subacromial region.  Thin arms.  Biceps intact.    Specialty Comments:  No specialty comments available.  Imaging: No results found.   PMFS History: Patient Active Problem List   Diagnosis Date Noted  . Pain in right shoulder 05/02/2019  . Melena 05/18/2017  . Abnormal CT of the abdomen 05/18/2017  . Low back pain with sciatica 11/03/2016  . Compression fracture   . Obstructive chronic bronchitis without exacerbation ([place]) 02/21/2014  . Osteoarthritis of left hip 02/21/2014  . S/P total hip arthroplasty 02/19/2014  . Aftercare following surgery of the circulatory system, [place] 08/16/2013  .  Occlusion and stenosis of carotid artery without mention of cerebral infarction 07/30/2011  . DYSLIPIDEMIA 08/25/2009  . TOBACCO ABUSE 08/25/2009  . CAD (coronary artery disease), native coronary artery 08/25/2009  . PVD 08/25/2009  . CAROTID ENDARTERECTOMY, BILATERAL, HX OF 08/25/2009   Past Medical History:  Diagnosis Date  . CAD (coronary artery disease)    Normal LV function .Status post stent placement to the right coronary artery in 2000 and 2001Obstructive coronary artery disease in November 2008 by catheterization   . Carotid artery disease ([place])   . COPD (chronic obstructive pulmonary disease) ([place])   . GERD (gastroesophageal reflux disease)   . Hyperlipidemia   . Hypertension    pt denies, no meds    Family History  Problem Relation Age of Onset  . Heart disease Mother        Before age 46  . Heart disease Father   . Heart attack Father   . Colon cancer Neg Hx     Past Surgical History:  Procedure Laterality Date  . CARDIAC CATHETERIZATION  02/2007  . cardiac stents     2 stents placed   . CAROTID ENDARTERECTOMY Bilateral 2010      ([facility])  CE  . COLONOSCOPY N/A 05/23/2017   Procedure: COLONOSCOPY;  Surgeon: Rogene Houston, MD;  [facility];  Service: Endoscopy;  Laterality: N/A;  . ESOPHAGOGASTRODUODENOSCOPY N/A 05/23/2017   Procedure: ESOPHAGOGASTRODUODENOSCOPY (EGD);  Surgeon: Rogene Houston, MD;  [facility];  Service: Endoscopy;  Laterality: N/A;  . FRACTURE SURGERY     right arm  . HYDROCELE EXCISION Right 04/24/2013   Procedure: HYDROCELECTOMY ADULT;  Surgeon: Hanley Ben, MD;  Location: [facility];  Service: Urology;  Laterality: Right;  . JOINT REPLACEMENT Left Nov. 3, 2015   Hip  . TOTAL HIP ARTHROPLASTY Left 02/19/2014   Procedure: TOTAL HIP ARTHROPLASTY;  Surgeon: Garald Balding, MD;  Location: [facility];  Service: Orthopedics;  Laterality: Left;   Social History   Occupational History  . Not on file   Tobacco Use  . Smoking status: Current Every Day Smoker    Packs/day: 1.00    Years: 55.00    Pack years: 55.00    Types: Cigarettes    Start date: 06/19/1953  . Smokeless tobacco: Current User    Types: Chew  Substance and Sexual Activity  . Alcohol use: No    Alcohol/week: 0.0 standard drinks  . Drug use: No  . Sexual activity: Not on file

## 2019-05-18 DIAGNOSIS — K219 Gastro-esophageal reflux disease without esophagitis: Secondary | ICD-10-CM | POA: Diagnosis not present

## 2019-05-18 DIAGNOSIS — J441 Chronic obstructive pulmonary disease with (acute) exacerbation: Secondary | ICD-10-CM | POA: Diagnosis not present

## 2019-06-01 ENCOUNTER — Other Ambulatory Visit: Payer: Self-pay | Admitting: Cardiology

## 2019-06-01 MED ORDER — NITROGLYCERIN 0.4 MG SL SUBL: SUBLINGUAL_TABLET | SUBLINGUAL | 0 refills | Status: DC

## 2019-06-01 NOTE — Telephone Encounter (Signed)
Medication sent to pharmacy  

## 2019-06-01 NOTE — Telephone Encounter (Signed)
°*  STAT* If patient is at the pharmacy, call can be transferred to refill team.   1. Which medications need to be refilled?  nitroGLYCERIN (NITROSTAT) 0.4 MG SL tablet   2. Which pharmacy/location (including street and city if local pharmacy) is medication to be sent to? [facility] in [place]  3. Do they need a 30 day or 90 day supply?

## 2019-06-11 DIAGNOSIS — B029 Zoster without complications: Secondary | ICD-10-CM | POA: Diagnosis not present

## 2019-06-15 DIAGNOSIS — I10 Essential (primary) hypertension: Secondary | ICD-10-CM | POA: Diagnosis not present

## 2019-06-15 DIAGNOSIS — E7849 Other hyperlipidemia: Secondary | ICD-10-CM | POA: Diagnosis not present

## 2019-06-18 DIAGNOSIS — J449 Chronic obstructive pulmonary disease, unspecified: Secondary | ICD-10-CM | POA: Diagnosis not present

## 2019-06-18 DIAGNOSIS — R7301 Impaired fasting glucose: Secondary | ICD-10-CM | POA: Diagnosis not present

## 2019-06-18 DIAGNOSIS — E782 Mixed hyperlipidemia: Secondary | ICD-10-CM | POA: Diagnosis not present

## 2019-06-18 DIAGNOSIS — I2510 Atherosclerotic heart disease of native coronary artery without angina pectoris: Secondary | ICD-10-CM | POA: Diagnosis not present

## 2019-06-18 DIAGNOSIS — K2100 Gastro-esophageal reflux disease with esophagitis, without bleeding: Secondary | ICD-10-CM | POA: Diagnosis not present

## 2019-06-21 DIAGNOSIS — Z1212 Encounter for screening for malignant neoplasm of rectum: Secondary | ICD-10-CM | POA: Diagnosis not present

## 2019-06-21 DIAGNOSIS — Z6820 Body mass index (BMI) 20.0-20.9, adult: Secondary | ICD-10-CM | POA: Diagnosis not present

## 2019-06-21 DIAGNOSIS — B029 Zoster without complications: Secondary | ICD-10-CM | POA: Diagnosis not present

## 2019-06-21 DIAGNOSIS — I2510 Atherosclerotic heart disease of native coronary artery without angina pectoris: Secondary | ICD-10-CM | POA: Diagnosis not present

## 2019-06-21 DIAGNOSIS — Z0001 Encounter for general adult medical examination with abnormal findings: Secondary | ICD-10-CM | POA: Diagnosis not present

## 2019-06-21 DIAGNOSIS — I679 Cerebrovascular disease, unspecified: Secondary | ICD-10-CM | POA: Diagnosis not present

## 2019-06-21 DIAGNOSIS — B0229 Other postherpetic nervous system involvement: Secondary | ICD-10-CM | POA: Diagnosis not present

## 2019-06-21 DIAGNOSIS — J449 Chronic obstructive pulmonary disease, unspecified: Secondary | ICD-10-CM | POA: Diagnosis not present

## 2019-07-02 DIAGNOSIS — R110 Nausea: Secondary | ICD-10-CM | POA: Diagnosis not present

## 2019-07-02 DIAGNOSIS — A084 Viral intestinal infection, unspecified: Secondary | ICD-10-CM | POA: Diagnosis not present

## 2019-07-18 DIAGNOSIS — E7849 Other hyperlipidemia: Secondary | ICD-10-CM | POA: Diagnosis not present

## 2019-07-18 DIAGNOSIS — I10 Essential (primary) hypertension: Secondary | ICD-10-CM | POA: Diagnosis not present

## 2019-07-30 ENCOUNTER — Ambulatory Visit: Payer: Medicare HMO | Admitting: Cardiology

## 2019-07-30 NOTE — Progress Notes (Addendum)
Cardiology Office Note  Date: 07/31/2019   ID: Joseph Forbes, DOB 1940/07/12, MRN [id number]  PCP:  Manon Hilding, MD  Cardiologist:  Carlyle Dolly, MD Electrophysiologist:  None   Chief Complaint:   History of Present Illness: Joseph Forbes is a 79 y.o. male with a history of CAD with prior stents in 2000 2001. Last cath 2008 with nonobstructive disease, normal EF 65% by echo 2012, nuclear stress 2016 no specific ischemia, dobutamine stress 2019 inferoseptal scar small apical infarct with mild peri-infarct ischemia, low risk study.  History of carotid artery stenosis prior bilateral CEA.  Recent carotid artery duplex showed good patency.  Followed by vascular.  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    Last encounter with Dr. Harl Bowie 10/17/2018.  At that visit patient had complained of some recent chest pain with no other associated symptoms.  He took nitroglycerin for 2 episodes over prior 6 weeks.  He complained of some SOB/DOE which he related to COPD, which was stable improved with inhalers.  Imdur was stopped at emergency room visit in 2019 after presenting with a GI bleed.  Carotid ultrasound 2019 showed mild stenosis bilaterally, patent CEA's bilaterally.  He was compliant with statin medication  EKG today shows ventricular bigeminy which is a new finding on EKG.  Previous EKGs did not show any ectopic rhythms.  Patient denies any anginal symptoms such as chest pain, pressure, or tightness.  He has chronic dyspnea secondary to severe COPD/emphysema from smoking.  Past Medical History:  Diagnosis Date  . CAD (coronary artery disease)    Normal LV function .Status post stent placement to the right coronary artery in 2000 and 2001Obstructive coronary artery disease in November 2008 by catheterization   . Carotid artery disease ([place])   . COPD (chronic obstructive pulmonary disease) ([place])   . GERD (gastroesophageal reflux disease)   . Hyperlipidemia   .  Hypertension    pt denies, no meds    Past Surgical History:  Procedure Laterality Date  . CARDIAC CATHETERIZATION  02/2007  . cardiac stents     2 stents placed   . CAROTID ENDARTERECTOMY Bilateral 2010      ([facility])  CE  . COLONOSCOPY N/A 05/23/2017   Procedure: COLONOSCOPY;  Surgeon: Rogene Houston, MD;  [facility];  Service: Endoscopy;  Laterality: N/A;  . ESOPHAGOGASTRODUODENOSCOPY N/A 05/23/2017   Procedure: ESOPHAGOGASTRODUODENOSCOPY (EGD);  Surgeon: Rogene Houston, MD;  [facility];  Service: Endoscopy;  Laterality: N/A;  . FRACTURE SURGERY     right arm  . HYDROCELE EXCISION Right 04/24/2013   Procedure: HYDROCELECTOMY ADULT;  Surgeon: Hanley Ben, MD;  Location: [facility];  Service: Urology;  Laterality: Right;  . JOINT REPLACEMENT Left Nov. 3, 2015   Hip  . TOTAL HIP ARTHROPLASTY Left 02/19/2014   Procedure: TOTAL HIP ARTHROPLASTY;  Surgeon: Garald Balding, MD;  Location: [facility];  Service: Orthopedics;  Laterality: Left;    Current Outpatient Medications  Medication Sig Dispense Refill  . aspirin 81 MG tablet Take 1 tablet (81 mg total) by mouth daily. 30 tablet   . atorvastatin (LIPITOR) 80 MG tablet TAKE ONE TABLET BY MOUTH DAILY 15 tablet 0  . ipratropium (ATROVENT) 0.02 % nebulizer solution Inhale 3 mLs into the lungs every 6 (six) hours as needed.    . nitroGLYCERIN (NITROSTAT) 0.4 MG SL tablet DISSOLVE 1 TABLET BY MOUTH UNDER TONGUE EVERY 5 MINUTES AS  NEEDED FOR CHEST PAIN. DO NOT EXCEED A TOTAL OF 3 DOSES IN 15 MIN 25 tablet 0  . pantoprazole (PROTONIX) 40 MG tablet TAKE 1 TABLET BY MOUTH DAILY BEFORE BREAKFAST 30 tablet 5  . PROAIR HFA 108 (90 Base) MCG/ACT inhaler Inhale 2 puffs into the lungs every 6 (six) hours as needed for wheezing or shortness of breath.     [place]uts INHUB 250-50 MCG/DOSE AEPB Inhale 1 puff into the lungs 2 (two) times a day.     No current facility-administered medications for this visit.     Allergies:  Clarithromycin and Other   Social History: The patient  reports that he has been smoking cigarettes. He started smoking about 66 years ago. He has a 55.00 pack-year smoking history. His smokeless tobacco use includes chew. He reports that he does not drink alcohol or use drugs.   Family History: The patient's family history includes Heart attack in his father; Heart disease in his father and mother.   ROS:  Please see the history of present illness. Otherwise, complete review of systems is positive for none.  All other systems are reviewed and negative.   Physical Exam: VS:  BP (!) 128/50   Pulse 94   Ht 6\' 3"  (1.905 m)   Wt 172 lb (78 kg)   SpO2 93%   BMI 21.50 kg/m , BMI Body mass index is 21.5 kg/m.  Wt Readings from Last 3 Encounters:  07/31/19 172 lb (78 kg)  05/02/19 180 lb (81.6 kg)  10/17/18 181 lb 3.2 oz (82.2 kg)    General: Patient appears comfortable at rest. Neck: Supple, no elevated JVP or carotid bruits, no thyromegaly. Lungs: Lung sounds decreased with prolonged expiratory phase and expiratory wheezing noted. nonlabored breathing at rest. Cardiac: Regular rate and rhythm, no S3 or significant systolic murmur, no pericardial rub. Extremities: No pitting edema, distal pulses 2+. Skin: Warm and dry. Musculoskeletal: No kyphosis. Neuropsychiatric: Alert and oriented x3, affect grossly appropriate.  ECG:  An ECG dated 07/31/2019 was personally reviewed today and demonstrated:  Sinus rhythm short PR syndrome, frequent PVCs, ventricular bigeminy, ST depression plus anterior lateral ischemia heart rate of 94  Recent Labwork: No results found for requested labs within last 8760 hours.  No results found for: CHOL, TRIG, HDL, CHOLHDL, VLDL, LDLCALC, LDLDIRECT  Other Studies Reviewed Today: 12/12 DSE: Baseline LVEF 55-60%. Negative for ischemia.   07/2012 Carotids: patent bilaterally w/ minimal plaque/hyperplasia, vertebals normal bilateral   01/02/13  EKG: SR, normal axis, no ischemic changes  03/2015 Dobutamine nuclear stress  Defect 1: There is a small defect of moderate severity present in the basal inferoseptal, mid inferoseptal, apical septal and apical inferior location. This is most likely due to soft tissue attenuation artifact. However, myocardial scar cannot entirely be ruled out.  This is a low risk study. No ischemic territories.  Nuclear stress EF: 58%.   09/2017 Dobutamine nuclear stress  There was no ST segment deviation noted during stress.  Findings consistent with prior inferoseptal infarct without current ischemia. Small apical infarct with mild peri-infarct ischemia.  This is a low risk study.  The left ventricular ejection fraction is normal (55-65%).   Assessment and Plan:  1. CAD in native artery   2. Mixed hyperlipidemia   3. Bilateral carotid artery stenosis   4. Nonspecific abnormal electrocardiogram (ECG) (EKG)    1. CAD in native artery Previous history of stenting in 2000 and 2001.  Follow-up dobutamine nuclear stress test showed a  small defect of moderate severity and basal inferior septal and mid inferior septal, apical inferior location.  However myocardial scar could not be entirely ruled out, no ischemic territories.  Patient is having multiple PVCs on monitor today.  This has not been present on previous EKGs.  He denies any progressive anginal symptoms.  He has chronic dyspnea due to significant COPD.  This could be an anginal equivalent.  Get repeat dobutamine stress test.  Continue aspirin 81 mg, nitroglycerin sublingual as needed.  2.  Ventricular bigeminy  EKG today shows ventricular bigeminy which is a new finding on EKG.  Previous EKGs did not show any ectopic rhythms.  Patient denies any anginal symptoms such as chest pain, pressure, or tightness.  He has chronic dyspnea secondary to severe COPD/emphysema from smoking.  Get BMP and magnesium.   3. Mixed hyperlipidemia Lipid profile  on 09/26/2017 showed triglycerides of 87, total cholesterol 136, HDL 52, LDL 68. Continue high intensity atorvastatin 80 mg daily.   4. Bilateral carotid artery stenosis History of bilateral carotid endarterectomies.  Patient has a left carotid bruit on auscultation today.   Medication Adjustments/Labs and Tests Ordered: Current medicines are reviewed at length with the patient today.  Concerns regarding medicines are outlined above.   Disposition: Follow-up with Dr. Harl Bowie in 6 to 8 weeks after dobutamine stress test  Signed, Levell July, NP 07/31/2019 10:16 AM    [facility] [place], [place], [place] [address] Phone: [phone]; Fax: [phone]

## 2019-07-31 ENCOUNTER — Encounter: Payer: Self-pay | Admitting: Family Medicine

## 2019-07-31 ENCOUNTER — Encounter: Payer: Self-pay | Admitting: *Deleted

## 2019-07-31 ENCOUNTER — Other Ambulatory Visit: Payer: Self-pay

## 2019-07-31 ENCOUNTER — Ambulatory Visit (INDEPENDENT_AMBULATORY_CARE_PROVIDER_SITE_OTHER): Payer: Medicare HMO | Admitting: Family Medicine

## 2019-07-31 VITALS — BP 128/50 | HR 94 | Ht 75.0 in | Wt 172.0 lb

## 2019-07-31 DIAGNOSIS — I6523 Occlusion and stenosis of bilateral carotid arteries: Secondary | ICD-10-CM | POA: Diagnosis not present

## 2019-07-31 DIAGNOSIS — I2510 Atherosclerotic heart disease of native coronary artery without angina pectoris: Secondary | ICD-10-CM

## 2019-07-31 DIAGNOSIS — E782 Mixed hyperlipidemia: Secondary | ICD-10-CM

## 2019-07-31 DIAGNOSIS — I498 Other specified cardiac arrhythmias: Secondary | ICD-10-CM

## 2019-07-31 NOTE — Patient Instructions (Addendum)
Your physician wants you to follow-up in: [place] will receive a reminder letter in the mail two months in advance. If you don't receive a letter, please call our office to schedule the follow-up appointment.  Your physician recommends that you continue on your current medications as directed. Please refer to the Current Medication list given to you today.  Your physician recommends that you return for lab work BMP/MG  Your physician has requested that you have a dobutamine myoview. For furth information please visit [email or web address]. Please follow instruction sheet, as given.  Thank you for choosing [facility]!!

## 2019-08-01 ENCOUNTER — Telehealth: Payer: Self-pay | Admitting: Family Medicine

## 2019-08-01 NOTE — Telephone Encounter (Signed)
Pre-cert Verification for the following procedure    DOBUTAMINE STRESS   DATE:   08/08/2019  LOCATION: [facility]

## 2019-08-07 ENCOUNTER — Telehealth: Payer: Self-pay | Admitting: *Deleted

## 2019-08-07 ENCOUNTER — Ambulatory Visit (INDEPENDENT_AMBULATORY_CARE_PROVIDER_SITE_OTHER): Payer: Medicare HMO | Admitting: *Deleted

## 2019-08-07 ENCOUNTER — Other Ambulatory Visit: Payer: Self-pay

## 2019-08-07 DIAGNOSIS — I498 Other specified cardiac arrhythmias: Secondary | ICD-10-CM | POA: Diagnosis not present

## 2019-08-07 NOTE — Telephone Encounter (Signed)
Per [place]. He was in ventricular bigeminy at the time of his last visit and is scheduled for a dobutamine stress test tomorrow which can cause progressive arrhythmias. I spoke with SK since he is the DOD and he recommended not doing the stress test if he is still in bigeminy. Therefore, I recommended to Joseph Forbes he come in for a repeat EKG today. If still in bigeminy, then would likely get a regular echo and follow-up labs. If in a normal rhythm, then will proceed with stress testing as planned.  Patient informed and verbalized understanding.

## 2019-08-08 ENCOUNTER — Encounter (HOSPITAL_COMMUNITY)
Admission: RE | Admit: 2019-08-08 | Discharge: 2019-08-08 | Disposition: A | Discharge status: Home / Self Care | Payer: Medicare HMO | Source: Ambulatory Visit | Attending: Family Medicine | Admitting: Family Medicine

## 2019-08-08 ENCOUNTER — Encounter (HOSPITAL_BASED_OUTPATIENT_CLINIC_OR_DEPARTMENT_OTHER)
Admission: RE | Admit: 2019-08-08 | Discharge: 2019-08-08 | Disposition: A | Discharge status: Home / Self Care | Payer: Medicare HMO | Source: Ambulatory Visit | Attending: Family Medicine | Admitting: Family Medicine

## 2019-08-08 DIAGNOSIS — I498 Other specified cardiac arrhythmias: Secondary | ICD-10-CM

## 2019-08-08 DIAGNOSIS — I2510 Atherosclerotic heart disease of native coronary artery without angina pectoris: Secondary | ICD-10-CM

## 2019-08-08 LAB — NM MYOCAR MULTI W/SPECT W/WALL MOTION / EF
LV dias vol: 152 mL (ref 62–150)
LV sys vol: 97 mL
Peak HR: 131 {beats}/min
RATE: 0.49
Rest HR: 70 {beats}/min
SDS: 4
SRS: 6
SSS: 10
TID: 0.97

## 2019-08-08 MED ORDER — DOBUTAMINE IN D5W 4-5 MG/ML-% IV SOLN
INTRAVENOUS | Status: AC
  Administered 2019-08-08: 1000 mg via INTRAVENOUS
  Filled 2019-08-08: qty 250

## 2019-08-08 MED ORDER — TECHNETIUM TC 99M TETROFOSMIN IV KIT
30.0000 | PACK | Freq: Once | INTRAVENOUS | Status: AC | PRN
  Administered 2019-08-08: 31 via INTRAVENOUS

## 2019-08-08 MED ORDER — TECHNETIUM TC 99M TETROFOSMIN IV KIT
10.0000 | PACK | Freq: Once | INTRAVENOUS | Status: AC | PRN
  Administered 2019-08-08: 10.7 via INTRAVENOUS

## 2019-08-09 ENCOUNTER — Telehealth: Payer: Self-pay | Admitting: *Deleted

## 2019-08-09 DIAGNOSIS — I498 Other specified cardiac arrhythmias: Secondary | ICD-10-CM | POA: Diagnosis not present

## 2019-08-09 DIAGNOSIS — I2510 Atherosclerotic heart disease of native coronary artery without angina pectoris: Secondary | ICD-10-CM | POA: Diagnosis not present

## 2019-08-09 NOTE — Telephone Encounter (Signed)
Patient informed. Copy sent to PCP °

## 2019-08-09 NOTE — Telephone Encounter (Signed)
-----   Message from Verta Ellen., NP sent at 08/08/2019  8:49 PM EDT ----- Please call the patient and tell him the stress test did not show any new areas with lack of  blood flow through the coronary arteries.It only showed evidence of his prior heart attack.

## 2019-08-17 DIAGNOSIS — K2100 Gastro-esophageal reflux disease with esophagitis, without bleeding: Secondary | ICD-10-CM | POA: Diagnosis not present

## 2019-08-17 DIAGNOSIS — E7849 Other hyperlipidemia: Secondary | ICD-10-CM | POA: Diagnosis not present

## 2019-08-21 ENCOUNTER — Other Ambulatory Visit: Payer: Self-pay

## 2019-08-21 ENCOUNTER — Ambulatory Visit: Payer: Medicare HMO | Admitting: Cardiology

## 2019-08-21 ENCOUNTER — Telehealth: Payer: Self-pay | Admitting: Cardiology

## 2019-08-21 ENCOUNTER — Encounter: Payer: Self-pay | Admitting: *Deleted

## 2019-08-21 ENCOUNTER — Encounter: Payer: Self-pay | Admitting: Cardiology

## 2019-08-21 VITALS — BP 130/70 | HR 84 | Ht 75.0 in | Wt 171.2 lb

## 2019-08-21 DIAGNOSIS — I2510 Atherosclerotic heart disease of native coronary artery without angina pectoris: Secondary | ICD-10-CM

## 2019-08-21 DIAGNOSIS — I493 Ventricular premature depolarization: Secondary | ICD-10-CM | POA: Diagnosis not present

## 2019-08-21 DIAGNOSIS — I6523 Occlusion and stenosis of bilateral carotid arteries: Secondary | ICD-10-CM | POA: Diagnosis not present

## 2019-08-21 NOTE — Patient Instructions (Addendum)
Medication Instructions:   Your physician recommends that you continue on your current medications as directed. Please refer to the Current Medication list given to you today.  Labwork:  NONE  Testing/Procedures:  NONE  Follow-Up:  Your physician recommends that you schedule a follow-up appointment in: 6 months (office or virtual). You will receive a reminder letter in the mail in about 4 months reminding you to call and schedule your appointment. If you don't receive this letter, please contact our office.  Any Other Special Instructions Will Be Listed Below (If Applicable).  You have been referred to your vascular doctor again.  If you need a refill on your cardiac medications before your next appointment, please call your pharmacy.

## 2019-08-21 NOTE — Addendum Note (Signed)
Addended by: Merlene Laughter on: 08/21/2019 11:23 AM   Modules accepted: Orders

## 2019-08-21 NOTE — Addendum Note (Signed)
Addended by: Merlene Laughter on: 08/21/2019 01:26 PM   Modules accepted: Orders

## 2019-08-21 NOTE — Progress Notes (Signed)
Clinical Summary Mr. Gerke is a 79 y.o.male seen today for follow up of the following medical problems.  1. CAD: prior stents in 2000 and 2001. Last cath 2008 w/ nonobstructive disease. Normal LVEF 65% by echo 2012. - nuclear stress 03/2015 which showed no specific ischemia.  09/2017 dobutmaine nuclear stress inferoseptal scar, small apical infarct with mild peri-infarct ischemia. Low risk study.  - some recent chest pain - episode Sunday watching tv. Sharp pain across chest, 8-9/10. No other associated symptoms. Not positional. Pain lasted about 2 minutes, took NG. 2 episodes over the 6 weeks. - can have some SOB/DOE which is stable, better with inhaler.  - looks like imdur stopped after ER visit Jan 2019 with GI bleed .   During 07/2019 visit with PA Leonides Sake noted to have frequent PVCs by EKG, referred for stress testing. K was 3.9, Mg 1.9 07/2019 dobutamine nuclear stress: inferior scar, no current ischemia. LVEF 37% by nuclear - no chest pains, no SOB or DOE  2. Carotid stenosis  - prior bilateral carotid endarterectomies, recent US shows good patency. Followedby vascular   03/2018 carotid US mild blcokages bilaterally, patent CEA - has not followed up with vascular since 03/2018  3. HL - labs followed by pcp - compliant with statin   4. 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 CAD (coronary artery disease)    Normal LV function .Status post stent placement to the right coronary artery in 2000 and 2001Obstructive coronary artery disease in November 2008 by catheterization   . Carotid artery disease ([place])   . COPD (chronic obstructive pulmonary disease) ([place])   . GERD (gastroesophageal reflux disease)   . Hyperlipidemia   . Hypertension    pt denies, no meds     Allergies  Allergen Reactions  . Clarithromycin Nausea Only  . Other  Itching, Rash and Other (See Comments)    Some medication OTC he took for headache caused a rash     Current Outpatient Medications  Medication Sig Dispense Refill  . aspirin 81 MG tablet Take 1 tablet (81 mg total) by mouth daily. 30 tablet   . atorvastatin (LIPITOR) 80 MG tablet TAKE ONE TABLET BY MOUTH DAILY 15 tablet 0  . ipratropium (ATROVENT) 0.02 % nebulizer solution Inhale 3 mLs into the lungs every 6 (six) hours as needed.    . nitroGLYCERIN (NITROSTAT) 0.4 MG SL tablet DISSOLVE 1 TABLET BY MOUTH UNDER TONGUE EVERY 5 MINUTES AS NEEDED FOR CHEST PAIN. DO NOT EXCEED A TOTAL OF 3 DOSES IN 15 MIN 25 tablet 0  . pantoprazole (PROTONIX) 40 MG tablet TAKE 1 TABLET BY MOUTH DAILY BEFORE BREAKFAST 30 tablet 5  . PROAIR HFA 108 (90 Base) MCG/ACT inhaler Inhale 2 puffs into the lungs every 6 (six) hours as needed for wheezing or shortness of breath.     Grant Ruts INHUB 250-50 MCG/DOSE AEPB Inhale 1 puff into the lungs 2 (two) times a day.     No current facility-administered medications for this visit.     Past Surgical History:  Procedure Laterality Date  . CARDIAC CATHETERIZATION  02/2007  . cardiac stents     2 stents placed   . CAROTID ENDARTERECTOMY Bilateral 2010      ([facility])  CE  . COLONOSCOPY N/A 05/23/2017   Procedure: COLONOSCOPY;  Surgeon: Hildred Laser  U, MD;  [facility];  Service: Endoscopy;  Laterality: N/A;  . ESOPHAGOGASTRODUODENOSCOPY N/A 05/23/2017   Procedure: ESOPHAGOGASTRODUODENOSCOPY (EGD);  Surgeon: Rogene Houston, MD;  [facility];  Service: Endoscopy;  Laterality: N/A;  . FRACTURE SURGERY     right arm  . HYDROCELE EXCISION Right 04/24/2013   Procedure: HYDROCELECTOMY ADULT;  Surgeon: Hanley Ben, MD;  Location[facility];  Service: Urology;  Laterality: Right;  . JOINT REPLACEMENT Left Nov. 3, 2015   Hip  . TOTAL HIP ARTHROPLASTY Left 02/19/2014   Procedure: TOTAL HIP ARTHROPLASTY;  Surgeon: Garald Balding, MD;  Location: [facility];  Service: Orthopedics;  Laterality: Left;     Allergies  Allergen Reactions  . Clarithromycin Nausea Only  . Other Itching, Rash and Other (See Comments)    Some medication OTC he took for headache caused a rash      Family History  Problem Relation Age of Onset  . Heart disease Mother        Before age 62  . Heart disease Father   . Heart attack Father   . Colon cancer Neg Hx      Social History Mr. Leavins reports that he has been smoking cigarettes. He started smoking about 66 years ago. He has a 55.00 pack-year smoking history. His smokeless tobacco use includes chew. Mr. Neiderhiser reports no history of alcohol use.   Review of Systems CONSTITUTIONAL: No weight loss, fever, chills, weakness or fatigue.  HEENT: Eyes: No visual loss, blurred vision, double vision or yellow sclerae.No hearing loss, sneezing, congestion, runny nose or sore throat.  SKIN: No rash or itching.  CARDIOVASCULAR: per hpi RESPIRATORY: No shortness of breath, cough or sputum.  GASTROINTESTINAL: No anorexia, nausea, vomiting or diarrhea. No abdominal pain or blood.  GENITOURINARY: No burning on urination, no polyuria NEUROLOGICAL: No headache, dizziness, syncope, paralysis, ataxia, numbness or tingling in the extremities. No change in bowel or bladder control.  MUSCULOSKELETAL: No muscle, back pain, joint pain or stiffness.  LYMPHATICS: No enlarged nodes. No history of splenectomy.  PSYCHIATRIC: No history of depression or anxiety.  ENDOCRINOLOGIC: No reports of sweating, cold or heat intolerance. No polyuria or polydipsia.  Marland Kitchen   Physical Examination Today's Vitals   08/21/19 1017  BP: 130/70  Pulse: 84  SpO2: 93%  Weight: 171 lb 3.2 oz (77.7 kg)  Height: 6\' 3"  (1.905 m)   Body mass index is 21.4 kg/m.  Gen: resting comfortably, no acute distress HEENT: no scleral icterus, pupils equal round and reactive, no palptable cervical adenopathy,  CV: RRR, no mr/g,  no jvd Resp: Clear to auscultation bilaterally GI: abdomen is soft, non-tender, non-distended, normal bowel sounds, no hepatosplenomegaly MSK: extremities are warm, no edema.  Skin: warm, no rash Neuro:  no focal deficits Psych: appropriate affect   Diagnostic Studies  12/12 DSE: Baseline LVEF 55-60%. Negative for ischemia.   07/2012 Carotids: patent bilaterally w/ minimal plaque/hyperplasia, vertebals normal bilateral   01/02/13 EKG: SR, normal axis, no ischemic changes  03/2015 Dobutamine nuclear stress  Defect 1: There is a small defect of moderate severity present in the basal inferoseptal, mid inferoseptal, apical septal and apical inferior location. This is most likely due to soft tissue attenuation artifact. However, myocardial scar cannot entirely be ruled out.  This is a low risk study. No ischemic territories.  Nuclear stress EF: 58%.   09/2017 Dobutamine nuclear stress  There was no ST segment deviation noted during stress.  Findings consistent with prior inferoseptal infarct without current ischemia. Small apical infarct with mild peri-infarct ischemia.  This is a low risk study.  The left ventricular ejection fraction is normal (55-65%).   07/2019 nuclear stress  Horizontal ST segment depression ST segment depression of 0.5 mm was noted during stress in the V4 and V5 leads.  Inversions in leads2, 3, and aVF seen at baseline and slightly more pronounced with dobutamine.  Defect 1: There is a large defect of severe severity present in the basal inferoseptal, basal inferior, mid inferoseptal, mid inferior and apical inferior location.  Findings consistent with prior myocardial infarction. No significant ischemic territories.  This is a high risk study.  Nuclear stress EF: 37%.   Assessment and Plan  1. CAD -07/2019 stress test without ischemia, did suggest low LVEF though nuclear can be unreliable in this measure - will obtain echo to clarify LVEF.      2. Carotid stenosis - reestablish with vascular  3. Hyperlipidemia -request pcp labs, continue statin  4. PVCs - asymptomatic, no signs of ischemia by stress - f/u echo.     Arnoldo Lenis, M.D.

## 2019-08-21 NOTE — Telephone Encounter (Signed)
  Precert needed for: 2 D Echo dx: CAD   Location: [facility]    Date: September 19, 2019

## 2019-09-10 ENCOUNTER — Ambulatory Visit: Payer: Medicare HMO | Admitting: Cardiology

## 2019-09-12 DIAGNOSIS — I2510 Atherosclerotic heart disease of native coronary artery without angina pectoris: Secondary | ICD-10-CM | POA: Diagnosis not present

## 2019-09-12 DIAGNOSIS — R42 Dizziness and giddiness: Secondary | ICD-10-CM | POA: Diagnosis not present

## 2019-09-12 DIAGNOSIS — E559 Vitamin D deficiency, unspecified: Secondary | ICD-10-CM | POA: Diagnosis not present

## 2019-09-12 DIAGNOSIS — J449 Chronic obstructive pulmonary disease, unspecified: Secondary | ICD-10-CM | POA: Diagnosis not present

## 2019-09-12 DIAGNOSIS — I679 Cerebrovascular disease, unspecified: Secondary | ICD-10-CM | POA: Diagnosis not present

## 2019-09-12 DIAGNOSIS — Z6820 Body mass index (BMI) 20.0-20.9, adult: Secondary | ICD-10-CM | POA: Diagnosis not present

## 2019-09-17 DIAGNOSIS — I2510 Atherosclerotic heart disease of native coronary artery without angina pectoris: Secondary | ICD-10-CM | POA: Diagnosis not present

## 2019-09-17 DIAGNOSIS — R7301 Impaired fasting glucose: Secondary | ICD-10-CM | POA: Diagnosis not present

## 2019-09-17 DIAGNOSIS — E7849 Other hyperlipidemia: Secondary | ICD-10-CM | POA: Diagnosis not present

## 2019-09-17 DIAGNOSIS — Z720 Tobacco use: Secondary | ICD-10-CM | POA: Diagnosis not present

## 2019-09-18 ENCOUNTER — Other Ambulatory Visit: Payer: Self-pay

## 2019-09-18 ENCOUNTER — Ambulatory Visit (INDEPENDENT_AMBULATORY_CARE_PROVIDER_SITE_OTHER): Payer: Medicare HMO

## 2019-09-18 DIAGNOSIS — I2510 Atherosclerotic heart disease of native coronary artery without angina pectoris: Secondary | ICD-10-CM

## 2019-09-19 ENCOUNTER — Other Ambulatory Visit: Payer: Medicare HMO

## 2019-10-02 ENCOUNTER — Telehealth: Payer: Self-pay | Admitting: *Deleted

## 2019-10-02 NOTE — Telephone Encounter (Signed)
Pt voiced understanding - scheduled with Katina Dung, NP 6/25

## 2019-10-02 NOTE — Telephone Encounter (Signed)
-----   Message from Arnoldo Lenis, MD sent at 10/01/2019 11:19 AM EDT ----- Echo shows pumping function of heart is mildly decreased, can he get f/u with Jonni Sanger 2-3 weeks to discuss and make some medication adjustments to help strengthen his heart   Zandra Abts MD

## 2019-10-11 NOTE — Progress Notes (Signed)
Cardiology Office Note  Date: 10/12/2019   ID: Hafiz, Irion 05-04-1940, MRN [id number]  PCP:  Manon Hilding, MD  Cardiologist:  Carlyle Dolly, MD Electrophysiologist:  None   Chief Complaint: F/U CAD   History of Present Illness: Joseph Forbes is a 79 y.o. male with a history of CAD was prior stents in 2000, 2001.  Last cardiac cath 2018 with nonobstructive disease.,  Normal EF 65% by echo 2012, nuclear stress 04/08/2015 no specific ischemia.  Dobutamine nuclear stress 2019 inferoseptal scar, small apical infarct with mild peri-infarct ischemia, low risk study.  Saw Dr. Harl Bowie on 08/21/2019 in follow-up.  Patient had some recent chest pain while watching TV described as a sharp pain across his chest 8-9 out of 10 scale.  Had no other associated symptoms, pain lasted about 2 minutes.  He took nitroglycerin.  Had experienced 2 episodes over the prior 6 weeks.  Described he could have some SOB or DOE which was stable but better with inhaler.  Carotid artery disease with prior bilateral carotid endarterectomies.  Recent carotid study showed good patency. Followed by vascular.  Had not followed up with vascular since December 2018.  He was compliant with statin medication.  Previous PFTs showed severe emphysema.  Stress test on 08/07/2019 showed low EF.  Echo was ordered to clarify LV function.  Patient was to reestablish with vascular for carotid stenosis.  Echo 09/18/2019: EF 40 to 45%, positive for WMA's, grade 1 DD, moderate MR, trivial AR.  Comparison nuclear stress test on 08/07/2019 EF of 37%   Patient denies any chest pain since the last visit on Aug 21, 2019 with Dr. Harl Bowie.  He denies any anginal symptoms or equivalents other than shortness of breath on exertion.  He has severe emphysematous COPD.  He has chronic inspiratory and expiratory wheezing.  He does not follow with a pulmonologist.  He denies any CVA or TIA-like symptoms.  He does state he sometimes becomes transiently  dizzy when attempting to stand erect from a sitting position or bending over but no presyncopal or syncopal episodes.  Denies any PND, orthopnea, bleeding, claudication-like symptoms, or lower extremity edema.  We discussed the fact that his echocardiogram showed decreased pumping function and correlated fairly closely with recent nuclear stress test calculated EF.  Past Medical History:  Diagnosis Date  . CAD (coronary artery disease)    Normal LV function .Status post stent placement to the right coronary artery in 2000 and 2001Obstructive coronary artery disease in November 2008 by catheterization   . Carotid artery disease ([place])   . COPD (chronic obstructive pulmonary disease) ([place])   . GERD (gastroesophageal reflux disease)   . Hyperlipidemia   . Hypertension    pt denies, no meds    Past Surgical History:  Procedure Laterality Date  . CARDIAC CATHETERIZATION  02/2007  . cardiac stents     2 stents placed   . CAROTID ENDARTERECTOMY Bilateral 2010      (here [facility])  CE  . COLONOSCOPY N/A 05/23/2017   Procedure: COLONOSCOPY;  Surgeon: Rogene Houston, MD;  [facility];  Service: Endoscopy;  Laterality: N/A;  . ESOPHAGOGASTRODUODENOSCOPY N/A 05/23/2017   Procedure: ESOPHAGOGASTRODUODENOSCOPY (EGD);  Surgeon: Rogene Houston, MD;  [facility];  Service: Endoscopy;  Laterality: N/A;  . FRACTURE SURGERY     right arm  . HYDROCELE EXCISION Right 04/24/2013   Procedure: HYDROCELECTOMY ADULT;  Surgeon: Hanley Ben, MD;  Location: [facility];  Service: Urology;  Laterality: Right;  . JOINT REPLACEMENT Left Nov. 3, 2015   Hip  . TOTAL HIP ARTHROPLASTY Left 02/19/2014   Procedure: TOTAL HIP ARTHROPLASTY;  Surgeon: Garald Balding, MD;  Location: [facility];  Service: Orthopedics;  Laterality: Left;    Current Outpatient Medications  Medication Sig Dispense Refill  . aspirin 81 MG tablet Take 1 tablet (81 mg total) by mouth daily. 30 tablet   .  atorvastatin (LIPITOR) 80 MG tablet TAKE ONE TABLET BY MOUTH DAILY 15 tablet 0  . ipratropium (ATROVENT) 0.02 % nebulizer solution Inhale 3 mLs into the lungs every 6 (six) hours as needed.    . nitroGLYCERIN (NITROSTAT) 0.4 MG SL tablet DISSOLVE 1 TABLET BY MOUTH UNDER TONGUE EVERY 5 MINUTES AS NEEDED FOR CHEST PAIN. DO NOT EXCEED A TOTAL OF 3 DOSES IN 15 MIN 25 tablet 0  . pantoprazole (PROTONIX) 40 MG tablet TAKE 1 TABLET BY MOUTH DAILY BEFORE BREAKFAST 30 tablet 5  . PROAIR HFA 108 (90 Base) MCG/ACT inhaler Inhale 2 puffs into the lungs every 6 (six) hours as needed for wheezing or shortness of breath.     Grant Ruts INHUB 250-50 MCG/DOSE AEPB Inhale 1 puff into the lungs 2 (two) times a day.    . ezetimibe (ZETIA) 10 MG tablet Take 10 mg by mouth daily.    . Fluticasone-Umeclidin-Vilant (TRELEGY ELLIPTA IN) Inhale into the lungs.     No current facility-administered medications for this visit.   Allergies:  Clarithromycin and Other   Social History: The patient  reports that he has been smoking cigarettes. He started smoking about 66 years ago. He has a 55.00 pack-year smoking history. His smokeless tobacco use includes chew. He reports that he does not drink alcohol and does not use drugs.   Family History: The patient's family history includes Heart attack in his father; Heart disease in his father and mother.   ROS:  Please see the history of present illness. Otherwise, complete review of systems is positive for none.  All other systems are reviewed and negative.   Physical Exam: VS:  BP 130/60   Pulse 94   Ht 6\' 3"  (1.905 m)   Wt 174 lb (78.9 kg)   SpO2 91%   BMI 21.75 kg/m , BMI Body mass index is 21.75 kg/m.  Wt Readings from Last 3 Encounters:  10/12/19 174 lb (78.9 kg)  08/21/19 171 lb 3.2 oz (77.7 kg)  07/31/19 172 lb (78 kg)    General: Patient appears comfortable at rest. Neck: Supple, no elevated JVP or carotid bruits, no thyromegaly. Lungs: Audible inspiratory and  expiratory wheezing bilaterally throughout, nonlabored breathing at rest. Cardiac: Regular rate and rhythm, no S3 or significant systolic murmur, no pericardial rub. Extremities: No pitting edema, distal pulses 2+. Skin: Warm and dry. Musculoskeletal: No kyphosis. Neuropsychiatric: Alert and oriented x3, affect grossly appropriate.  ECG:    Recent Labwork: No results found for requested labs within last 8760 hours.  No results found for: CHOL, TRIG, HDL, CHOLHDL, VLDL, LDLCALC, LDLDIRECT  Other Studies Reviewed Today:  Echo 09/18/2019  1. Left ventricular ejection fraction, by estimation, is 40 to 45%. The left ventricle has moderately decreased function. The left ventricle demonstrates regional wall motion abnormalities (see scoring diagram/findings for description). Left ventricular diastolic parameters are consistent with Grade I diastolic dysfunction (impaired relaxation). 2. Right ventricular systolic function is normal. The right ventricular size is normal. There is normal pulmonary artery systolic  pressure. The estimated right ventricular systolic pressure is 91.4 mmHg. 3. Left atrial size was moderately dilated. 4. Right atrial size was mildly dilated. 5. The mitral valve is grossly normal, but mildly thickened with restriction of posterior leaflet motion. Moderate mitral valve regurgitation. 6. The aortic valve is tricuspid. Aortic valve regurgitation is trivial. 7. The inferior vena cava is normal in size with greater than 50% respiratory variability, suggesting right atrial pressure of 3 mmHg. Comparison(s): Nuclear stress test done 07/20/19 showed an EF of 37%.     Diagnostic Studies  12/12 DSE: Baseline LVEF 55-60%. Negative for ischemia.   07/2012 Carotids: patent bilaterally w/ minimal plaque/hyperplasia, vertebals normal bilateral   01/02/13 EKG: SR, normal axis, no ischemic changes  03/2015 Dobutamine nuclear stress  Defect 1: There is a small defect of  moderate severity present in the basal inferoseptal, mid inferoseptal, apical septal and apical inferior location. This is most likely due to soft tissue attenuation artifact. However, myocardial scar cannot entirely be ruled out.  This is a low risk study. No ischemic territories.  Nuclear stress EF: 58%.   09/2017 Dobutamine nuclear stress  There was no ST segment deviation noted during stress.  Findings consistent with prior inferoseptal infarct without current ischemia. Small apical infarct with mild peri-infarct ischemia.  This is a low risk study.  The left ventricular ejection fraction is normal (55-65%).   07/2019 nuclear stress  Horizontal ST segment depression ST segment depression of 0.5 mm was noted during stress in the V4 and V5 leads.  Inversions in leads2, 3, and aVF seen at baseline and slightly more pronounced with dobutamine.  Defect 1: There is a large defect of severe severity present in the basal inferoseptal, basal inferior, mid inferoseptal, mid inferior and apical inferior location.  Findings consistent with prior myocardial infarction. No significant ischemic territories.  This is a high risk study.  Nuclear stress EF: 37%.   Assessment and Plan:  1. CAD in native artery   2. Bilateral carotid artery stenosis   3. Mixed hyperlipidemia   4. PVC's (premature ventricular contractions)   5. Chronic obstructive pulmonary disease, unspecified COPD type ([employer])   6. Cardiomyopathy, unspecified type ([place])    1. CAD in native artery Patient denies any progressive anginal symptoms.  He states since the last episode of chest pain he has not had any more episodes.  Has not needed nitroglycerin.  He does have dyspnea on exertion due to significant COPD.  Continue aspirin 81 mg.  Nitroglycerin sublingual as needed.  Recent echocardiogram showed EF of 40 to 45%, grade 1 DD.  See report above   2. Bilateral carotid artery stenosis He will be following soon with  vascular surgery for bilateral carotid artery disease status post bilateral carotid endarterectomies.   3. Mixed hyperlipidemia  Recent lipid panel 06/18/2019 TC 173 TG 97 HDL 61 LDL 94.  Continue atorvastatin 80 mg daily  4. PVC's (premature ventricular contractions) Denies any recent palpitations or arrhythmias.  5.  COPD History of significant smoking and COPD.  Has audible inspiratory and expiratory wheezing on exam.  States he uses inhalers.  He does not have a pulmonologist per his statement  6.  Cardiomyopathy   Recent echocardiogram showed EF of 40 to 45%, grade 1 DD, WMA's moderate MR.  See report above.  Recent nuclear stress test considered a high risk study showed a large defect of severe severity present in the basal inferoseptal, basal inferior, mid inferior septal and mid inferior and  apical inferior locations.  Findings consistent with prior MI, no significant ischemic territories. Calculated EF of 37%    Medication Adjustments/Labs and Tests Ordered:  Current medicines are reviewed at length with the patient today.  Concerns regarding medicines are outlined above.   Disposition: Follow-up with Dr. Harl Bowie or [facility] 6 months  Signed, Joseph July, NP 10/12/2019 12:56 PM    [facility] [place], [place], [place] [address] Phone: [phone]; Fax: [phone]

## 2019-10-12 ENCOUNTER — Telehealth: Payer: Self-pay | Admitting: Family Medicine

## 2019-10-12 ENCOUNTER — Other Ambulatory Visit: Payer: Self-pay

## 2019-10-12 ENCOUNTER — Encounter: Payer: Self-pay | Admitting: Family Medicine

## 2019-10-12 ENCOUNTER — Ambulatory Visit: Payer: Medicare HMO | Admitting: Family Medicine

## 2019-10-12 VITALS — BP 130/60 | HR 94 | Ht 75.0 in | Wt 174.0 lb

## 2019-10-12 DIAGNOSIS — E782 Mixed hyperlipidemia: Secondary | ICD-10-CM

## 2019-10-12 DIAGNOSIS — I6523 Occlusion and stenosis of bilateral carotid arteries: Secondary | ICD-10-CM

## 2019-10-12 DIAGNOSIS — I2510 Atherosclerotic heart disease of native coronary artery without angina pectoris: Secondary | ICD-10-CM

## 2019-10-12 DIAGNOSIS — I429 Cardiomyopathy, unspecified: Secondary | ICD-10-CM | POA: Diagnosis not present

## 2019-10-12 DIAGNOSIS — J449 Chronic obstructive pulmonary disease, unspecified: Secondary | ICD-10-CM | POA: Diagnosis not present

## 2019-10-12 DIAGNOSIS — I493 Ventricular premature depolarization: Secondary | ICD-10-CM | POA: Diagnosis not present

## 2019-10-12 NOTE — Patient Instructions (Addendum)
Your physician recommends that you schedule a follow-up appointment in: 6 MONTHS WITH DR BRANCH  Your physician recommends that you continue on your current medications as directed. Please refer to the Current Medication list given to you today.  Thank you for choosing [facility]!!    

## 2019-10-12 NOTE — Telephone Encounter (Signed)
Pt wife says pcp started Zetia 10 mg and also using Trelegy inhaler - updated medication list and aware that amlodipine/benazepril was not on pt medication list - verified that pt name was on AVS

## 2019-10-12 NOTE — Telephone Encounter (Signed)
New message   Patients wife called said that this medication was listed on her husband's paperwork as medication that he takes? He does not take this medicine  Amlodipine benazetril (wife spelled this out) 40 mg capsule - take 1 capsule by mouth daily ?

## 2019-10-15 ENCOUNTER — Other Ambulatory Visit: Payer: Self-pay | Admitting: *Deleted

## 2019-10-15 DIAGNOSIS — J449 Chronic obstructive pulmonary disease, unspecified: Secondary | ICD-10-CM | POA: Diagnosis not present

## 2019-10-15 DIAGNOSIS — K2100 Gastro-esophageal reflux disease with esophagitis, without bleeding: Secondary | ICD-10-CM | POA: Diagnosis not present

## 2019-10-15 DIAGNOSIS — R7301 Impaired fasting glucose: Secondary | ICD-10-CM | POA: Diagnosis not present

## 2019-10-15 DIAGNOSIS — I2510 Atherosclerotic heart disease of native coronary artery without angina pectoris: Secondary | ICD-10-CM | POA: Diagnosis not present

## 2019-10-15 DIAGNOSIS — E782 Mixed hyperlipidemia: Secondary | ICD-10-CM | POA: Diagnosis not present

## 2019-10-15 DIAGNOSIS — I6523 Occlusion and stenosis of bilateral carotid arteries: Secondary | ICD-10-CM

## 2019-10-17 DIAGNOSIS — Z720 Tobacco use: Secondary | ICD-10-CM | POA: Diagnosis not present

## 2019-10-17 DIAGNOSIS — R7301 Impaired fasting glucose: Secondary | ICD-10-CM | POA: Diagnosis not present

## 2019-10-17 DIAGNOSIS — E7849 Other hyperlipidemia: Secondary | ICD-10-CM | POA: Diagnosis not present

## 2019-10-17 DIAGNOSIS — I2510 Atherosclerotic heart disease of native coronary artery without angina pectoris: Secondary | ICD-10-CM | POA: Diagnosis not present

## 2019-10-18 DIAGNOSIS — E782 Mixed hyperlipidemia: Secondary | ICD-10-CM | POA: Diagnosis not present

## 2019-10-18 DIAGNOSIS — I679 Cerebrovascular disease, unspecified: Secondary | ICD-10-CM | POA: Diagnosis not present

## 2019-10-18 DIAGNOSIS — I2510 Atherosclerotic heart disease of native coronary artery without angina pectoris: Secondary | ICD-10-CM | POA: Diagnosis not present

## 2019-10-18 DIAGNOSIS — B0229 Other postherpetic nervous system involvement: Secondary | ICD-10-CM | POA: Diagnosis not present

## 2019-10-18 DIAGNOSIS — R7301 Impaired fasting glucose: Secondary | ICD-10-CM | POA: Diagnosis not present

## 2019-10-18 DIAGNOSIS — J449 Chronic obstructive pulmonary disease, unspecified: Secondary | ICD-10-CM | POA: Diagnosis not present

## 2019-10-18 DIAGNOSIS — Z6820 Body mass index (BMI) 20.0-20.9, adult: Secondary | ICD-10-CM | POA: Diagnosis not present

## 2019-10-23 ENCOUNTER — Ambulatory Visit (INDEPENDENT_AMBULATORY_CARE_PROVIDER_SITE_OTHER): Payer: Medicare HMO | Admitting: Physician Assistant

## 2019-10-23 ENCOUNTER — Other Ambulatory Visit: Payer: Self-pay

## 2019-10-23 ENCOUNTER — Ambulatory Visit (HOSPITAL_COMMUNITY)
Admission: RE | Admit: 2019-10-23 | Discharge: 2019-10-23 | Disposition: A | Discharge status: Home / Self Care | Payer: Medicare HMO | Source: Ambulatory Visit | Attending: Vascular Surgery | Admitting: Vascular Surgery

## 2019-10-23 VITALS — BP 120/64 | HR 66 | Temp 98.0°F | Resp 20 | Ht 75.0 in | Wt 173.8 lb

## 2019-10-23 DIAGNOSIS — F17200 Nicotine dependence, unspecified, uncomplicated: Secondary | ICD-10-CM

## 2019-10-23 DIAGNOSIS — I6523 Occlusion and stenosis of bilateral carotid arteries: Secondary | ICD-10-CM

## 2019-10-23 NOTE — Progress Notes (Signed)
HISTORY AND PHYSICAL     CC:  follow up. Requesting Provider:  Manon Hilding, MD  HPI: This is a 79 y.o. male here for follow up for carotid artery stenosis.  Pt is s/p bilateral CEA by Dr. Kellie Simmering in 2010.  Pt was last seen in December 2019 and at that time he did not have any sx of stroke.  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   In June 2019, he had a dobutamine nuclear stress revealing inferoseptal scar, small apical infarct with mild peri-infarct ischemia and was a low risk study.   Echo in June 2021 reavealed EF of 40-45% with moderate MR and trivial AI.  Pt returns today for follow up.  Pt denies any amaurosis fugax, speech difficulties, weakness, numbness, paralysis or clumsiness.  He states that he does have some shortness of breath with activity.  He has inhalers and nebulizers and is on oxygen at home.  He denies any chest pain or pain in his legs with walking.   He does have some dizziness.  The pt is on a statin for cholesterol management.  The pt is on a daily aspirin.   Other AC:  none The pt is not on medication for hypertension.   The pt is not diabetic.   Tobacco hx:  Current-he does not have an interest in quitting.   Pt does not have family hx of AAA.  Past Medical History:  Diagnosis Date  . CAD (coronary artery disease)    Normal LV function .Status post stent placement to the right coronary artery in 2000 and 2001Obstructive coronary artery disease in November 2008 by catheterization   . Carotid artery disease ([place])   . COPD (chronic obstructive pulmonary disease) ([place])   . GERD (gastroesophageal reflux disease)   . Hyperlipidemia   . Hypertension    pt denies, no meds    Past Surgical History:  Procedure Laterality Date  . CARDIAC CATHETERIZATION  02/2007  . cardiac stents     2 stents placed   . CAROTID ENDARTERECTOMY Bilateral 2010      ([facility])  CE  . COLONOSCOPY N/A 05/23/2017   Procedure: COLONOSCOPY;  Surgeon: Rogene Houston, MD;   [facility];  Service: Endoscopy;  Laterality: N/A;  . ESOPHAGOGASTRODUODENOSCOPY N/A 05/23/2017   Procedure: ESOPHAGOGASTRODUODENOSCOPY (EGD);  Surgeon: Rogene Houston, MD;  [facility];  Service: Endoscopy;  Laterality: N/A;  . FRACTURE SURGERY     right arm  . HYDROCELE EXCISION Right 04/24/2013   Procedure: HYDROCELECTOMY ADULT;  Surgeon: Hanley Ben, MD;  Location: [facility];  Service: Urology;  Laterality: Right;  . JOINT REPLACEMENT Left Nov. 3, 2015   Hip  . TOTAL HIP ARTHROPLASTY Left 02/19/2014   Procedure: TOTAL HIP ARTHROPLASTY;  Surgeon: Garald Balding, MD;  Location: [facility];  Service: Orthopedics;  Laterality: Left;    Allergies  Allergen Reactions  . Clarithromycin Nausea Only  . Other Itching, Rash and Other (See Comments)    Some medication OTC he took for headache caused a rash    Current Outpatient Medications  Medication Sig Dispense Refill  . aspirin 81 MG tablet Take 1 tablet (81 mg total) by mouth daily. 30 tablet   . atorvastatin (LIPITOR) 80 MG tablet TAKE ONE TABLET BY MOUTH DAILY 15 tablet 0  . ezetimibe (ZETIA) 10 MG tablet Take 10 mg by mouth daily.    . Fluticasone-Umeclidin-Vilant (TRELEGY ELLIPTA IN)  Inhale into the lungs.    Marland Kitchen ipratropium (ATROVENT) 0.02 % nebulizer solution Inhale 3 mLs into the lungs every 6 (six) hours as needed.    . nitroGLYCERIN (NITROSTAT) 0.4 MG SL tablet DISSOLVE 1 TABLET BY MOUTH UNDER TONGUE EVERY 5 MINUTES AS NEEDED FOR CHEST PAIN. DO NOT EXCEED A TOTAL OF 3 DOSES IN 15 MIN 25 tablet 0  . pantoprazole (PROTONIX) 40 MG tablet TAKE 1 TABLET BY MOUTH DAILY BEFORE BREAKFAST 30 tablet 5  . PROAIR HFA 108 (90 Base) MCG/ACT inhaler Inhale 2 puffs into the lungs every 6 (six) hours as needed for wheezing or shortness of breath.     Grant Ruts INHUB 250-50 MCG/DOSE AEPB Inhale 1 puff into the lungs 2 (two) times a day.     No current facility-administered medications for this visit.     Family History  Problem Relation Age of Onset  . Heart disease Mother        Before age 46  . Heart disease Father   . Heart attack Father   . Colon cancer Neg Hx     Social History   Socioeconomic History  . Marital status: Married    Spouse name: Not on file  . Number of children: Not on file  . Years of education: Not on file  . Highest education level: Not on file  Occupational History  . Not on file  Tobacco Use  . Smoking status: Current Every Day Smoker    Packs/day: 1.00    Years: 55.00    Pack years: 55.00    Types: Cigarettes    Start date: 06/19/1952  . Smokeless tobacco: Current User    Types: Chew  Vaping Use  . Vaping Use: Never used  Substance and Sexual Activity  . Alcohol use: No    Alcohol/week: 0.0 standard drinks  . Drug use: No  . Sexual activity: Not on file  Other Topics Concern  . Not on file  Social History Narrative  . Not on file   Social Determinants of Health   Financial Resource Strain:   . Difficulty of Paying Living Expenses:   Food Insecurity:   . Worried About Charity fundraiser in the Last Year:   . Arboriculturist in the Last Year:   Transportation Needs:   . Film/video editor (Medical):   Marland Kitchen Lack of Transportation (Non-Medical):   Physical Activity:   . Days of Exercise per Week:   . Minutes of Exercise per Session:   Stress:   . Feeling of Stress :   Social Connections:   . Frequency of Communication with Friends and Family:   . Frequency of Social Gatherings with Friends and Family:   . Attends Religious Services:   . Active Member of Clubs or Organizations:   . Attends Archivist Meetings:   Marland Kitchen Marital Status:   Intimate Partner Violence:   . Fear of Current or Ex-Partner:   . Emotionally Abused:   Marland Kitchen Physically Abused:   . Sexually Abused:      REVIEW OF SYSTEMS:   [X]  denotes positive finding, [ ]  denotes negative finding Cardiac  Comments:  Chest pain or chest pressure:    Shortness  of breath upon exertion: x   Short of breath when lying flat:    Irregular heart rhythm:        Vascular    Pain in calf, thigh, or hip brought on by ambulation:    Pain  in feet at night that wakes you up from your sleep:     Blood clot in your veins:    Leg swelling:         Pulmonary    Oxygen at home: x   Productive cough:     Wheezing:         Neurologic    Sudden weakness in arms or legs:     Sudden numbness in arms or legs:     Sudden onset of difficulty speaking or slurred speech:    Temporary loss of vision in one eye:     Problems with dizziness:  x       Gastrointestinal    Blood in stool:     Vomited blood:         Genitourinary    Burning when urinating:     Blood in urine:        Psychiatric    Major depression:         Hematologic    Bleeding problems:    Problems with blood clotting too easily:        Skin    Rashes or ulcers:        Constitutional    Fever or chills:      PHYSICAL EXAMINATION:  Today's Vitals   10/23/19 1024 10/23/19 1027  BP: 117/63 120/64  Pulse: 66   Resp: 20   Temp: 98 F (36.7 C)   TempSrc: Temporal   SpO2: 99%   Weight: 173 lb 12.8 oz (78.8 kg)   Height: 6\' 3"  (1.905 m)    Body mass index is 21.72 kg/m.   General:  WDWN in NAD; vital signs documented above Gait: Normal HENT: WNL, normocephalic Pulmonary: normal non-labored breathing Cardiac: regular HR, without murmur without carotid bruits Abdomen: soft, NT, no masses Skin: without rashes Vascular Exam/Pulses:  Right Left  Radial 2+ (normal) 2+ (normal)  Popliteal Unable to palpate  Unable to palpate   DP 2+ (normal) 2+ (normal)  PT Unable to palpate  Unable to palpate    Extremities: without ischemic changes, without Gangrene , without cellulitis; without open wounds;  Musculoskeletal: no muscle wasting or atrophy  Neurologic: A&O X 3 Psychiatric:  The pt has Normal affect.   Non-Invasive Vascular Imaging:   Carotid Duplex on  10/23/2019: Right:  1-39% ICA stenosis Left:  1-39% ICA stenosis Vertebrals: Bilateral vertebral arteries demonstrate antegrade flow.  Subclavians: Right subclavian artery flow was disturbed. Normal flow  hemodynamics were seen in the left subclavian artery.  Previous Carotid duplex on 04/06/2018: Right: 1-39% ICA stenosis Left:   1-39% ICA stenosis   ASSESSMENT/PLAN:: 79 y.o. male here for follow up carotid artery stenosis with hx of bilateral carotid endarterectomies in 2010 by Dr. Kellie Simmering  -pt doing well and remains asymptomatic.  His duplex today remains at 1-39% bilateral ICA stenosis.  Will have him return in 2 years with repeat duplex.   -discussed s/s of stroke with pt and his son and he understands should he develop any of these sx, he will go to the nearest ER. -pt will f/u in 2 years with carotid duplex -discussed smoking cessation and the importance but pt not interested in quitting at this time.   -continue statin/asa -pt will call sooner should they have any issues.   Leontine Locket, [facility] [phone]  Clinic MD:  Early

## 2019-11-16 DIAGNOSIS — R7301 Impaired fasting glucose: Secondary | ICD-10-CM | POA: Diagnosis not present

## 2019-11-16 DIAGNOSIS — I2510 Atherosclerotic heart disease of native coronary artery without angina pectoris: Secondary | ICD-10-CM | POA: Diagnosis not present

## 2019-11-16 DIAGNOSIS — E7849 Other hyperlipidemia: Secondary | ICD-10-CM | POA: Diagnosis not present

## 2019-11-16 DIAGNOSIS — Z720 Tobacco use: Secondary | ICD-10-CM | POA: Diagnosis not present

## 2019-12-05 IMAGING — CT CT ABD-PELV W/ CM
2 of 5 series · 16 of 46 positions shown, 18 images · IV contrast (Isovue)
Comparison: None.

CLINICAL DATA: Left abdomen pain with tarry stools for 3 weeks.

EXAM:
CT ABDOMEN AND PELVIS WITH CONTRAST
TECHNIQUE: Multidetector CT imaging of the abdomen and pelvis was performed
using the standard protocol following bolus administration of
intravenous contrast.
CONTRAST:  100mL E0CPU1-1GG IOPAMIDOL (E0CPU1-1GG) INJECTION 61%

[Series 2: axial st · axial · 0.73mm/px · z∈[-497,-87]mm · 13 of 92 slices shown, 15 images]
[im 5/92  soft-tissue]
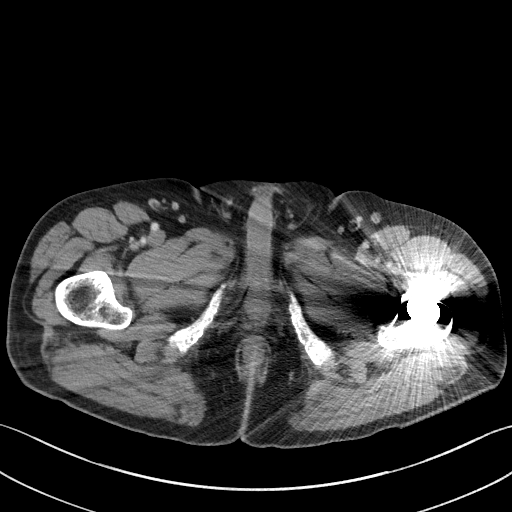
[im 5/92  bone]
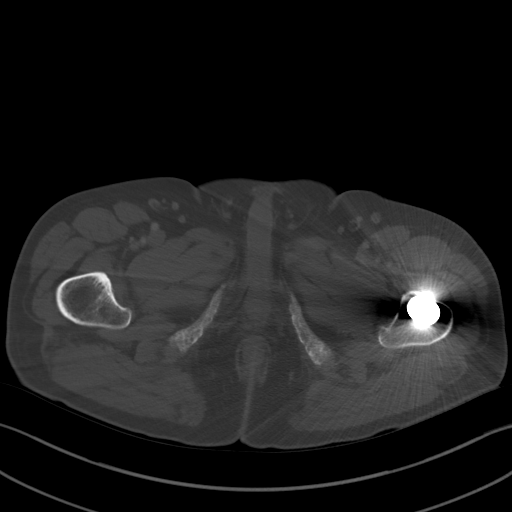
[im 15/92  soft-tissue]
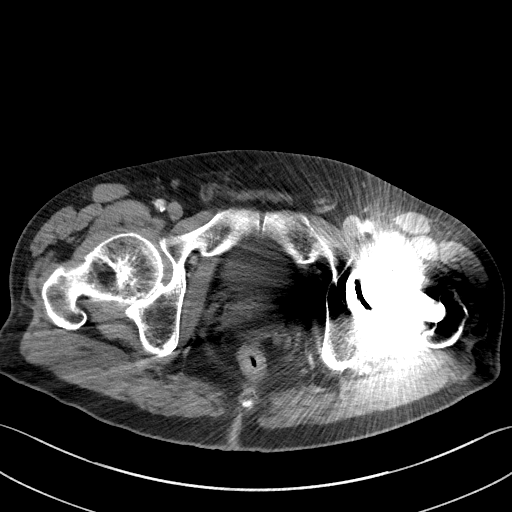
[im 20/92  soft-tissue]
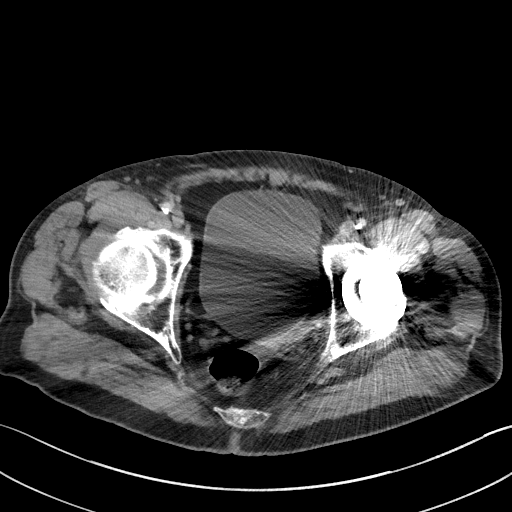
[im 24/92  soft-tissue]
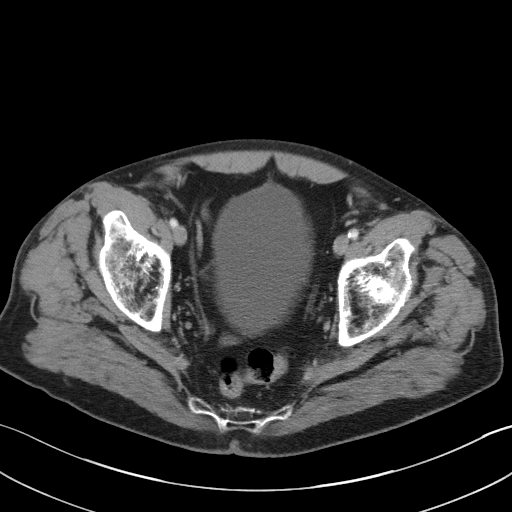
[im 34/92  soft-tissue]
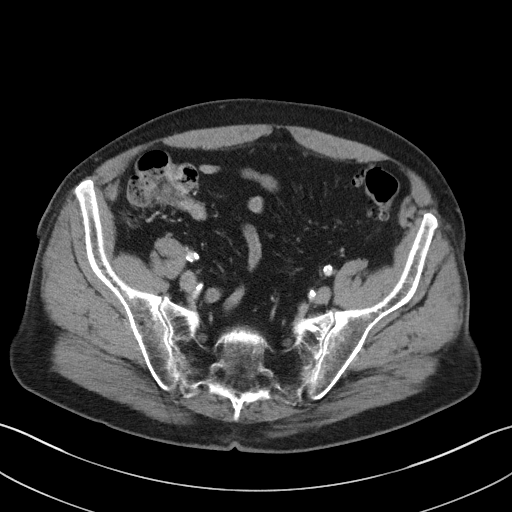
[im 39/92  soft-tissue]
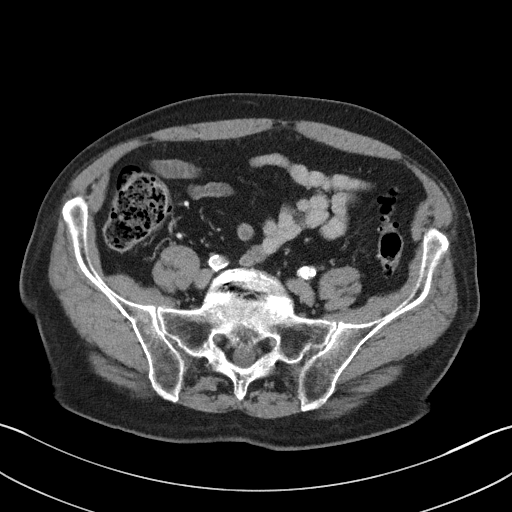
[im 48/92  soft-tissue]
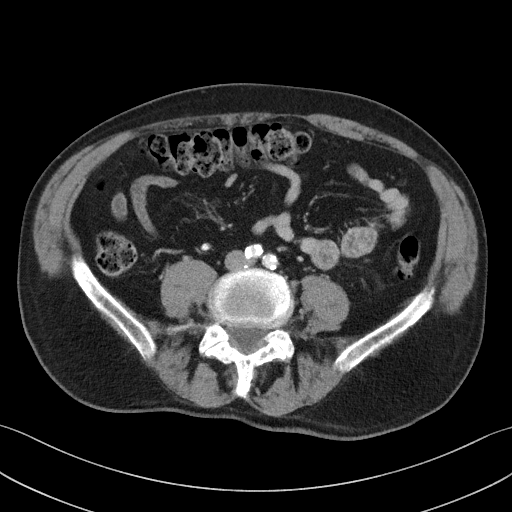
[im 53/92  soft-tissue]
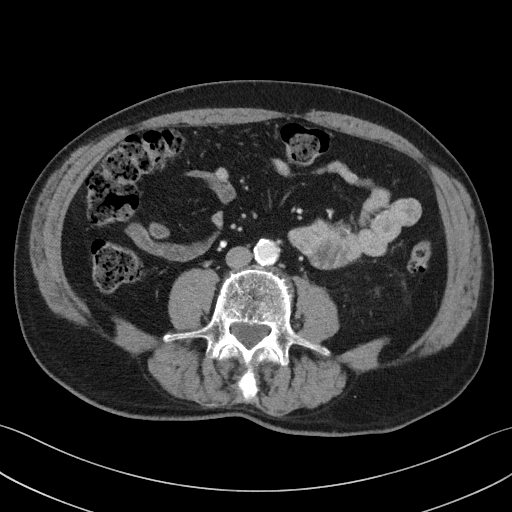
[im 58/92  soft-tissue]
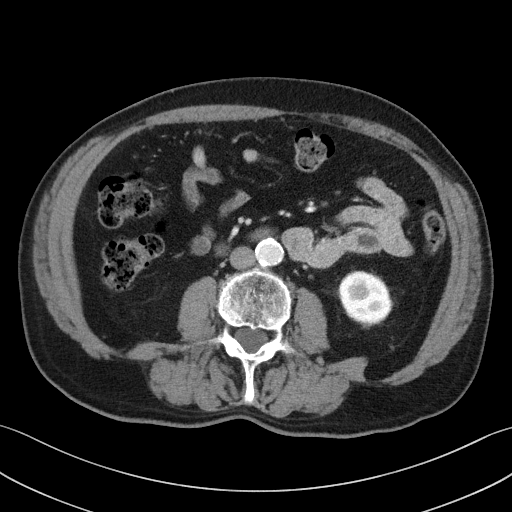
[im 58/92  bone]
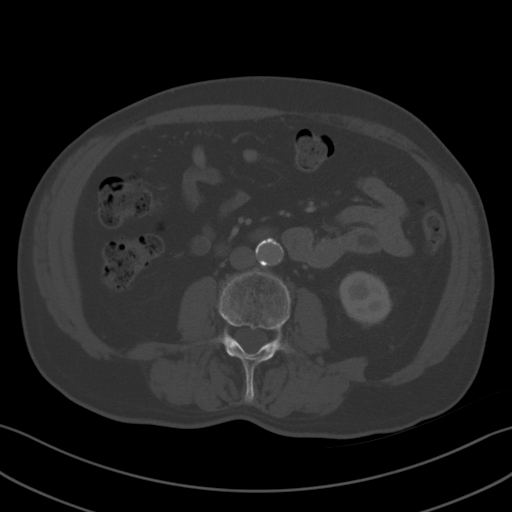
[im 68/92  soft-tissue]
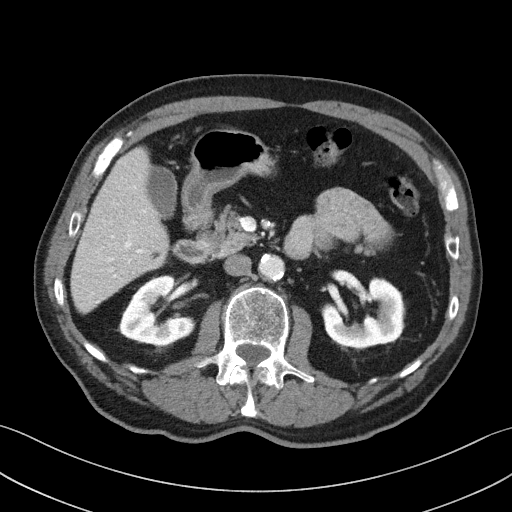
[im 72/92  soft-tissue]
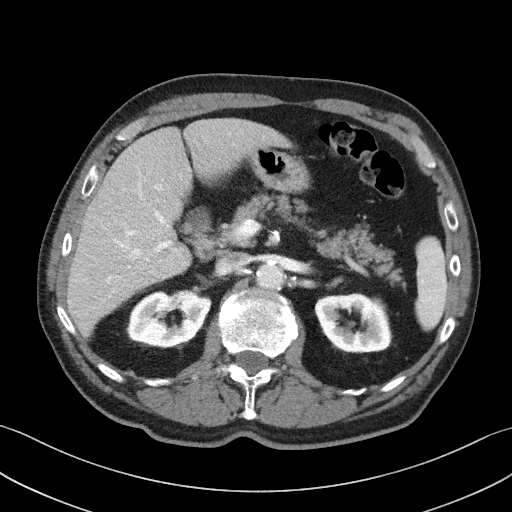
[im 77/92  soft-tissue]
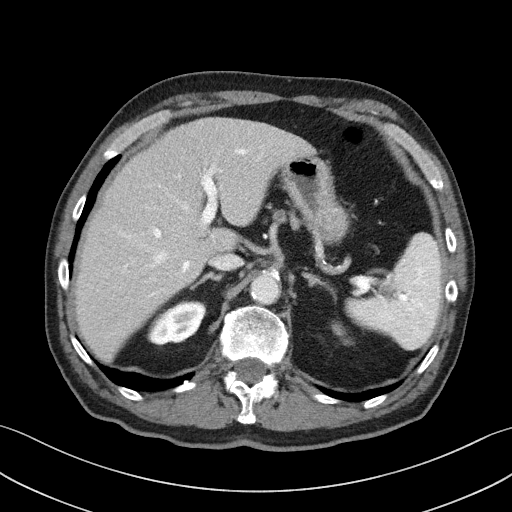
[im 87/92  soft-tissue]
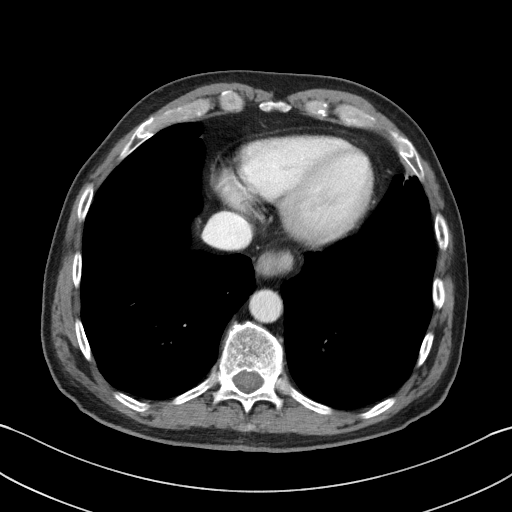

[Series 6: coronal st · coronal · 0.79mm/px · 3 of 95 slices shown]
[im 32/95  soft-tissue]
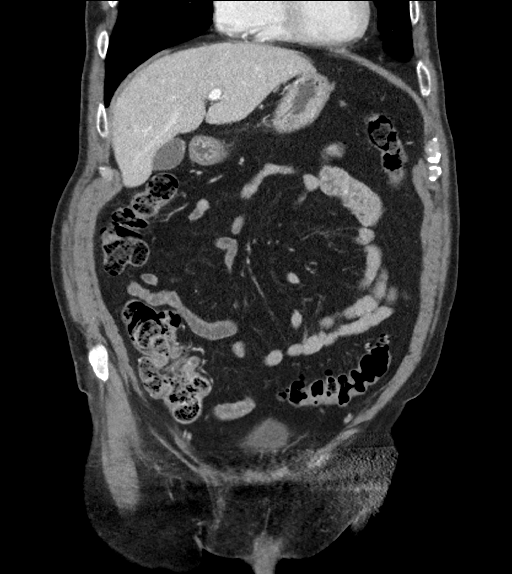
[im 42/95  soft-tissue]
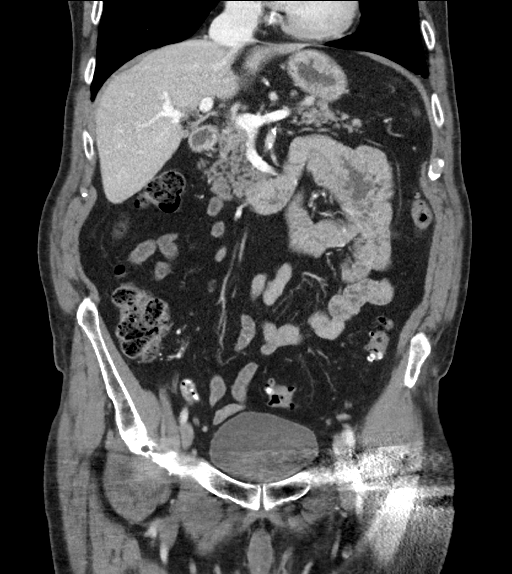
[im 53/95  soft-tissue]
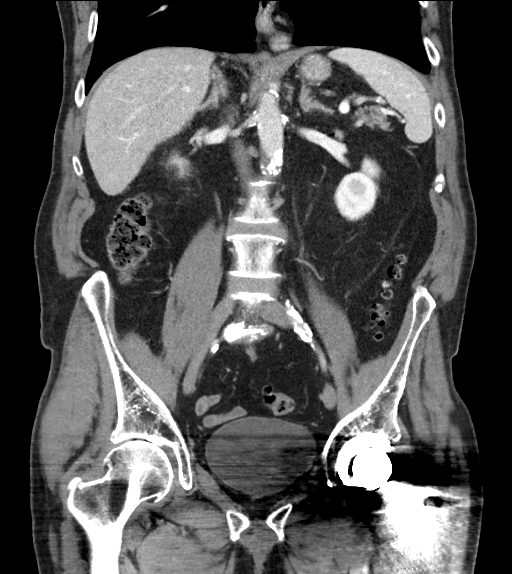

[16 of 46 positions shown; findings below may reference images not displayed]

FINDINGS: Lower chest: No acute abnormality.

Hepatobiliary: No focal liver abnormality is seen. A 1 mm calcified
granuloma is identified in the right lobe liver. No gallstones,
gallbladder wall thickening, or biliary dilatation.

Pancreas: Unremarkable. No pancreatic ductal dilatation or
surrounding inflammatory changes.

Spleen: Normal in size without focal abnormality.

Adrenals/Urinary Tract: The adrenal glands are normal. Simple cysts
are identified in the left breast. No hydronephrosis is identified
bilaterally. The bladder is normal.

Stomach/Bowel: There is a small hiatal hernia. The stomach is
otherwise normal. There is no small bowel obstruction. There is
diverticulosis of colon without diverticulitis. There is generalized
diffuse thickening of the distal rectum. The appendix is normal.

Vascular/Lymphatic: Aortic atherosclerosis. No enlarged abdominal or
pelvic lymph nodes.

Reproductive: Prostate gland is enlarged.

Other: No abdominal wall hernia or abnormality. No abdominopelvic
ascites.

Musculoskeletal: Degenerative joint changes of the spine are noted.
Vertebroplasty of T12 is noted.
IMPRESSION: Diverticulosis of colon without diverticulitis. No bowel
obstruction. Generalized diffuse thickening of the distal rectum,
nonspecific.

## 2019-12-18 DIAGNOSIS — E7849 Other hyperlipidemia: Secondary | ICD-10-CM | POA: Diagnosis not present

## 2019-12-18 DIAGNOSIS — I2510 Atherosclerotic heart disease of native coronary artery without angina pectoris: Secondary | ICD-10-CM | POA: Diagnosis not present

## 2019-12-18 DIAGNOSIS — R7301 Impaired fasting glucose: Secondary | ICD-10-CM | POA: Diagnosis not present

## 2019-12-18 DIAGNOSIS — Z720 Tobacco use: Secondary | ICD-10-CM | POA: Diagnosis not present

## 2020-01-17 DIAGNOSIS — R7301 Impaired fasting glucose: Secondary | ICD-10-CM | POA: Diagnosis not present

## 2020-01-17 DIAGNOSIS — I2510 Atherosclerotic heart disease of native coronary artery without angina pectoris: Secondary | ICD-10-CM | POA: Diagnosis not present

## 2020-01-17 DIAGNOSIS — E7849 Other hyperlipidemia: Secondary | ICD-10-CM | POA: Diagnosis not present

## 2020-01-17 DIAGNOSIS — Z720 Tobacco use: Secondary | ICD-10-CM | POA: Diagnosis not present

## 2020-02-07 DIAGNOSIS — H02831 Dermatochalasis of right upper eyelid: Secondary | ICD-10-CM | POA: Diagnosis not present

## 2020-02-14 DIAGNOSIS — E782 Mixed hyperlipidemia: Secondary | ICD-10-CM | POA: Diagnosis not present

## 2020-02-14 DIAGNOSIS — R7301 Impaired fasting glucose: Secondary | ICD-10-CM | POA: Diagnosis not present

## 2020-02-14 DIAGNOSIS — K2100 Gastro-esophageal reflux disease with esophagitis, without bleeding: Secondary | ICD-10-CM | POA: Diagnosis not present

## 2020-02-16 DIAGNOSIS — E7849 Other hyperlipidemia: Secondary | ICD-10-CM | POA: Diagnosis not present

## 2020-02-16 DIAGNOSIS — Z720 Tobacco use: Secondary | ICD-10-CM | POA: Diagnosis not present

## 2020-02-16 DIAGNOSIS — I2510 Atherosclerotic heart disease of native coronary artery without angina pectoris: Secondary | ICD-10-CM | POA: Diagnosis not present

## 2020-02-18 DIAGNOSIS — B0229 Other postherpetic nervous system involvement: Secondary | ICD-10-CM | POA: Diagnosis not present

## 2020-02-18 DIAGNOSIS — Z23 Encounter for immunization: Secondary | ICD-10-CM | POA: Diagnosis not present

## 2020-02-18 DIAGNOSIS — I2510 Atherosclerotic heart disease of native coronary artery without angina pectoris: Secondary | ICD-10-CM | POA: Diagnosis not present

## 2020-02-18 DIAGNOSIS — J449 Chronic obstructive pulmonary disease, unspecified: Secondary | ICD-10-CM | POA: Diagnosis not present

## 2020-02-18 DIAGNOSIS — Z6821 Body mass index (BMI) 21.0-21.9, adult: Secondary | ICD-10-CM | POA: Diagnosis not present

## 2020-02-18 DIAGNOSIS — I679 Cerebrovascular disease, unspecified: Secondary | ICD-10-CM | POA: Diagnosis not present

## 2020-02-18 DIAGNOSIS — E782 Mixed hyperlipidemia: Secondary | ICD-10-CM | POA: Diagnosis not present

## 2020-02-18 DIAGNOSIS — K219 Gastro-esophageal reflux disease without esophagitis: Secondary | ICD-10-CM | POA: Diagnosis not present

## 2020-02-25 ENCOUNTER — Telehealth: Payer: Medicare HMO | Admitting: Cardiology

## 2020-03-18 DIAGNOSIS — I2510 Atherosclerotic heart disease of native coronary artery without angina pectoris: Secondary | ICD-10-CM | POA: Diagnosis not present

## 2020-03-18 DIAGNOSIS — E7849 Other hyperlipidemia: Secondary | ICD-10-CM | POA: Diagnosis not present

## 2020-03-18 DIAGNOSIS — Z720 Tobacco use: Secondary | ICD-10-CM | POA: Diagnosis not present

## 2020-04-18 DIAGNOSIS — Z720 Tobacco use: Secondary | ICD-10-CM | POA: Diagnosis not present

## 2020-04-18 DIAGNOSIS — E7849 Other hyperlipidemia: Secondary | ICD-10-CM | POA: Diagnosis not present

## 2020-04-18 DIAGNOSIS — Z20828 Contact with and (suspected) exposure to other viral communicable diseases: Secondary | ICD-10-CM | POA: Diagnosis not present

## 2020-04-18 DIAGNOSIS — I2510 Atherosclerotic heart disease of native coronary artery without angina pectoris: Secondary | ICD-10-CM | POA: Diagnosis not present

## 2020-05-17 DIAGNOSIS — Z720 Tobacco use: Secondary | ICD-10-CM | POA: Diagnosis not present

## 2020-05-17 DIAGNOSIS — E7849 Other hyperlipidemia: Secondary | ICD-10-CM | POA: Diagnosis not present

## 2020-05-17 DIAGNOSIS — I2510 Atherosclerotic heart disease of native coronary artery without angina pectoris: Secondary | ICD-10-CM | POA: Diagnosis not present

## 2020-05-17 DIAGNOSIS — R7301 Impaired fasting glucose: Secondary | ICD-10-CM | POA: Diagnosis not present

## 2020-06-05 ENCOUNTER — Encounter: Payer: Self-pay | Admitting: *Deleted

## 2020-06-05 ENCOUNTER — Ambulatory Visit: Payer: Medicare HMO | Admitting: Cardiology

## 2020-06-05 ENCOUNTER — Encounter: Payer: Self-pay | Admitting: Cardiology

## 2020-06-05 VITALS — BP 140/70 | HR 98 | Ht 75.0 in | Wt 183.6 lb

## 2020-06-05 DIAGNOSIS — I5022 Chronic systolic (congestive) heart failure: Secondary | ICD-10-CM | POA: Diagnosis not present

## 2020-06-05 DIAGNOSIS — E782 Mixed hyperlipidemia: Secondary | ICD-10-CM | POA: Diagnosis not present

## 2020-06-05 DIAGNOSIS — I2510 Atherosclerotic heart disease of native coronary artery without angina pectoris: Secondary | ICD-10-CM | POA: Diagnosis not present

## 2020-06-05 MED ORDER — BISOPROLOL FUMARATE 5 MG PO TABS: 2.5000 mg | ORAL_TABLET | Freq: Every day | ORAL | 1 refills | Status: DC

## 2020-06-05 NOTE — Patient Instructions (Signed)
Your physician recommends that you schedule a follow-up appointment in: 2 [place] has recommended you make the following change in your medication:   START BISOPROLOL 2.5 MG (1/2 TABLET) DAILY   Thank you for choosing [facility]!!

## 2020-06-05 NOTE — Progress Notes (Signed)
Clinical Summary Joseph Forbes is a 80 y.o.male seen today for follow up of the following medical problems.  1. CAD: prior stents in 2000 and 2001. cath 2008 w/ nonobstructive disease. Normal LVEF 65% by echo 2012. - nuclear stress 03/2015 which showed no specific ischemia.  09/2017 dobutmaine nuclear stress inferoseptal scar, small apical infarct with mild peri-infarct ischemia. Low risk study.  - some recent chest pain - episode Sunday watching tv. Sharp pain across chest, 8-9/10. No other associated symptoms. Not positional. Pain lasted about 2 minutes, took NG. 2 episodes over the 6 weeks. - can have some SOB/DOE which is stable, better with inhaler.  - looks like imdur stopped after ER visit Jan 2019 with GI bleed .   During 07/2019 visit with PA Leonides Sake noted to have frequent PVCs by EKG, referred for stress testing. K was 3.9, Mg 1.9 07/2019 dobutamine nuclear stress: inferior scar, no current ischemia. LVEF 37% by nuclear   - no recent chest pain   2. Chronic systolic HF - 10/6193 echo LVEF 40-45% - chronic SOB related to his COPD, some cough. No recent edema.    3. Mitral regurgitation - moderate by 09/2019 echo  4. Carotid stenosis  - prior bilateral carotid endarterectomies, recent US shows good patency. Followedby vascular    5. HL - compliant with statin - labs followed by pcp   6. 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    Has had vaccine x 2.   Past Medical History:  Diagnosis Date  . CAD (coronary artery disease)    Normal LV function .Status post stent placement to the right coronary artery in 2000 and 2001Obstructive coronary artery disease in November 2008 by catheterization   . Carotid artery disease ([place])   . COPD (chronic obstructive pulmonary disease) ([place])   . GERD (gastroesophageal reflux disease)   . Hyperlipidemia   . Hypertension    pt denies, no meds     Allergies  Allergen Reactions  . Clarithromycin Nausea Only   . Other Itching, Rash and Other (See Comments)    Some medication OTC he took for headache caused a rash     Current Outpatient Medications  Medication Sig Dispense Refill  . aspirin 81 MG tablet Take 1 tablet (81 mg total) by mouth daily. 30 tablet   . atorvastatin (LIPITOR) 80 MG tablet TAKE ONE TABLET BY MOUTH DAILY 15 tablet 0  . ezetimibe (ZETIA) 10 MG tablet Take 10 mg by mouth daily.    . Fluticasone-Umeclidin-Vilant (TRELEGY ELLIPTA IN) Inhale into the lungs.    . gabapentin (NEURONTIN) 300 MG capsule     . ipratropium (ATROVENT) 0.02 % nebulizer solution Inhale 3 mLs into the lungs every 6 (six) hours as needed.    . nitroGLYCERIN (NITROSTAT) 0.4 MG SL tablet DISSOLVE 1 TABLET BY MOUTH UNDER TONGUE EVERY 5 MINUTES AS NEEDED FOR CHEST PAIN. DO NOT EXCEED A TOTAL OF 3 DOSES IN 15 MIN 25 tablet 0  . ondansetron (ZOFRAN-ODT) 8 MG disintegrating tablet     . pantoprazole (PROTONIX) 40 MG tablet TAKE 1 TABLET BY MOUTH DAILY BEFORE BREAKFAST 30 tablet 5  . predniSONE (DELTASONE) 20 MG tablet     . PROAIR HFA 108 (90 Base) MCG/ACT inhaler Inhale 2 puffs into the lungs every 6 (six) hours as needed for wheezing or shortness of breath.     . valACYclovir (VALTREX) 1000 MG tablet     . WIXELA INHUB 250-50 MCG/DOSE AEPB Inhale 1  puff into the lungs 2 (two) times a day.     No current facility-administered medications for this visit.     Past Surgical History:  Procedure Laterality Date  . CARDIAC CATHETERIZATION  02/2007  . cardiac stents     2 stents placed   . CAROTID ENDARTERECTOMY Bilateral 2010      (here [facility])  CE  . COLONOSCOPY N/A 05/23/2017   Procedure: COLONOSCOPY;  Surgeon: Rogene Houston, MD;  [facility];  Service: Endoscopy;  Laterality: N/A;  . ESOPHAGOGASTRODUODENOSCOPY N/A 05/23/2017   Procedure: ESOPHAGOGASTRODUODENOSCOPY (EGD);  Surgeon: Rogene Houston, MD;  [facility];  Service: Endoscopy;  Laterality: N/A;  . FRACTURE SURGERY      right arm  . HYDROCELE EXCISION Right 04/24/2013   Procedure: HYDROCELECTOMY ADULT;  Surgeon: Hanley Ben, MD;  Location: [facility];  Service: Urology;  Laterality: Right;  . JOINT REPLACEMENT Left Nov. 3, 2015   Hip  . TOTAL HIP ARTHROPLASTY Left 02/19/2014   Procedure: TOTAL HIP ARTHROPLASTY;  Surgeon: Garald Balding, MD;  Location: [place];  Service: Orthopedics;  Laterality: Left;     Allergies  Allergen Reactions  . Clarithromycin Nausea Only  . Other Itching, Rash and Other (See Comments)    Some medication OTC he took for headache caused a rash      Family History  Problem Relation Age of Onset  . Heart disease Mother        Before age 27  . Heart disease Father   . Heart attack Father   . Colon cancer Neg Hx      Social History Joseph Forbes reports that he has been smoking cigarettes. He started smoking about 67 years ago. He has a 55.00 pack-year smoking history. His smokeless tobacco use includes chew. Joseph Forbes reports no history of alcohol use.   Review of Systems CONSTITUTIONAL: No weight loss, fever, chills, weakness or fatigue.  HEENT: Eyes: No visual loss, blurred vision, double vision or yellow sclerae.No hearing loss, sneezing, congestion, runny nose or sore throat.  SKIN: No rash or itching.  CARDIOVASCULAR: per hpi RESPIRATORY: No shortness of breath, cough or sputum.  GASTROINTESTINAL: No anorexia, nausea, vomiting or diarrhea. No abdominal pain or blood.  GENITOURINARY: No burning on urination, no polyuria NEUROLOGICAL: No headache, dizziness, syncope, paralysis, ataxia, numbness or tingling in the extremities. No change in bowel or bladder control.  MUSCULOSKELETAL: No muscle, back pain, joint pain or stiffness.  LYMPHATICS: No enlarged nodes. No history of splenectomy.  PSYCHIATRIC: No history of depression or anxiety.  ENDOCRINOLOGIC: No reports of sweating, cold or heat intolerance. No polyuria or polydipsia.  Marland Kitchen   Physical  Examination Today's Vitals   06/05/20 0854  BP: 140/70  Pulse: 98  SpO2: 98%  Weight: 183 lb 9.6 oz (83.3 kg)  Height: 6\' 3"  (1.905 m)   Body mass index is 22.95 kg/m.  Gen: resting comfortably, no acute distress HEENT: no scleral icterus, pupils equal round and reactive, no palptable cervical adenopathy,  CV: RRR, no m/r/g, no jvd Resp: bilaterla wheezing GI: abdomen is soft, non-tender, non-distended, normal bowel sounds, no hepatosplenomegaly MSK: extremities are warm, no edema.  Skin: warm, no rash Neuro:  no focal deficits Psych: appropriate affect   Diagnostic Studies  12/12 DSE: Baseline LVEF 55-60%. Negative for ischemia.   07/2012 Carotids: patent bilaterally w/ minimal plaque/hyperplasia, vertebals normal bilateral   01/02/13 EKG: SR, normal axis, no ischemic changes  03/2015 Dobutamine  nuclear stress  Defect 1: There is a small defect of moderate severity present in the basal inferoseptal, mid inferoseptal, apical septal and apical inferior location. This is most likely due to soft tissue attenuation artifact. However, myocardial scar cannot entirely be ruled out.  This is a low risk study. No ischemic territories.  Nuclear stress EF: 58%.   09/2017 Dobutamine nuclear stress  There was no ST segment deviation noted during stress.  Findings consistent with prior inferoseptal infarct without current ischemia. Small apical infarct with mild peri-infarct ischemia.  This is a low risk study.  The left ventricular ejection fraction is normal (55-65%).   07/2019 nuclear stress  Horizontal ST segment depression ST segment depression of 0.5 mm was noted during stress in the V4 and V5 leads.  Inversions in leads2, 3, and aVF seen at baseline and slightly more pronounced with dobutamine.  Defect 1: There is a large defect of severe severity present in the basal inferoseptal, basal inferior, mid inferoseptal, mid inferior and apical inferior  location.  Findings consistent with prior myocardial infarction. No significant ischemic territories.  This is a high risk study.  Nuclear stress EF: 37%.  09/2019 echo 1. Left ventricular ejection fraction, by estimation, is 40 to 45%. The  left ventricle has moderately decreased function. The left ventricle  demonstrates regional wall motion abnormalities (see scoring  diagram/findings for description). Left ventricular  diastolic parameters are consistent with Grade I diastolic dysfunction  (impaired relaxation).  2. Right ventricular systolic function is normal. The right ventricular  size is normal. There is normal pulmonary artery systolic pressure. The  estimated right ventricular systolic pressure is 90.2 mmHg.  3. Left atrial size was moderately dilated.  4. Right atrial size was mildly dilated.  5. The mitral valve is grossly normal, but mildly thickened with  restriction of posterior leaflet motion. Moderate mitral valve  regurgitation.  6. The aortic valve is tricuspid. Aortic valve regurgitation is trivial.  7. The inferior vena cava is normal in size with greater than 50%  respiratory variability, suggesting right atrial pressure of 3 mmHg.   Comparison(s): Nuclear stress test done 07/20/19 shoqed an EF of 37%.  Assessment and Plan  1. CAD -07/2019 stress test without ischemia, - no symptoms, continue current meds  2. Chronic systolic HF - appears euvolemic - start bisoprolol 2.5mg  daily in setting of systolic dysfunction and COPD - next visit likely start low dose ARB vs entresto.    3. Hyperlipidemia - request pcp labs, continue statin      Arnoldo Lenis, M.D.

## 2020-06-10 ENCOUNTER — Telehealth: Payer: Self-pay | Admitting: Cardiology

## 2020-06-10 DIAGNOSIS — K219 Gastro-esophageal reflux disease without esophagitis: Secondary | ICD-10-CM | POA: Diagnosis not present

## 2020-06-10 DIAGNOSIS — E782 Mixed hyperlipidemia: Secondary | ICD-10-CM | POA: Diagnosis not present

## 2020-06-10 DIAGNOSIS — J449 Chronic obstructive pulmonary disease, unspecified: Secondary | ICD-10-CM | POA: Diagnosis not present

## 2020-06-10 DIAGNOSIS — K2100 Gastro-esophageal reflux disease with esophagitis, without bleeding: Secondary | ICD-10-CM | POA: Diagnosis not present

## 2020-06-10 DIAGNOSIS — I679 Cerebrovascular disease, unspecified: Secondary | ICD-10-CM | POA: Diagnosis not present

## 2020-06-10 DIAGNOSIS — B0229 Other postherpetic nervous system involvement: Secondary | ICD-10-CM | POA: Diagnosis not present

## 2020-06-10 DIAGNOSIS — Z6822 Body mass index (BMI) 22.0-22.9, adult: Secondary | ICD-10-CM | POA: Diagnosis not present

## 2020-06-10 DIAGNOSIS — I2510 Atherosclerotic heart disease of native coronary artery without angina pectoris: Secondary | ICD-10-CM | POA: Diagnosis not present

## 2020-06-10 DIAGNOSIS — R7301 Impaired fasting glucose: Secondary | ICD-10-CM | POA: Diagnosis not present

## 2020-06-10 DIAGNOSIS — E7849 Other hyperlipidemia: Secondary | ICD-10-CM | POA: Diagnosis not present

## 2020-06-10 NOTE — Telephone Encounter (Signed)
Spoke to patient's wife.She was calling to let Dr.Branch know husband cannot take Bisoprolol 5 mg 1/2 tablet daily causing dizziness.Stated he saw Dr.Sasser this morning and B/P was low.She cannot remember exact reading.Advised I will send message to Dr.Branch for advice.

## 2020-06-10 NOTE — Telephone Encounter (Signed)
Pt wife voiced understanding and will have pt take bisoprolol at night - will let us know if he can still not tolerate

## 2020-06-10 NOTE — Telephone Encounter (Signed)
Could he try taking at night time to see if better tolerated, if not then yes will have to stop  Zandra Abts MD

## 2020-06-10 NOTE — Telephone Encounter (Signed)
Pt's wife called stating the new medication bisoprolol (ZEBETA) 5 MG tablet [[id number]]  Is making him very dizzy

## 2020-06-16 DIAGNOSIS — I2510 Atherosclerotic heart disease of native coronary artery without angina pectoris: Secondary | ICD-10-CM | POA: Diagnosis not present

## 2020-06-16 DIAGNOSIS — R7301 Impaired fasting glucose: Secondary | ICD-10-CM | POA: Diagnosis not present

## 2020-06-16 DIAGNOSIS — Z720 Tobacco use: Secondary | ICD-10-CM | POA: Diagnosis not present

## 2020-06-16 DIAGNOSIS — E7849 Other hyperlipidemia: Secondary | ICD-10-CM | POA: Diagnosis not present

## 2020-06-23 ENCOUNTER — Telehealth: Payer: Self-pay | Admitting: Cardiology

## 2020-06-23 DIAGNOSIS — I5022 Chronic systolic (congestive) heart failure: Secondary | ICD-10-CM

## 2020-06-23 MED ORDER — LOSARTAN POTASSIUM 25 MG PO TABS
25.0000 mg | ORAL_TABLET | Freq: Every day | ORAL | 1 refills | Status: AC
Start: 2020-06-23 — End: 2021-02-05

## 2020-06-23 NOTE — Telephone Encounter (Signed)
Pt and wife voiced understanding - Medication sent to pharmacy and pt will have labs done at [facility] in 2 weeks

## 2020-06-23 NOTE — Addendum Note (Signed)
Addended by: Julian Hy T on: 06/23/2020 02:50 PM   Modules accepted: Orders

## 2020-06-23 NOTE — Telephone Encounter (Signed)
Pt c/o medication issue:  1. Name of Medication: bisoprolol (ZEBETA) 5 MG tablet  2. How are you currently taking this medication (dosage and times per day)? He was originally taking in the morning but caused dizziness, He was instructed to take at night but it is preventing him from sleeping.  3. Are you having a reaction (difficulty breathing--STAT)? NA  4. What is your medication issue? He was originally taking in the morning but caused dizziness, He was instructed to take at night but it is preventing him from sleeping.  Please call to let him know what he should do.

## 2020-06-23 NOTE — Telephone Encounter (Signed)
Pt says since trying to take bisoprolol at night he has been unable to sleep and still unable to tolerate - pt aware per previous phone notes to stop bisoprolol and would forward to provider

## 2020-06-23 NOTE — Telephone Encounter (Signed)
Can stop bisoprolol. We need to try some other meds to try and strengthen his heart. I would start losartan 25mg  daily and check bmet in 2 weeks.   Carlyle Dolly MD

## 2020-07-16 DIAGNOSIS — Z720 Tobacco use: Secondary | ICD-10-CM | POA: Diagnosis not present

## 2020-07-16 DIAGNOSIS — E7849 Other hyperlipidemia: Secondary | ICD-10-CM | POA: Diagnosis not present

## 2020-07-16 DIAGNOSIS — I2510 Atherosclerotic heart disease of native coronary artery without angina pectoris: Secondary | ICD-10-CM | POA: Diagnosis not present

## 2020-07-16 DIAGNOSIS — R7301 Impaired fasting glucose: Secondary | ICD-10-CM | POA: Diagnosis not present

## 2020-08-12 DIAGNOSIS — J441 Chronic obstructive pulmonary disease with (acute) exacerbation: Secondary | ICD-10-CM | POA: Diagnosis not present

## 2020-08-12 DIAGNOSIS — Z20828 Contact with and (suspected) exposure to other viral communicable diseases: Secondary | ICD-10-CM | POA: Diagnosis not present

## 2020-08-14 ENCOUNTER — Ambulatory Visit: Payer: Medicare HMO | Admitting: Cardiology

## 2020-08-14 NOTE — Progress Notes (Deleted)
Clinical Summary Joseph Forbes is a 80 y.o.male seen today for follow up of the following medical problems.  1. CAD: prior stents in 2000 and 2001. cath 2008 w/ nonobstructive disease. Normal LVEF 65% by echo 2012. - nuclear stress 03/2015 which showed no specific ischemia.  09/2017 dobutmaine nuclear stress inferoseptal scar, small apical infarct with mild peri-infarct ischemia. Low risk study.  - some recent chest pain - episode Sunday watching tv. Sharp pain across chest, 8-9/10. No other associated symptoms. Not positional. Pain lasted about 2 minutes, took NG. 2 episodes over the 6 weeks. - can have some SOB/DOE which is stable, better with inhaler.  - looks like imdur stopped after ER visit Jan 2019 with GI bleed .   During 07/2019 visit with PA Leonides Sake noted to have frequent PVCs by EKG, referred for stress testing. K was 3.9, Mg 1.9 07/2019 dobutamine nuclear stress: inferior scar, no current ischemia. LVEF 37% by nuclear   - no recent chest pain   2. Chronic systolic HF - 0/1601 echo LVEF 40-45% - chronic SOB related to his COPD, some cough. No recent edema.   - last visit we started bisoprolol 2.5mg  daily - causes dizziness, reported insomnia. We stopped bisoprolol, started losartan 25mg  daily   3. Mitral regurgitation - moderate by 09/2019 echo  4. Carotid stenosis  - prior bilateral carotid endarterectomies, recent US shows good patency. Followedby vascular    5. HL - compliant with statin - labs followed by pcp   6. 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 CAD (coronary artery disease)    Normal LV function .Status post stent placement to the right coronary artery in 2000 and 2001Obstructive coronary artery disease in November 2008 by catheterization   . Carotid artery disease ([place])   . COPD (chronic obstructive pulmonary disease) ([place])   . GERD (gastroesophageal reflux disease)   .  Hyperlipidemia   . Hypertension    pt denies, no meds     Allergies  Allergen Reactions  . Clarithromycin Nausea Only  . Other Itching, Rash and Other (See Comments)    Some medication OTC he took for headache caused a rash     Current Outpatient Medications  Medication Sig Dispense Refill  . aspirin 81 MG tablet Take 1 tablet (81 mg total) by mouth daily. 30 tablet   . atorvastatin (LIPITOR) 80 MG tablet TAKE ONE TABLET BY MOUTH DAILY 15 tablet 0  . ezetimibe (ZETIA) 10 MG tablet Take 10 mg by mouth daily.    . Fluticasone-Umeclidin-Vilant (TRELEGY ELLIPTA IN) Inhale into the lungs.    . gabapentin (NEURONTIN) 300 MG capsule Take 300 mg by mouth daily at 6 (six) AM.    . ipratropium (ATROVENT) 0.02 % nebulizer solution Inhale 3 mLs into the lungs every 6 (six) hours as needed.    Marland Kitchen losartan (COZAAR) 25 MG tablet Take 1 tablet (25 mg total) by mouth daily. 90 tablet 1  . nitroGLYCERIN (NITROSTAT) 0.4 MG SL tablet DISSOLVE 1 TABLET BY MOUTH UNDER TONGUE EVERY 5 MINUTES AS NEEDED FOR CHEST PAIN. DO NOT EXCEED A TOTAL OF 3 DOSES IN 15 MIN 25 tablet 0  . ondansetron (ZOFRAN-ODT) 8 MG disintegrating tablet Take 8 mg by mouth every 8 (eight) hours as needed.    . pantoprazole (PROTONIX) 40 MG tablet TAKE 1 TABLET BY MOUTH DAILY BEFORE BREAKFAST 30 tablet 5  . predniSONE (DELTASONE) 20 MG tablet  Take 20 mg by mouth daily with breakfast.    . PROAIR HFA 108 (90 Base) MCG/ACT inhaler Inhale 2 puffs into the lungs every 6 (six) hours as needed for wheezing or shortness of breath.     . valACYclovir (VALTREX) 1000 MG tablet Take 1,000 mg by mouth daily.    Grant Ruts INHUB 250-50 MCG/DOSE AEPB Inhale 1 puff into the lungs 2 (two) times a day.     No current facility-administered medications for this visit.     Past Surgical History:  Procedure Laterality Date  . CARDIAC CATHETERIZATION  02/2007  . cardiac stents     2 stents placed   . CAROTID ENDARTERECTOMY Bilateral 2010      (here at  [facility])  CE  . COLONOSCOPY N/A 05/23/2017   Procedure: COLONOSCOPY;  Surgeon: Rogene Houston, MD;  [facility];  Service: Endoscopy;  Laterality: N/A;  . ESOPHAGOGASTRODUODENOSCOPY N/A 05/23/2017   Procedure: ESOPHAGOGASTRODUODENOSCOPY (EGD);  Surgeon: Rogene Houston, MD;  [facility];  Service: Endoscopy;  Laterality: N/A;  . FRACTURE SURGERY     right arm  . HYDROCELE EXCISION Right 04/24/2013   Procedure: HYDROCELECTOMY ADULT;  Surgeon: Hanley Ben, MD;  Location: [facility];  Service: Urology;  Laterality: Right;  . JOINT REPLACEMENT Left Nov. 3, 2015   Hip  . TOTAL HIP ARTHROPLASTY Left 02/19/2014   Procedure: TOTAL HIP ARTHROPLASTY;  Surgeon: Garald Balding, MD;  Location: [place];  Service: Orthopedics;  Laterality: Left;     Allergies  Allergen Reactions  . Clarithromycin Nausea Only  . Other Itching, Rash and Other (See Comments)    Some medication OTC he took for headache caused a rash      Family History  Problem Relation Age of Onset  . Heart disease Mother        Before age 71  . Heart disease Father   . Heart attack Father   . Colon cancer Neg Hx      Social History Joseph Forbes reports that he has been smoking cigarettes. He started smoking about 67 years ago. He has a 55.00 pack-year smoking history. His smokeless tobacco use includes chew. Joseph Forbes reports no history of alcohol use.   Review of Systems CONSTITUTIONAL: No weight loss, fever, chills, weakness or fatigue.  HEENT: Eyes: No visual loss, blurred vision, double vision or yellow sclerae.No hearing loss, sneezing, congestion, runny nose or sore throat.  SKIN: No rash or itching.  CARDIOVASCULAR:  RESPIRATORY: No shortness of breath, cough or sputum.  GASTROINTESTINAL: No anorexia, nausea, vomiting or diarrhea. No abdominal pain or blood.  GENITOURINARY: No burning on urination, no polyuria NEUROLOGICAL: No headache, dizziness, syncope, paralysis, ataxia,  numbness or tingling in the extremities. No change in bowel or bladder control.  MUSCULOSKELETAL: No muscle, back pain, joint pain or stiffness.  LYMPHATICS: No enlarged nodes. No history of splenectomy.  PSYCHIATRIC: No history of depression or anxiety.  ENDOCRINOLOGIC: No reports of sweating, cold or heat intolerance. No polyuria or polydipsia.  Marland Kitchen   Physical Examination There were no vitals filed for this visit. There were no vitals filed for this visit.  Gen: resting comfortably, no acute distress HEENT: no scleral icterus, pupils equal round and reactive, no palptable cervical adenopathy,  CV Resp: Clear to auscultation bilaterally GI: abdomen is soft, non-tender, non-distended, normal bowel sounds, no hepatosplenomegaly MSK: extremities are warm, no edema.  Skin: warm, no rash Neuro:  no focal deficits Psych: appropriate  affect   Diagnostic Studies 12/12 DSE: Baseline LVEF 55-60%. Negative for ischemia.   07/2012 Carotids: patent bilaterally w/ minimal plaque/hyperplasia, vertebals normal bilateral   01/02/13 EKG: SR, normal axis, no ischemic changes  03/2015 Dobutamine nuclear stress  Defect 1: There is a small defect of moderate severity present in the basal inferoseptal, mid inferoseptal, apical septal and apical inferior location. This is most likely due to soft tissue attenuation artifact. However, myocardial scar cannot entirely be ruled out.  This is a low risk study. No ischemic territories.  Nuclear stress EF: 58%.   09/2017 Dobutamine nuclear stress  There was no ST segment deviation noted during stress.  Findings consistent with prior inferoseptal infarct without current ischemia. Small apical infarct with mild peri-infarct ischemia.  This is a low risk study.  The left ventricular ejection fraction is normal (55-65%).   07/2019 nuclear stress  Horizontal ST segment depression ST segment depression of 0.5 mm was noted during stress in the V4  and V5 leads.  Inversions in leads2, 3, and aVF seen at baseline and slightly more pronounced with dobutamine.  Defect 1: There is a large defect of severe severity present in the basal inferoseptal, basal inferior, mid inferoseptal, mid inferior and apical inferior location.  Findings consistent with prior myocardial infarction. No significant ischemic territories.  This is a high risk study.  Nuclear stress EF: 37%.  09/2019 echo 1. Left ventricular ejection fraction, by estimation, is 40 to 45%. The  left ventricle has moderately decreased function. The left ventricle  demonstrates regional wall motion abnormalities (see scoring  diagram/findings for description). Left ventricular  diastolic parameters are consistent with Grade I diastolic dysfunction  (impaired relaxation).  2. Right ventricular systolic function is normal. The right ventricular  size is normal. There is normal pulmonary artery systolic pressure. The  estimated right ventricular systolic pressure is 82.9 mmHg.  3. Left atrial size was moderately dilated.  4. Right atrial size was mildly dilated.  5. The mitral valve is grossly normal, but mildly thickened with  restriction of posterior leaflet motion. Moderate mitral valve  regurgitation.  6. The aortic valve is tricuspid. Aortic valve regurgitation is trivial.  7. The inferior vena cava is normal in size with greater than 50%  respiratory variability, suggesting right atrial pressure of 3 mmHg.   Comparison(s): Nuclear stress test done 07/20/19 shoqed an EF of 37%.    Assessment and Plan  1. CAD -07/2019 stress test without ischemia, - no symptoms, continue current meds  2. Chronic systolic HF - appears euvolemic - start bisoprolol 2.5mg  daily in setting of systolic dysfunction and COPD - next visit likely start low dose ARB vs entresto.    3. Hyperlipidemia - request pcp labs, continue statin      Arnoldo Lenis, M.D., F.A.C.C.

## 2020-08-16 DIAGNOSIS — Z720 Tobacco use: Secondary | ICD-10-CM | POA: Diagnosis not present

## 2020-08-16 DIAGNOSIS — E7849 Other hyperlipidemia: Secondary | ICD-10-CM | POA: Diagnosis not present

## 2020-08-16 DIAGNOSIS — I2510 Atherosclerotic heart disease of native coronary artery without angina pectoris: Secondary | ICD-10-CM | POA: Diagnosis not present

## 2020-08-16 DIAGNOSIS — R7301 Impaired fasting glucose: Secondary | ICD-10-CM | POA: Diagnosis not present

## 2020-09-15 DIAGNOSIS — R7301 Impaired fasting glucose: Secondary | ICD-10-CM | POA: Diagnosis not present

## 2020-09-15 DIAGNOSIS — Z720 Tobacco use: Secondary | ICD-10-CM | POA: Diagnosis not present

## 2020-09-15 DIAGNOSIS — I2510 Atherosclerotic heart disease of native coronary artery without angina pectoris: Secondary | ICD-10-CM | POA: Diagnosis not present

## 2020-09-15 DIAGNOSIS — E7849 Other hyperlipidemia: Secondary | ICD-10-CM | POA: Diagnosis not present

## 2020-09-30 ENCOUNTER — Other Ambulatory Visit: Payer: Self-pay

## 2020-09-30 ENCOUNTER — Other Ambulatory Visit (HOSPITAL_COMMUNITY)
Admission: RE | Admit: 2020-09-30 | Discharge: 2020-09-30 | Disposition: A | Discharge status: Home / Self Care | Payer: Medicare HMO | Source: Ambulatory Visit | Attending: Cardiology | Admitting: Cardiology

## 2020-09-30 DIAGNOSIS — I5022 Chronic systolic (congestive) heart failure: Secondary | ICD-10-CM | POA: Insufficient documentation

## 2020-09-30 LAB — BASIC METABOLIC PANEL
Anion gap: 6 (ref 5–15)
BUN: 16 mg/dL (ref 8–23)
CO2: 28 mmol/L (ref 22–32)
Calcium: 9.2 mg/dL (ref 8.9–10.3)
Chloride: 104 mmol/L (ref 98–111)
Creatinine, Ser: 1.1 mg/dL (ref 0.61–1.24)
GFR, Estimated: >60 mL/min (ref >60)
Glucose, Bld: 121 mg/dL — ABNORMAL HIGH (ref 70–99)
Potassium: 3.9 mmol/L (ref 3.5–5.1)
Sodium: 138 mmol/L (ref 135–145)

## 2020-10-15 ENCOUNTER — Encounter: Payer: Self-pay | Admitting: *Deleted

## 2020-10-15 ENCOUNTER — Ambulatory Visit: Payer: Medicare HMO | Admitting: Cardiology

## 2020-10-15 ENCOUNTER — Encounter: Payer: Self-pay | Admitting: Cardiology

## 2020-10-15 VITALS — BP 124/70 | HR 81 | Ht 75.5 in | Wt 182.0 lb

## 2020-10-15 DIAGNOSIS — I2510 Atherosclerotic heart disease of native coronary artery without angina pectoris: Secondary | ICD-10-CM

## 2020-10-15 DIAGNOSIS — I5022 Chronic systolic (congestive) heart failure: Secondary | ICD-10-CM | POA: Diagnosis not present

## 2020-10-15 NOTE — Patient Instructions (Addendum)
Medication Instructions:  Your physician recommends that you continue on your current medications as directed. Please refer to the Current Medication list given to you today.  Labwork: none  Testing/Procedures: Your physician has requested that you have an echocardiogram. Echocardiography is a painless test that uses sound waves to create images of your heart. It provides your doctor with information about the size and shape of your heart and how well your heart's chambers and valves are working. This procedure takes approximately one hour. There are no restrictions for this procedure.  Follow-Up: Your physician recommends that you schedule a follow-up appointment in: 4 months  Any Other Special Instructions Will Be Listed Below (If Applicable).  If you need a refill on your cardiac medications before your next appointment, please call your pharmacy. 

## 2020-10-15 NOTE — Progress Notes (Addendum)
Clinical Summary [name] is a 80 y.o.male seen today for follow up of the following medical problems.   1. CAD: prior stents in 2000 and 2001. cath 2008 w/ nonobstructive disease. Normal LVEF 65% by echo 2012. - nuclear stress 03/2015 which showed no specific ischemia.   09/2017 dobutmaine nuclear stress inferoseptal scar, small apical infarct with mild peri-infarct ischemia. Low risk study.      During 07/2019 visit with PA Leonides Sake noted to have frequent PVCs by EKG, referred for stress testing. K was 3.9, Mg 1.9 07/2019 dobutamine nuclear stress: inferior scar, no current ischemia. LVEF 37% by nuclear     - no chest pains. Chronic SOB/DOE     2. Chronic systolic HF - 08/3662 echo LVEF 40-45% - chronic SOB related to his COPD, some cough. No recent edema.     - last visit we started bisoprolol 2.5mg  daily. Reported dizziness, tried taking at night but reported insomnia - we started losartan 25mg  daily.   - no recent edema. No orthopnea    3. Mitral regurgitation - moderate by 09/2019 echo   4. Carotid stenosis   - prior bilateral carotid endarterectomies, recent US shows good patency. Followed by vascular       5. HL - compliant with statin - labs followed by pcp     6. 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        Past Medical History:  Diagnosis Date   CAD (coronary artery disease)    Normal LV function .Status post stent placement to the right coronary artery in 2000 and 2001Obstructive coronary artery disease in November 2008 by catheterization    Carotid artery disease ([place])    COPD (chronic obstructive pulmonary disease) ([facility])    GERD (gastroesophageal reflux disease)    Hyperlipidemia    Hypertension    pt denies, no meds     Allergies  Allergen Reactions   Clarithromycin Nausea Only   Other Itching, Rash and Other (See Comments)    Some medication OTC he took for headache caused a rash     Current Outpatient Medications  Medication  Sig Dispense Refill   aspirin 81 MG tablet Take 1 tablet (81 mg total) by mouth daily. 30 tablet    atorvastatin (LIPITOR) 80 MG tablet TAKE ONE TABLET BY MOUTH DAILY 15 tablet 0   ezetimibe (ZETIA) 10 MG tablet Take 10 mg by mouth daily.     Fluticasone-Umeclidin-Vilant (TRELEGY ELLIPTA IN) Inhale into the lungs.     gabapentin (NEURONTIN) 300 MG capsule Take 300 mg by mouth daily at 6 (six) AM.     ipratropium (ATROVENT) 0.02 % nebulizer solution Inhale 3 mLs into the lungs every 6 (six) hours as needed.     losartan (COZAAR) 25 MG tablet Take 1 tablet (25 mg total) by mouth daily. 90 tablet 1   nitroGLYCERIN (NITROSTAT) 0.4 MG SL tablet DISSOLVE 1 TABLET BY MOUTH UNDER TONGUE EVERY 5 MINUTES AS NEEDED FOR CHEST PAIN. DO NOT EXCEED A TOTAL OF 3 DOSES IN 15 MIN 25 tablet 0   ondansetron (ZOFRAN-ODT) 8 MG disintegrating tablet Take 8 mg by mouth every 8 (eight) hours as needed.     pantoprazole (PROTONIX) 40 MG tablet TAKE 1 TABLET BY MOUTH DAILY BEFORE BREAKFAST 30 tablet 5   predniSONE (DELTASONE) 20 MG tablet Take 20 mg by mouth daily with breakfast.     PROAIR HFA 108 (90 Base) MCG/ACT inhaler Inhale 2 puffs into the lungs  every 6 (six) hours as needed for wheezing or shortness of breath.      valACYclovir (VALTREX) 1000 MG tablet Take 1,000 mg by mouth daily.     WIXELA INHUB 250-50 MCG/DOSE AEPB Inhale 1 puff into the lungs 2 (two) times a day.     No current facility-administered medications for this visit.     Past Surgical History:  Procedure Laterality Date   CARDIAC CATHETERIZATION  02/2007   cardiac stents     2 stents placed    CAROTID ENDARTERECTOMY Bilateral 2010      (here [facility])  CE   COLONOSCOPY N/A 05/23/2017   Procedure: COLONOSCOPY;  Surgeon: Rogene Houston, MD;  [facility];  Service: Endoscopy;  Laterality: N/A;   ESOPHAGOGASTRODUODENOSCOPY N/A 05/23/2017   Procedure: ESOPHAGOGASTRODUODENOSCOPY (EGD);  Surgeon: Rogene Houston, MD;  [facility];  Service: Endoscopy;  Laterality: N/A;   FRACTURE SURGERY     right arm   HYDROCELE EXCISION Right 04/24/2013   Procedure: HYDROCELECTOMY ADULT;  Surgeon: Hanley Ben, MD;  Location: [place];  Service: Urology;  Laterality: Right;   JOINT REPLACEMENT Left Nov. 3, 2015   Hip   TOTAL HIP ARTHROPLASTY Left 02/19/2014   Procedure: TOTAL HIP ARTHROPLASTY;  Surgeon: Garald Balding, MD;  Location: [place];  Service: Orthopedics;  Laterality: Left;     Allergies  Allergen Reactions   Clarithromycin Nausea Only   Other Itching, Rash and Other (See Comments)    Some medication OTC he took for headache caused a rash      Family History  Problem Relation Age of Onset   Heart disease Mother        Before age 42   Heart disease Father    Heart attack Father    Colon cancer Neg Hx      Social History [name] reports that he has been smoking cigarettes. He started smoking about 67 years ago. He has a 55.00 pack-year smoking history. His smokeless tobacco use includes chew. [name] reports no history of alcohol use.   Review of Systems CONSTITUTIONAL: No weight loss, fever, chills, weakness or fatigue.  HEENT: Eyes: No visual loss, blurred vision, double vision or yellow sclerae.No hearing loss, sneezing, congestion, runny nose or sore throat.  SKIN: No rash or itching.  CARDIOVASCULAR: per hpi RESPIRATORY: No shortness of breath, cough or sputum.  GASTROINTESTINAL: No anorexia, nausea, vomiting or diarrhea. No abdominal pain or blood.  GENITOURINARY: No burning on urination, no polyuria NEUROLOGICAL: No headache, dizziness, syncope, paralysis, ataxia, numbness or tingling in the extremities. No change in bowel or bladder control.  MUSCULOSKELETAL: No muscle, back pain, joint pain or stiffness.  LYMPHATICS: No enlarged nodes. No history of splenectomy.  PSYCHIATRIC: No history of depression or anxiety.  ENDOCRINOLOGIC: No reports of sweating, cold or  heat intolerance. No polyuria or polydipsia.  Marland Kitchen   Physical Examination Today's Vitals   10/15/20 1439  BP: 124/70  Pulse: 81  SpO2: 96%  Weight: 182 lb (82.6 kg)  Height: 6' 3.5" (1.918 m)   Body mass index is 22.45 kg/m.  Gen: resting comfortably, no acute distress HEENT: no scleral icterus, pupils equal round and reactive, no palptable cervical adenopathy,  CV: RRR, no m/r/g, no jvd Resp: Clear to auscultation bilaterally GI: abdomen is soft, non-tender, non-distended, normal bowel sounds, no hepatosplenomegaly MSK: extremities are warm, no edema.  Skin: warm, no rash Neuro:  no focal deficits Psych: appropriate  affect   Diagnostic Studies 12/12 DSE: Baseline LVEF 55-60%. Negative for ischemia.     07/2012 Carotids: patent bilaterally w/ minimal plaque/hyperplasia, vertebals normal bilateral     01/02/13 EKG: SR, normal axis, no ischemic changes   03/2015 Dobutamine nuclear stress Defect 1: There is a small defect of moderate severity present in the basal inferoseptal, mid inferoseptal, apical septal and apical inferior location. This is most likely due to soft tissue attenuation artifact. However, myocardial scar cannot entirely be ruled out. This is a low risk study. No ischemic territories. Nuclear stress EF: 58%.     09/2017 Dobutamine nuclear stress There was no ST segment deviation noted during stress. Findings consistent with prior inferoseptal infarct without current ischemia. Small apical infarct with mild peri-infarct ischemia. This is a low risk study. The left ventricular ejection fraction is normal (55-65%).     07/2019 nuclear stress Horizontal ST segment depression ST segment depression of 0.5 mm was noted during stress in the V4 and V5 leads. Inversions in leads2, 3, and aVF seen at baseline and slightly more pronounced with dobutamine. Defect 1: There is a large defect of severe severity present in the basal inferoseptal, basal inferior, mid  inferoseptal, mid inferior and apical inferior location. Findings consistent with prior myocardial infarction. No significant ischemic territories. This is a high risk study. Nuclear stress EF: 37%.   09/2019 echo 1. Left ventricular ejection fraction, by estimation, is 40 to 45%. The  left ventricle has moderately decreased function. The left ventricle  demonstrates regional wall motion abnormalities (see scoring  diagram/findings for description). Left ventricular   diastolic parameters are consistent with Grade I diastolic dysfunction  (impaired relaxation).   2. Right ventricular systolic function is normal. The right ventricular  size is normal. There is normal pulmonary artery systolic pressure. The  estimated right ventricular systolic pressure is 47.4 mmHg.   3. Left atrial size was moderately dilated.   4. Right atrial size was mildly dilated.   5. The mitral valve is grossly normal, but mildly thickened with  restriction of posterior leaflet motion. Moderate mitral valve  regurgitation.   6. The aortic valve is tricuspid. Aortic valve regurgitation is trivial.   7. The inferior vena cava is normal in size with greater than 50%  respiratory variability, suggesting right atrial pressure of 3 mmHg.   Comparison(s): Nuclear stress test done 07/20/19 shoqed an EF of 37%.      Assessment and Plan   1. CAD - 07/2019 stress test without ischemia, - no recent symptoms, continue current meds - EKG shows SR, chronic ST/T changes   2. Chronic systolic HF - did not tolerate very low dose bisoprolol, does not appear will tolerate beta blocker - tolerating losartan 25mg  daily - repeat echo, if ongoing dysfunction would transition to entresto, consider aldactone and farxiga in the near future.        Arnoldo Lenis, M.D.

## 2020-10-16 DIAGNOSIS — I2510 Atherosclerotic heart disease of native coronary artery without angina pectoris: Secondary | ICD-10-CM | POA: Diagnosis not present

## 2020-10-16 DIAGNOSIS — Z720 Tobacco use: Secondary | ICD-10-CM | POA: Diagnosis not present

## 2020-10-16 DIAGNOSIS — R7301 Impaired fasting glucose: Secondary | ICD-10-CM | POA: Diagnosis not present

## 2020-10-16 DIAGNOSIS — E7849 Other hyperlipidemia: Secondary | ICD-10-CM | POA: Diagnosis not present

## 2020-10-24 DIAGNOSIS — F17210 Nicotine dependence, cigarettes, uncomplicated: Secondary | ICD-10-CM | POA: Diagnosis not present

## 2020-10-24 DIAGNOSIS — E7849 Other hyperlipidemia: Secondary | ICD-10-CM | POA: Diagnosis not present

## 2020-10-24 DIAGNOSIS — Z6822 Body mass index (BMI) 22.0-22.9, adult: Secondary | ICD-10-CM | POA: Diagnosis not present

## 2020-10-24 DIAGNOSIS — I679 Cerebrovascular disease, unspecified: Secondary | ICD-10-CM | POA: Diagnosis not present

## 2020-10-24 DIAGNOSIS — R42 Dizziness and giddiness: Secondary | ICD-10-CM | POA: Diagnosis not present

## 2020-10-24 DIAGNOSIS — I2510 Atherosclerotic heart disease of native coronary artery without angina pectoris: Secondary | ICD-10-CM | POA: Diagnosis not present

## 2020-10-24 DIAGNOSIS — J449 Chronic obstructive pulmonary disease, unspecified: Secondary | ICD-10-CM | POA: Diagnosis not present

## 2020-10-29 ENCOUNTER — Other Ambulatory Visit: Payer: Self-pay

## 2020-10-29 ENCOUNTER — Ambulatory Visit (HOSPITAL_COMMUNITY)
Admission: RE | Admit: 2020-10-29 | Discharge: 2020-10-29 | Disposition: A | Discharge status: Home / Self Care | Payer: Medicare HMO | Source: Ambulatory Visit | Attending: Cardiology | Admitting: Cardiology

## 2020-10-29 DIAGNOSIS — I5022 Chronic systolic (congestive) heart failure: Secondary | ICD-10-CM | POA: Insufficient documentation

## 2020-10-29 LAB — ECHOCARDIOGRAM COMPLETE
AR max vel: 2.15 cm2
AV Area VTI: 2.2 cm2
AV Area mean vel: 2.13 cm2
AV Mean grad: 3.3 mmHg
AV Peak grad: 6.9 mmHg
Ao pk vel: 1.31 m/s
Area-P 1/2: 2.32 cm2
MV M vel: 5.58 m/s
MV Peak grad: 124.5 mmHg
Radius: 0.5 cm
S' Lateral: 4.6 cm

## 2020-10-29 NOTE — Progress Notes (Signed)
*  PRELIMINARY RESULTS* Echocardiogram 2D Echocardiogram has been performed.  Samuel Germany 10/29/2020, 1:46 PM

## 2020-11-12 ENCOUNTER — Telehealth: Payer: Self-pay | Admitting: *Deleted

## 2020-11-12 NOTE — Telephone Encounter (Signed)
-----   Message from Arnoldo Lenis, MD sent at 11/10/2020 10:26 AM EDT ----- Heart function has improved and is now in the low normal range, continue current medications  Zandra Abts MD

## 2020-11-12 NOTE — Telephone Encounter (Signed)
Laurine Blazer, LPN  [id number]  [id number] PM EDT Back to Top     Notified, copy to pcp.

## 2020-11-16 DIAGNOSIS — E7849 Other hyperlipidemia: Secondary | ICD-10-CM | POA: Diagnosis not present

## 2020-11-16 DIAGNOSIS — R7301 Impaired fasting glucose: Secondary | ICD-10-CM | POA: Diagnosis not present

## 2020-11-16 DIAGNOSIS — Z720 Tobacco use: Secondary | ICD-10-CM | POA: Diagnosis not present

## 2020-11-16 DIAGNOSIS — I2510 Atherosclerotic heart disease of native coronary artery without angina pectoris: Secondary | ICD-10-CM | POA: Diagnosis not present

## 2020-11-26 ENCOUNTER — Other Ambulatory Visit: Payer: Self-pay | Admitting: Cardiology

## 2021-01-16 DIAGNOSIS — R7301 Impaired fasting glucose: Secondary | ICD-10-CM | POA: Diagnosis not present

## 2021-01-16 DIAGNOSIS — Z720 Tobacco use: Secondary | ICD-10-CM | POA: Diagnosis not present

## 2021-01-16 DIAGNOSIS — I2510 Atherosclerotic heart disease of native coronary artery without angina pectoris: Secondary | ICD-10-CM | POA: Diagnosis not present

## 2021-01-16 DIAGNOSIS — E7849 Other hyperlipidemia: Secondary | ICD-10-CM | POA: Diagnosis not present

## 2021-01-19 DIAGNOSIS — Z6821 Body mass index (BMI) 21.0-21.9, adult: Secondary | ICD-10-CM | POA: Diagnosis not present

## 2021-01-19 DIAGNOSIS — H9201 Otalgia, right ear: Secondary | ICD-10-CM | POA: Diagnosis not present

## 2021-01-19 DIAGNOSIS — F17210 Nicotine dependence, cigarettes, uncomplicated: Secondary | ICD-10-CM | POA: Diagnosis not present

## 2021-01-19 DIAGNOSIS — H6123 Impacted cerumen, bilateral: Secondary | ICD-10-CM | POA: Diagnosis not present

## 2021-01-19 DIAGNOSIS — Z23 Encounter for immunization: Secondary | ICD-10-CM | POA: Diagnosis not present

## 2021-02-05 ENCOUNTER — Encounter: Payer: Self-pay | Admitting: Cardiology

## 2021-02-05 ENCOUNTER — Encounter: Payer: Self-pay | Admitting: *Deleted

## 2021-02-05 ENCOUNTER — Ambulatory Visit: Payer: Medicare HMO | Admitting: Cardiology

## 2021-02-05 VITALS — BP 120/62 | HR 91 | Ht 75.5 in | Wt 181.0 lb

## 2021-02-05 DIAGNOSIS — I2510 Atherosclerotic heart disease of native coronary artery without angina pectoris: Secondary | ICD-10-CM | POA: Diagnosis not present

## 2021-02-05 DIAGNOSIS — I5022 Chronic systolic (congestive) heart failure: Secondary | ICD-10-CM | POA: Diagnosis not present

## 2021-02-05 DIAGNOSIS — E782 Mixed hyperlipidemia: Secondary | ICD-10-CM

## 2021-02-05 NOTE — Progress Notes (Signed)
Clinical Summary Mr. Dengel is a 80 y.o.maleseen today for follow up of the following medical problems.   1. CAD: prior stents in 2000 and 2001. cath 2008 w/ nonobstructive disease. Normal LVEF 65% by echo 2012. - nuclear stress 03/2015 which showed no specific ischemia.   09/2017 dobutmaine nuclear stress inferoseptal scar, small apical infarct with mild peri-infarct ischemia. Low risk study.      During 07/2019 visit with PA Leonides Sake noted to have frequent PVCs by EKG, referred for stress testing. K was 3.9, Mg 1.9 07/2019 dobutamine nuclear stress: inferior scar, no current ischemia. LVEF 37% by nuclear     - denies any chest pain, chronic SOB he relates to his COPD     2. Chronic systolic HF - 08/4006 echo LVEF 40-45% - chronic SOB related to his COPD, some cough. No recent edema.      - we started bisoprolol 2.5mg  daily. Reported dizziness, tried taking at night but reported insomnia - we started losartan 25mg  daily and tolerated well    10/2020 echo LVEF 50%, grade I dd, normal RV, mild MR - no recent edema   3. Mitral regurgitation - moderate by 09/2019 echo - 10/2020 mild MR   4. Carotid stenosis   - prior bilateral carotid endarterectomies, recent US shows good patency. Followed by vascular  - 10/2019 mild bialteral disease     5. Hyperlipidemia - labs followed by pcp - he is on atorvastatin and zetia.      6. 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     Past Medical History:  Diagnosis Date   CAD (coronary artery disease)    Normal LV function .Status post stent placement to the right coronary artery in 2000 and 2001Obstructive coronary artery disease in November 2008 by catheterization    Carotid artery disease ([place])    COPD (chronic obstructive pulmonary disease) (H[facility])    GERD (gastroesophageal reflux disease)    Hyperlipidemia    Hypertension    pt denies, no meds     Allergies  Allergen Reactions   Clarithromycin Nausea Only   Other Itching,  Rash and Other (See Comments)    Some medication OTC he took for headache caused a rash     Current Outpatient Medications  Medication Sig Dispense Refill   aspirin 81 MG tablet Take 1 tablet (81 mg total) by mouth daily. 30 tablet    atorvastatin (LIPITOR) 80 MG tablet TAKE ONE TABLET BY MOUTH DAILY 15 tablet 0   ezetimibe (ZETIA) 10 MG tablet Take 10 mg by mouth daily.     Fluticasone-Umeclidin-Vilant (TRELEGY ELLIPTA IN) Inhale into the lungs.     gabapentin (NEURONTIN) 300 MG capsule Take 300 mg by mouth daily at 6 (six) AM.     ipratropium (ATROVENT) 0.02 % nebulizer solution Inhale 3 mLs into the lungs every 6 (six) hours as needed.     losartan (COZAAR) 25 MG tablet Take 1 tablet (25 mg total) by mouth daily. 90 tablet 1   nitroGLYCERIN (NITROSTAT) 0.4 MG SL tablet DISSOLVE 1 TABLET BY MOUTH UNDER TONGUE EVERY 5 MINUTES AS NEEDED FOR CHEST PAIN. DO NOT EXCEED A TOTAL OF 3 DOSES IN 15 MIN 25 tablet 0   ondansetron (ZOFRAN-ODT) 8 MG disintegrating tablet Take 8 mg by mouth every 8 (eight) hours as needed.     pantoprazole (PROTONIX) 40 MG tablet TAKE 1 TABLET BY MOUTH DAILY BEFORE BREAKFAST 30 tablet 5   predniSONE (DELTASONE) 20 MG tablet  Take 20 mg by mouth daily with breakfast.     PROAIR HFA 108 (90 Base) MCG/ACT inhaler Inhale 2 puffs into the lungs every 6 (six) hours as needed for wheezing or shortness of breath.      WIXELA INHUB 250-50 MCG/DOSE AEPB Inhale 1 puff into the lungs 2 (two) times a day.     No current facility-administered medications for this visit.     Past Surgical History:  Procedure Laterality Date   CARDIAC CATHETERIZATION  02/2007   cardiac stents     2 stents placed    CAROTID ENDARTERECTOMY Bilateral 2010      (here at [facility])  CE   COLONOSCOPY N/A 05/23/2017   Procedure: COLONOSCOPY;  Surgeon: Rogene Houston, MD;  [facility];  Service: Endoscopy;  Laterality: N/A;   ESOPHAGOGASTRODUODENOSCOPY N/A 05/23/2017   Procedure:  ESOPHAGOGASTRODUODENOSCOPY (EGD);  Surgeon: Rogene Houston, MD;  [facility];  Service: Endoscopy;  Laterality: N/A;   FRACTURE SURGERY     right arm   HYDROCELE EXCISION Right 04/24/2013   Procedure: HYDROCELECTOMY ADULT;  Surgeon: Hanley Ben, MD;  Location: [place];  Service: Urology;  Laterality: Right;   JOINT REPLACEMENT Left Nov. 3, 2015   Hip   TOTAL HIP ARTHROPLASTY Left 02/19/2014   Procedure: TOTAL HIP ARTHROPLASTY;  Surgeon: Garald Balding, MD;  Location: [place];  Service: Orthopedics;  Laterality: Left;     Allergies  Allergen Reactions   Clarithromycin Nausea Only   Other Itching, Rash and Other (See Comments)    Some medication OTC he took for headache caused a rash      Family History  Problem Relation Age of Onset   Heart disease Mother        Before age 2   Heart disease Father    Heart attack Father    Colon cancer Neg Hx      Social History Mr. Winker reports that he has been smoking cigarettes. He started smoking about 67 years ago. He has a 55.00 pack-year smoking history. His smokeless tobacco use includes chew. Mr. Lacko reports no history of alcohol use.   Review of Systems CONSTITUTIONAL: No weight loss, fever, chills, weakness or fatigue.  HEENT: Eyes: No visual loss, blurred vision, double vision or yellow sclerae.No hearing loss, sneezing, congestion, runny nose or sore throat.  SKIN: No rash or itching.  CARDIOVASCULAR: per hpi RESPIRATORY: No shortness of breath, cough or sputum.  GASTROINTESTINAL: No anorexia, nausea, vomiting or diarrhea. No abdominal pain or blood.  GENITOURINARY: No burning on urination, no polyuria NEUROLOGICAL: No headache, dizziness, syncope, paralysis, ataxia, numbness or tingling in the extremities. No change in bowel or bladder control.  MUSCULOSKELETAL: No muscle, back pain, joint pain or stiffness.  LYMPHATICS: No enlarged nodes. No history of splenectomy.  PSYCHIATRIC: No  history of depression or anxiety.  ENDOCRINOLOGIC: No reports of sweating, cold or heat intolerance. No polyuria or polydipsia.  Marland Kitchen   Physical Examination Today's Vitals   02/05/21 0824  BP: 120/62  Pulse: 91  SpO2: 95%  Weight: 181 lb (82.1 kg)  Height: 6' 3.5" (1.918 m)   Body mass index is 22.32 kg/m.  Gen: resting comfortably, no acute distress HEENT: no scleral icterus, pupils equal round and reactive, no palptable cervical adenopathy,  CV: RRR, no m/r/g no jvd Resp: Clear to auscultation bilaterally GI: abdomen is soft, non-tender, non-distended, normal bowel sounds, no hepatosplenomegaly MSK: extremities are warm, no edema.  Skin:  warm, no rash Neuro:  no focal deficits Psych: appropriate affect   Diagnostic Studies 12/12 DSE: Baseline LVEF 55-60%. Negative for ischemia.     07/2012 Carotids: patent bilaterally w/ minimal plaque/hyperplasia, vertebals normal bilateral     01/02/13 EKG: SR, normal axis, no ischemic changes   03/2015 Dobutamine nuclear stress Defect 1: There is a small defect of moderate severity present in the basal inferoseptal, mid inferoseptal, apical septal and apical inferior location. This is most likely due to soft tissue attenuation artifact. However, myocardial scar cannot entirely be ruled out. This is a low risk study. No ischemic territories. Nuclear stress EF: 58%.     09/2017 Dobutamine nuclear stress There was no ST segment deviation noted during stress. Findings consistent with prior inferoseptal infarct without current ischemia. Small apical infarct with mild peri-infarct ischemia. This is a low risk study. The left ventricular ejection fraction is normal (55-65%).     07/2019 nuclear stress Horizontal ST segment depression ST segment depression of 0.5 mm was noted during stress in the V4 and V5 leads. Inversions in leads2, 3, and aVF seen at baseline and slightly more pronounced with dobutamine. Defect 1: There is a large defect  of severe severity present in the basal inferoseptal, basal inferior, mid inferoseptal, mid inferior and apical inferior location. Findings consistent with prior myocardial infarction. No significant ischemic territories. This is a high risk study. Nuclear stress EF: 37%.   09/2019 echo 1. Left ventricular ejection fraction, by estimation, is 40 to 45%. The  left ventricle has moderately decreased function. The left ventricle  demonstrates regional wall motion abnormalities (see scoring  diagram/findings for description). Left ventricular   diastolic parameters are consistent with Grade I diastolic dysfunction  (impaired relaxation).   2. Right ventricular systolic function is normal. The right ventricular  size is normal. There is normal pulmonary artery systolic pressure. The  estimated right ventricular systolic pressure is 03.5 mmHg.   3. Left atrial size was moderately dilated.   4. Right atrial size was mildly dilated.   5. The mitral valve is grossly normal, but mildly thickened with  restriction of posterior leaflet motion. Moderate mitral valve  regurgitation.   6. The aortic valve is tricuspid. Aortic valve regurgitation is trivial.   7. The inferior vena cava is normal in size with greater than 50%  respiratory variability, suggesting right atrial pressure of 3 mmHg.   Comparison(s): Nuclear stress test done 07/20/19 shoqed an EF of 37%.  10/2020 echo IMPRESSIONS     1. Left ventricular ejection fraction, by estimation, is 50%. The left  ventricle has low normal function. The left ventricle has no regional wall  motion abnormalities. Left ventricular diastolic parameters are consistent  with Grade I diastolic  dysfunction (impaired relaxation).   2. Right ventricular systolic function is normal. The right ventricular  size is normal.   3. The mitral valve is normal in structure. Mild mitral valve  regurgitation. No evidence of mitral stenosis.   4. The aortic valve has  an indeterminant number of cusps. Aortic valve  regurgitation is not visualized. No aortic stenosis is present.   5. The inferior vena cava is normal in size with greater than 50%  respiratory variability, suggesting right atrial pressure of 3 mmHg.      Assessment and Plan  1. CAD - 07/2019 stress test without ischemia, - denies any symptoms, continue current meds   2. Chronic systolic HF - did not tolerate very low dose bisoprolol, does not  appear will tolerate beta blocker - tolerating losartan 25mg  daily - repeat echo shows that LVEF has normalized - no symptoms - continue current meds  3. Hyperlipdiemia - request pcp labs, continue current meds  F/u 6 months      Arnoldo Lenis, M.D.

## 2021-02-05 NOTE — Patient Instructions (Signed)

## 2021-02-16 ENCOUNTER — Ambulatory Visit: Payer: Medicare HMO | Admitting: Cardiology

## 2021-04-14 DIAGNOSIS — J441 Chronic obstructive pulmonary disease with (acute) exacerbation: Secondary | ICD-10-CM | POA: Diagnosis not present

## 2021-04-14 DIAGNOSIS — M545 Low back pain, unspecified: Secondary | ICD-10-CM | POA: Diagnosis not present

## 2021-04-14 DIAGNOSIS — F17210 Nicotine dependence, cigarettes, uncomplicated: Secondary | ICD-10-CM | POA: Diagnosis not present

## 2021-04-14 DIAGNOSIS — Z20828 Contact with and (suspected) exposure to other viral communicable diseases: Secondary | ICD-10-CM | POA: Diagnosis not present

## 2021-04-17 DIAGNOSIS — E7849 Other hyperlipidemia: Secondary | ICD-10-CM | POA: Diagnosis not present

## 2021-04-17 DIAGNOSIS — R7301 Impaired fasting glucose: Secondary | ICD-10-CM | POA: Diagnosis not present

## 2021-04-17 DIAGNOSIS — I2510 Atherosclerotic heart disease of native coronary artery without angina pectoris: Secondary | ICD-10-CM | POA: Diagnosis not present

## 2021-04-17 DIAGNOSIS — Z720 Tobacco use: Secondary | ICD-10-CM | POA: Diagnosis not present

## 2021-05-06 DIAGNOSIS — Z20828 Contact with and (suspected) exposure to other viral communicable diseases: Secondary | ICD-10-CM | POA: Diagnosis not present

## 2021-05-06 DIAGNOSIS — Z6821 Body mass index (BMI) 21.0-21.9, adult: Secondary | ICD-10-CM | POA: Diagnosis not present

## 2021-05-06 DIAGNOSIS — F17210 Nicotine dependence, cigarettes, uncomplicated: Secondary | ICD-10-CM | POA: Diagnosis not present

## 2021-05-06 DIAGNOSIS — R5383 Other fatigue: Secondary | ICD-10-CM | POA: Diagnosis not present

## 2021-06-12 DIAGNOSIS — H524 Presbyopia: Secondary | ICD-10-CM | POA: Diagnosis not present

## 2021-08-26 DIAGNOSIS — I2510 Atherosclerotic heart disease of native coronary artery without angina pectoris: Secondary | ICD-10-CM | POA: Diagnosis not present

## 2021-08-26 DIAGNOSIS — R296 Repeated falls: Secondary | ICD-10-CM | POA: Diagnosis not present

## 2021-08-26 DIAGNOSIS — I10 Essential (primary) hypertension: Secondary | ICD-10-CM | POA: Diagnosis not present

## 2021-08-26 DIAGNOSIS — R531 Weakness: Secondary | ICD-10-CM | POA: Diagnosis not present

## 2021-08-26 DIAGNOSIS — J189 Pneumonia, unspecified organism: Secondary | ICD-10-CM | POA: Diagnosis not present

## 2021-08-26 DIAGNOSIS — F17210 Nicotine dependence, cigarettes, uncomplicated: Secondary | ICD-10-CM | POA: Diagnosis not present

## 2021-08-26 DIAGNOSIS — E7849 Other hyperlipidemia: Secondary | ICD-10-CM | POA: Diagnosis not present

## 2021-08-26 DIAGNOSIS — E782 Mixed hyperlipidemia: Secondary | ICD-10-CM | POA: Diagnosis not present

## 2021-08-26 DIAGNOSIS — R6883 Chills (without fever): Secondary | ICD-10-CM | POA: Diagnosis not present

## 2021-08-26 DIAGNOSIS — E559 Vitamin D deficiency, unspecified: Secondary | ICD-10-CM | POA: Diagnosis not present

## 2021-08-26 DIAGNOSIS — R7309 Other abnormal glucose: Secondary | ICD-10-CM | POA: Diagnosis not present

## 2021-08-26 DIAGNOSIS — J441 Chronic obstructive pulmonary disease with (acute) exacerbation: Secondary | ICD-10-CM | POA: Diagnosis not present

## 2021-08-27 DIAGNOSIS — R300 Dysuria: Secondary | ICD-10-CM | POA: Diagnosis not present

## 2021-09-07 DIAGNOSIS — E559 Vitamin D deficiency, unspecified: Secondary | ICD-10-CM | POA: Diagnosis not present

## 2021-09-07 DIAGNOSIS — H6090 Unspecified otitis externa, unspecified ear: Secondary | ICD-10-CM | POA: Diagnosis not present

## 2021-09-07 DIAGNOSIS — J449 Chronic obstructive pulmonary disease, unspecified: Secondary | ICD-10-CM | POA: Diagnosis not present

## 2021-09-07 DIAGNOSIS — Z1389 Encounter for screening for other disorder: Secondary | ICD-10-CM | POA: Diagnosis not present

## 2021-09-07 DIAGNOSIS — E7849 Other hyperlipidemia: Secondary | ICD-10-CM | POA: Diagnosis not present

## 2021-09-07 DIAGNOSIS — Z1331 Encounter for screening for depression: Secondary | ICD-10-CM | POA: Diagnosis not present

## 2021-09-07 DIAGNOSIS — R911 Solitary pulmonary nodule: Secondary | ICD-10-CM | POA: Diagnosis not present

## 2021-09-07 DIAGNOSIS — Z23 Encounter for immunization: Secondary | ICD-10-CM | POA: Diagnosis not present

## 2021-09-11 DIAGNOSIS — R7989 Other specified abnormal findings of blood chemistry: Secondary | ICD-10-CM | POA: Diagnosis not present

## 2021-09-16 DIAGNOSIS — I10 Essential (primary) hypertension: Secondary | ICD-10-CM | POA: Diagnosis not present

## 2021-09-16 DIAGNOSIS — E782 Mixed hyperlipidemia: Secondary | ICD-10-CM | POA: Diagnosis not present

## 2021-09-16 DIAGNOSIS — J449 Chronic obstructive pulmonary disease, unspecified: Secondary | ICD-10-CM | POA: Diagnosis not present

## 2021-09-16 DIAGNOSIS — K2100 Gastro-esophageal reflux disease with esophagitis, without bleeding: Secondary | ICD-10-CM | POA: Diagnosis not present

## 2021-09-18 DIAGNOSIS — I700 Atherosclerosis of aorta: Secondary | ICD-10-CM | POA: Diagnosis not present

## 2021-09-18 DIAGNOSIS — J9811 Atelectasis: Secondary | ICD-10-CM | POA: Diagnosis not present

## 2021-09-18 DIAGNOSIS — R911 Solitary pulmonary nodule: Secondary | ICD-10-CM | POA: Diagnosis not present

## 2021-09-18 DIAGNOSIS — R918 Other nonspecific abnormal finding of lung field: Secondary | ICD-10-CM | POA: Diagnosis not present

## 2021-09-18 DIAGNOSIS — J439 Emphysema, unspecified: Secondary | ICD-10-CM | POA: Diagnosis not present

## 2021-09-18 DIAGNOSIS — J9809 Other diseases of bronchus, not elsewhere classified: Secondary | ICD-10-CM | POA: Diagnosis not present

## 2021-09-25 DIAGNOSIS — R059 Cough, unspecified: Secondary | ICD-10-CM | POA: Diagnosis not present

## 2021-09-25 DIAGNOSIS — J449 Chronic obstructive pulmonary disease, unspecified: Secondary | ICD-10-CM | POA: Diagnosis not present

## 2021-09-25 DIAGNOSIS — F17210 Nicotine dependence, cigarettes, uncomplicated: Secondary | ICD-10-CM | POA: Diagnosis not present

## 2021-10-17 DIAGNOSIS — J449 Chronic obstructive pulmonary disease, unspecified: Secondary | ICD-10-CM | POA: Diagnosis not present

## 2021-10-17 DIAGNOSIS — R030 Elevated blood-pressure reading, without diagnosis of hypertension: Secondary | ICD-10-CM | POA: Diagnosis not present

## 2021-10-17 DIAGNOSIS — H6122 Impacted cerumen, left ear: Secondary | ICD-10-CM | POA: Diagnosis not present

## 2021-10-17 DIAGNOSIS — Z6820 Body mass index (BMI) 20.0-20.9, adult: Secondary | ICD-10-CM | POA: Diagnosis not present

## 2021-10-17 DIAGNOSIS — F17210 Nicotine dependence, cigarettes, uncomplicated: Secondary | ICD-10-CM | POA: Diagnosis not present

## 2021-10-17 DIAGNOSIS — R918 Other nonspecific abnormal finding of lung field: Secondary | ICD-10-CM | POA: Diagnosis not present

## 2021-10-17 DIAGNOSIS — H6121 Impacted cerumen, right ear: Secondary | ICD-10-CM | POA: Diagnosis not present

## 2021-11-19 ENCOUNTER — Telehealth: Payer: Self-pay | Admitting: Cardiology

## 2021-11-19 ENCOUNTER — Ambulatory Visit: Payer: Medicare HMO | Admitting: Cardiology

## 2021-11-19 NOTE — Progress Notes (Deleted)
Clinical Summary Joseph Forbes is a 81 y.o.male  seen today for follow up of the following medical problems.   1. CAD: prior stents in 2000 and 2001. cath 2008 w/ nonobstructive disease. Normal LVEF 65% by echo 2012. - nuclear stress 03/2015 which showed no specific ischemia.   09/2017 dobutmaine nuclear stress inferoseptal scar, small apical infarct with mild peri-infarct ischemia. Low risk study.      During 07/2019 visit with PA Leonides Sake noted to have frequent PVCs by EKG, referred for stress testing. K was 3.9, Mg 1.9 07/2019 dobutamine nuclear stress: inferior scar, no current ischemia. LVEF 37% by nuclear     - denies any chest pain, chronic SOB he relates to his COPD     2. Chronic systolic HF - 11/4694 echo LVEF 40-45% - chronic SOB related to his COPD, some cough. No recent edema.      - we started bisoprolol 2.'5mg'$  daily. Reported dizziness, tried taking at night but reported insomnia - we started losartan '25mg'$  daily and tolerated well    10/2020 echo LVEF 50%, grade I dd, normal RV, mild MR - no recent edema   3. Mitral regurgitation - moderate by 09/2019 echo - 10/2020 mild MR   4. Carotid stenosis   - prior bilateral carotid endarterectomies, recent US shows good patency. Followed by vascular  - 10/2019 mild bialteral disease     5. Hyperlipidemia - labs followed by pcp - he is on atorvastatin and zetia.      6. 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 Past Medical History:  Diagnosis Date   CAD (coronary artery disease)    Normal LV function .Status post stent placement to the right coronary artery in 2000 and 2001Obstructive coronary artery disease in November 2008 by catheterization    Carotid artery disease ([place])    COPD (chronic obstructive pulmonary disease) (H[facility])    GERD (gastroesophageal reflux disease)    Hyperlipidemia    Hypertension    pt denies, no meds     Allergies  Allergen Reactions   Clarithromycin Nausea Only   Other Itching, Rash  and Other (See Comments)    Some medication OTC he took for headache caused a rash     Current Outpatient Medications  Medication Sig Dispense Refill   aspirin 81 MG tablet Take 1 tablet (81 mg total) by mouth daily. 30 tablet    atorvastatin (LIPITOR) 80 MG tablet TAKE ONE TABLET BY MOUTH DAILY 15 tablet 0   ezetimibe (ZETIA) 10 MG tablet Take 10 mg by mouth daily.     Fluticasone-Umeclidin-Vilant (TRELEGY ELLIPTA IN) Inhale into the lungs.     gabapentin (NEURONTIN) 300 MG capsule Take 300 mg by mouth daily at 6 (six) AM.     ipratropium (ATROVENT) 0.02 % nebulizer solution Inhale 3 mLs into the lungs every 6 (six) hours as needed.     losartan (COZAAR) 25 MG tablet Take 1 tablet (25 mg total) by mouth daily. 90 tablet 1   nitroGLYCERIN (NITROSTAT) 0.4 MG SL tablet DISSOLVE 1 TABLET BY MOUTH UNDER TONGUE EVERY 5 MINUTES AS NEEDED FOR CHEST PAIN. DO NOT EXCEED A TOTAL OF 3 DOSES IN 15 MIN 25 tablet 0   ondansetron (ZOFRAN-ODT) 8 MG disintegrating tablet Take 8 mg by mouth every 8 (eight) hours as needed.     pantoprazole (PROTONIX) 40 MG tablet TAKE 1 TABLET BY MOUTH DAILY BEFORE BREAKFAST 30 tablet 5   predniSONE (DELTASONE) 20 MG tablet Take 20  mg by mouth daily with breakfast.     PROAIR HFA 108 (90 Base) MCG/ACT inhaler Inhale 2 puffs into the lungs every 6 (six) hours as needed for wheezing or shortness of breath.      WIXELA INHUB 250-50 MCG/DOSE AEPB Inhale 1 puff into the lungs 2 (two) times a day.     No current facility-administered medications for this visit.     Past Surgical History:  Procedure Laterality Date   CARDIAC CATHETERIZATION  02/2007   cardiac stents     2 stents placed    CAROTID ENDARTERECTOMY Bilateral 2010      (here at [facility])  CE   COLONOSCOPY N/A 05/23/2017   Procedure: COLONOSCOPY;  Surgeon: Rogene Houston, MD;  [facility];  Service: Endoscopy;  Laterality: N/A;   ESOPHAGOGASTRODUODENOSCOPY N/A 05/23/2017   Procedure:  ESOPHAGOGASTRODUODENOSCOPY (EGD);  Surgeon: Rogene Houston, MD;  [facility];  Service: Endoscopy;  Laterality: N/A;   FRACTURE SURGERY     right arm   HYDROCELE EXCISION Right 04/24/2013   Procedure: HYDROCELECTOMY ADULT;  Surgeon: Hanley Ben, MD;  Location: [place];  Service: Urology;  Laterality: Right;   JOINT REPLACEMENT Left Nov. 3, 2015   Hip   TOTAL HIP ARTHROPLASTY Left 02/19/2014   Procedure: TOTAL HIP ARTHROPLASTY;  Surgeon: Garald Balding, MD;  Location: [place];  Service: Orthopedics;  Laterality: Left;     Allergies  Allergen Reactions   Clarithromycin Nausea Only   Other Itching, Rash and Other (See Comments)    Some medication OTC he took for headache caused a rash      Family History  Problem Relation Age of Onset   Heart disease Mother        Before age 74   Heart disease Father    Heart attack Father    Colon cancer Neg Hx      Social History Joseph Forbes reports that he has been smoking cigarettes. He started smoking about 68 years ago. He has a 55.00 pack-year smoking history. His smokeless tobacco use includes chew. Joseph Forbes reports no history of alcohol use.   Review of Systems CONSTITUTIONAL: No weight loss, fever, chills, weakness or fatigue.  HEENT: Eyes: No visual loss, blurred vision, double vision or yellow sclerae.No hearing loss, sneezing, congestion, runny nose or sore throat.  SKIN: No rash or itching.  CARDIOVASCULAR:  RESPIRATORY: No shortness of breath, cough or sputum.  GASTROINTESTINAL: No anorexia, nausea, vomiting or diarrhea. No abdominal pain or blood.  GENITOURINARY: No burning on urination, no polyuria NEUROLOGICAL: No headache, dizziness, syncope, paralysis, ataxia, numbness or tingling in the extremities. No change in bowel or bladder control.  MUSCULOSKELETAL: No muscle, back pain, joint pain or stiffness.  LYMPHATICS: No enlarged nodes. No history of splenectomy.  PSYCHIATRIC: No history  of depression or anxiety.  ENDOCRINOLOGIC: No reports of sweating, cold or heat intolerance. No polyuria or polydipsia.  Marland Kitchen   Physical Examination There were no vitals filed for this visit. There were no vitals filed for this visit.  Gen: resting comfortably, no acute distress HEENT: no scleral icterus, pupils equal round and reactive, no palptable cervical adenopathy,  CV Resp: Clear to auscultation bilaterally GI: abdomen is soft, non-tender, non-distended, normal bowel sounds, no hepatosplenomegaly MSK: extremities are warm, no edema.  Skin: warm, no rash Neuro:  no focal deficits Psych: appropriate affect   Diagnostic Studies  12/12 DSE: Baseline LVEF 55-60%. Negative for ischemia.  07/2012 Carotids: patent bilaterally w/ minimal plaque/hyperplasia, vertebals normal bilateral     01/02/13 EKG: SR, normal axis, no ischemic changes   03/2015 Dobutamine nuclear stress Defect 1: There is a small defect of moderate severity present in the basal inferoseptal, mid inferoseptal, apical septal and apical inferior location. This is most likely due to soft tissue attenuation artifact. However, myocardial scar cannot entirely be ruled out. This is a low risk study. No ischemic territories. Nuclear stress EF: 58%.     09/2017 Dobutamine nuclear stress There was no ST segment deviation noted during stress. Findings consistent with prior inferoseptal infarct without current ischemia. Small apical infarct with mild peri-infarct ischemia. This is a low risk study. The left ventricular ejection fraction is normal (55-65%).     07/2019 nuclear stress Horizontal ST segment depression ST segment depression of 0.5 mm was noted during stress in the V4 and V5 leads. Inversions in leads2, 3, and aVF seen at baseline and slightly more pronounced with dobutamine. Defect 1: There is a large defect of severe severity present in the basal inferoseptal, basal inferior, mid inferoseptal, mid inferior  and apical inferior location. Findings consistent with prior myocardial infarction. No significant ischemic territories. This is a high risk study. Nuclear stress EF: 37%.   09/2019 echo 1. Left ventricular ejection fraction, by estimation, is 40 to 45%. The  left ventricle has moderately decreased function. The left ventricle  demonstrates regional wall motion abnormalities (see scoring  diagram/findings for description). Left ventricular   diastolic parameters are consistent with Grade I diastolic dysfunction  (impaired relaxation).   2. Right ventricular systolic function is normal. The right ventricular  size is normal. There is normal pulmonary artery systolic pressure. The  estimated right ventricular systolic pressure is 47.6 mmHg.   3. Left atrial size was moderately dilated.   4. Right atrial size was mildly dilated.   5. The mitral valve is grossly normal, but mildly thickened with  restriction of posterior leaflet motion. Moderate mitral valve  regurgitation.   6. The aortic valve is tricuspid. Aortic valve regurgitation is trivial.   7. The inferior vena cava is normal in size with greater than 50%  respiratory variability, suggesting right atrial pressure of 3 mmHg.   Comparison(s): Nuclear stress test done 07/20/19 shoqed an EF of 37%.   10/2020 echo IMPRESSIONS     1. Left ventricular ejection fraction, by estimation, is 50%. The left  ventricle has low normal function. The left ventricle has no regional wall  motion abnormalities. Left ventricular diastolic parameters are consistent  with Grade I diastolic  dysfunction (impaired relaxation).   2. Right ventricular systolic function is normal. The right ventricular  size is normal.   3. The mitral valve is normal in structure. Mild mitral valve  regurgitation. No evidence of mitral stenosis.   4. The aortic valve has an indeterminant number of cusps. Aortic valve  regurgitation is not visualized. No aortic stenosis  is present.   5. The inferior vena cava is normal in size with greater than 50%  respiratory variability, suggesting right atrial pressure of 3 mmHg.    Assessment and Plan  1. CAD - 07/2019 stress test without ischemia, - denies any symptoms, continue current meds   2. Chronic systolic HF - did not tolerate very low dose bisoprolol, does not appear will tolerate beta blocker - tolerating losartan '25mg'$  daily - repeat echo shows that LVEF has normalized - no symptoms - continue current meds   3.  Hyperlipdiemia - request pcp labs, continue current meds      Joseph Forbes, M.D., F.A.C.C.

## 2021-11-19 NOTE — Telephone Encounter (Signed)
[name] called and cancelled his appointment with Dr. Harl Bowie. States that he has been diagnosed with lung cancer. He doesn't want to make an appointment as this time.

## 2021-12-17 DIAGNOSIS — E1122 Type 2 diabetes mellitus with diabetic chronic kidney disease: Secondary | ICD-10-CM | POA: Diagnosis not present

## 2021-12-17 DIAGNOSIS — K2100 Gastro-esophageal reflux disease with esophagitis, without bleeding: Secondary | ICD-10-CM | POA: Diagnosis not present

## 2021-12-17 DIAGNOSIS — J441 Chronic obstructive pulmonary disease with (acute) exacerbation: Secondary | ICD-10-CM | POA: Diagnosis not present

## 2022-01-04 DIAGNOSIS — R7989 Other specified abnormal findings of blood chemistry: Secondary | ICD-10-CM | POA: Diagnosis not present

## 2022-01-04 DIAGNOSIS — E1122 Type 2 diabetes mellitus with diabetic chronic kidney disease: Secondary | ICD-10-CM | POA: Diagnosis not present

## 2022-01-04 DIAGNOSIS — I10 Essential (primary) hypertension: Secondary | ICD-10-CM | POA: Diagnosis not present

## 2022-01-04 DIAGNOSIS — E559 Vitamin D deficiency, unspecified: Secondary | ICD-10-CM | POA: Diagnosis not present

## 2022-01-04 DIAGNOSIS — E7849 Other hyperlipidemia: Secondary | ICD-10-CM | POA: Diagnosis not present

## 2022-01-04 DIAGNOSIS — K2100 Gastro-esophageal reflux disease with esophagitis, without bleeding: Secondary | ICD-10-CM | POA: Diagnosis not present

## 2022-01-08 DIAGNOSIS — K2100 Gastro-esophageal reflux disease with esophagitis, without bleeding: Secondary | ICD-10-CM | POA: Diagnosis not present

## 2022-01-08 DIAGNOSIS — J449 Chronic obstructive pulmonary disease, unspecified: Secondary | ICD-10-CM | POA: Diagnosis not present

## 2022-01-08 DIAGNOSIS — E7849 Other hyperlipidemia: Secondary | ICD-10-CM | POA: Diagnosis not present

## 2022-01-08 DIAGNOSIS — I679 Cerebrovascular disease, unspecified: Secondary | ICD-10-CM | POA: Diagnosis not present

## 2022-01-08 DIAGNOSIS — B0229 Other postherpetic nervous system involvement: Secondary | ICD-10-CM | POA: Diagnosis not present

## 2022-01-08 DIAGNOSIS — R7301 Impaired fasting glucose: Secondary | ICD-10-CM | POA: Diagnosis not present

## 2022-01-08 DIAGNOSIS — I2510 Atherosclerotic heart disease of native coronary artery without angina pectoris: Secondary | ICD-10-CM | POA: Diagnosis not present

## 2022-01-08 DIAGNOSIS — E559 Vitamin D deficiency, unspecified: Secondary | ICD-10-CM | POA: Diagnosis not present

## 2022-01-08 DIAGNOSIS — R911 Solitary pulmonary nodule: Secondary | ICD-10-CM | POA: Diagnosis not present

## 2022-01-25 DIAGNOSIS — R030 Elevated blood-pressure reading, without diagnosis of hypertension: Secondary | ICD-10-CM | POA: Diagnosis not present

## 2022-01-25 DIAGNOSIS — F17210 Nicotine dependence, cigarettes, uncomplicated: Secondary | ICD-10-CM | POA: Diagnosis not present

## 2022-01-25 DIAGNOSIS — Z6821 Body mass index (BMI) 21.0-21.9, adult: Secondary | ICD-10-CM | POA: Diagnosis not present

## 2022-01-25 DIAGNOSIS — H6093 Unspecified otitis externa, bilateral: Secondary | ICD-10-CM | POA: Diagnosis not present

## 2022-03-03 DIAGNOSIS — R030 Elevated blood-pressure reading, without diagnosis of hypertension: Secondary | ICD-10-CM | POA: Diagnosis not present

## 2022-03-03 DIAGNOSIS — Z6821 Body mass index (BMI) 21.0-21.9, adult: Secondary | ICD-10-CM | POA: Diagnosis not present

## 2022-03-03 DIAGNOSIS — S43432A Superior glenoid labrum lesion of left shoulder, initial encounter: Secondary | ICD-10-CM | POA: Diagnosis not present

## 2022-03-03 DIAGNOSIS — S29012A Strain of muscle and tendon of back wall of thorax, initial encounter: Secondary | ICD-10-CM | POA: Diagnosis not present

## 2022-03-03 DIAGNOSIS — F17210 Nicotine dependence, cigarettes, uncomplicated: Secondary | ICD-10-CM | POA: Diagnosis not present

## 2022-03-18 DIAGNOSIS — F17210 Nicotine dependence, cigarettes, uncomplicated: Secondary | ICD-10-CM | POA: Diagnosis not present

## 2022-03-18 DIAGNOSIS — K2100 Gastro-esophageal reflux disease with esophagitis, without bleeding: Secondary | ICD-10-CM | POA: Diagnosis not present

## 2022-03-18 DIAGNOSIS — J441 Chronic obstructive pulmonary disease with (acute) exacerbation: Secondary | ICD-10-CM | POA: Diagnosis not present

## 2022-03-18 DIAGNOSIS — E1122 Type 2 diabetes mellitus with diabetic chronic kidney disease: Secondary | ICD-10-CM | POA: Diagnosis not present

## 2022-03-18 DIAGNOSIS — Z6821 Body mass index (BMI) 21.0-21.9, adult: Secondary | ICD-10-CM | POA: Diagnosis not present

## 2022-03-18 DIAGNOSIS — R911 Solitary pulmonary nodule: Secondary | ICD-10-CM | POA: Diagnosis not present

## 2022-03-18 DIAGNOSIS — M7550 Bursitis of unspecified shoulder: Secondary | ICD-10-CM | POA: Diagnosis not present

## 2022-03-18 DIAGNOSIS — R030 Elevated blood-pressure reading, without diagnosis of hypertension: Secondary | ICD-10-CM | POA: Diagnosis not present

## 2022-03-18 DIAGNOSIS — M25512 Pain in left shoulder: Secondary | ICD-10-CM | POA: Diagnosis not present

## 2022-04-06 DIAGNOSIS — M7550 Bursitis of unspecified shoulder: Secondary | ICD-10-CM | POA: Diagnosis not present

## 2022-04-06 DIAGNOSIS — Z6821 Body mass index (BMI) 21.0-21.9, adult: Secondary | ICD-10-CM | POA: Diagnosis not present

## 2022-04-06 DIAGNOSIS — R030 Elevated blood-pressure reading, without diagnosis of hypertension: Secondary | ICD-10-CM | POA: Diagnosis not present

## 2022-04-06 DIAGNOSIS — F17210 Nicotine dependence, cigarettes, uncomplicated: Secondary | ICD-10-CM | POA: Diagnosis not present

## 2022-04-06 DIAGNOSIS — M25512 Pain in left shoulder: Secondary | ICD-10-CM | POA: Diagnosis not present

## 2022-04-06 DIAGNOSIS — R911 Solitary pulmonary nodule: Secondary | ICD-10-CM | POA: Diagnosis not present

## 2022-04-25 DIAGNOSIS — R0789 Other chest pain: Secondary | ICD-10-CM | POA: Diagnosis not present

## 2022-04-25 DIAGNOSIS — E7800 Pure hypercholesterolemia, unspecified: Secondary | ICD-10-CM | POA: Diagnosis not present

## 2022-04-25 DIAGNOSIS — Z66 Do not resuscitate: Secondary | ICD-10-CM | POA: Diagnosis not present

## 2022-04-25 DIAGNOSIS — K219 Gastro-esophageal reflux disease without esophagitis: Secondary | ICD-10-CM | POA: Diagnosis not present

## 2022-04-25 DIAGNOSIS — Z720 Tobacco use: Secondary | ICD-10-CM | POA: Diagnosis not present

## 2022-04-25 DIAGNOSIS — F17210 Nicotine dependence, cigarettes, uncomplicated: Secondary | ICD-10-CM | POA: Diagnosis not present

## 2022-04-25 DIAGNOSIS — R0600 Dyspnea, unspecified: Secondary | ICD-10-CM | POA: Diagnosis not present

## 2022-04-25 DIAGNOSIS — J441 Chronic obstructive pulmonary disease with (acute) exacerbation: Secondary | ICD-10-CM | POA: Diagnosis not present

## 2022-04-25 DIAGNOSIS — I2510 Atherosclerotic heart disease of native coronary artery without angina pectoris: Secondary | ICD-10-CM | POA: Diagnosis not present

## 2022-04-25 DIAGNOSIS — R9431 Abnormal electrocardiogram [ECG] [EKG]: Secondary | ICD-10-CM | POA: Diagnosis not present

## 2022-04-25 DIAGNOSIS — J439 Emphysema, unspecified: Secondary | ICD-10-CM | POA: Diagnosis not present

## 2022-04-25 DIAGNOSIS — Z79899 Other long term (current) drug therapy: Secondary | ICD-10-CM | POA: Diagnosis not present

## 2022-06-18 DEATH — deceased
